# Patient Record
Sex: Male | Born: 1940
Health system: Southern US, Community
[De-identification: ages and names within clinical notes are randomized; demographics above are authoritative.]

## PROBLEM LIST (undated history)

## (undated) DIAGNOSIS — J449 Chronic obstructive pulmonary disease, unspecified: Secondary | ICD-10-CM

## (undated) DIAGNOSIS — I10 Essential (primary) hypertension: Secondary | ICD-10-CM

## (undated) DIAGNOSIS — J45909 Unspecified asthma, uncomplicated: Secondary | ICD-10-CM

## (undated) DIAGNOSIS — R918 Other nonspecific abnormal finding of lung field: Secondary | ICD-10-CM

## (undated) DIAGNOSIS — M1990 Unspecified osteoarthritis, unspecified site: Secondary | ICD-10-CM

## (undated) DIAGNOSIS — D509 Iron deficiency anemia, unspecified: Secondary | ICD-10-CM

## (undated) HISTORY — PX: HAMMER TOE SURGERY: SHX385

## (undated) HISTORY — DX: Other nonspecific abnormal finding of lung field: R91.8

## (undated) HISTORY — DX: Chronic obstructive pulmonary disease, unspecified: J44.9

## (undated) HISTORY — DX: Unspecified osteoarthritis, unspecified site: M19.90

## (undated) HISTORY — DX: Iron deficiency anemia, unspecified: D50.9

## (undated) HISTORY — DX: Essential (primary) hypertension: I10

---

## 1993-03-28 HISTORY — PX: ABDOMINAL HERNIA REPAIR: SHX539

## 2004-03-25 ENCOUNTER — Other Ambulatory Visit: Payer: Self-pay

## 2004-03-25 ENCOUNTER — Ambulatory Visit: Payer: Self-pay | Admitting: Unknown Physician Specialty

## 2004-03-30 ENCOUNTER — Ambulatory Visit: Payer: Self-pay | Admitting: Unknown Physician Specialty

## 2004-05-25 ENCOUNTER — Encounter: Payer: Self-pay | Admitting: Physician Assistant

## 2004-05-26 ENCOUNTER — Encounter: Payer: Self-pay | Admitting: Physician Assistant

## 2004-06-26 ENCOUNTER — Encounter: Payer: Self-pay | Admitting: Physician Assistant

## 2004-08-05 ENCOUNTER — Ambulatory Visit: Payer: Self-pay | Admitting: Internal Medicine

## 2005-02-01 ENCOUNTER — Ambulatory Visit: Payer: Self-pay | Admitting: Internal Medicine

## 2005-06-22 ENCOUNTER — Ambulatory Visit: Payer: Self-pay | Admitting: Internal Medicine

## 2005-12-23 ENCOUNTER — Ambulatory Visit: Payer: Self-pay

## 2006-02-06 ENCOUNTER — Ambulatory Visit: Payer: Self-pay | Admitting: Internal Medicine

## 2007-04-11 ENCOUNTER — Emergency Department: Payer: Self-pay | Admitting: Emergency Medicine

## 2008-01-02 ENCOUNTER — Other Ambulatory Visit: Payer: Self-pay

## 2008-01-02 ENCOUNTER — Ambulatory Visit: Payer: Self-pay | Admitting: Ophthalmology

## 2008-01-15 ENCOUNTER — Ambulatory Visit: Payer: Self-pay | Admitting: Ophthalmology

## 2008-06-24 ENCOUNTER — Ambulatory Visit: Payer: Self-pay | Admitting: Gastroenterology

## 2009-02-06 ENCOUNTER — Ambulatory Visit: Payer: Self-pay | Admitting: Family

## 2009-05-15 ENCOUNTER — Ambulatory Visit: Payer: Self-pay | Admitting: Internal Medicine

## 2009-05-28 ENCOUNTER — Ambulatory Visit: Payer: Self-pay | Admitting: Podiatry

## 2009-06-05 ENCOUNTER — Ambulatory Visit: Payer: Self-pay | Admitting: Podiatry

## 2009-12-06 LAB — HM COLONOSCOPY: HM Colonoscopy: NORMAL

## 2010-04-22 ENCOUNTER — Ambulatory Visit: Payer: Self-pay | Admitting: Internal Medicine

## 2010-05-18 ENCOUNTER — Inpatient Hospital Stay: Payer: Self-pay | Admitting: Podiatry

## 2010-05-18 ENCOUNTER — Other Ambulatory Visit: Payer: Self-pay | Admitting: Podiatry

## 2010-05-22 LAB — PATHOLOGY REPORT

## 2010-09-13 ENCOUNTER — Ambulatory Visit: Payer: Self-pay | Admitting: Podiatry

## 2010-12-24 ENCOUNTER — Other Ambulatory Visit: Payer: Self-pay | Admitting: Internal Medicine

## 2010-12-24 NOTE — Telephone Encounter (Signed)
Called Rx in and advised pt.

## 2010-12-24 NOTE — Telephone Encounter (Signed)
Called in Rx to [facility] [address].

## 2010-12-24 NOTE — Telephone Encounter (Signed)
Fine to call in these refills for him.

## 2011-02-02 ENCOUNTER — Other Ambulatory Visit: Payer: Self-pay | Admitting: Internal Medicine

## 2011-02-08 ENCOUNTER — Ambulatory Visit: Payer: Self-pay | Admitting: Ophthalmology

## 2011-02-14 ENCOUNTER — Ambulatory Visit: Payer: Self-pay | Admitting: Ophthalmology

## 2011-04-05 DIAGNOSIS — L97509 Non-pressure chronic ulcer of other part of unspecified foot with unspecified severity: Secondary | ICD-10-CM | POA: Diagnosis not present

## 2011-04-05 DIAGNOSIS — G5790 Unspecified mononeuropathy of unspecified lower limb: Secondary | ICD-10-CM | POA: Diagnosis not present

## 2011-04-28 ENCOUNTER — Inpatient Hospital Stay: Payer: Self-pay | Admitting: Internal Medicine

## 2011-04-28 DIAGNOSIS — R0989 Other specified symptoms and signs involving the circulatory and respiratory systems: Secondary | ICD-10-CM | POA: Diagnosis not present

## 2011-04-28 DIAGNOSIS — R0609 Other forms of dyspnea: Secondary | ICD-10-CM | POA: Diagnosis not present

## 2011-04-28 DIAGNOSIS — R0602 Shortness of breath: Secondary | ICD-10-CM | POA: Diagnosis not present

## 2011-04-28 DIAGNOSIS — R6889 Other general symptoms and signs: Secondary | ICD-10-CM | POA: Diagnosis not present

## 2011-04-28 DIAGNOSIS — R0902 Hypoxemia: Secondary | ICD-10-CM | POA: Diagnosis not present

## 2011-04-28 DIAGNOSIS — M542 Cervicalgia: Secondary | ICD-10-CM | POA: Diagnosis not present

## 2011-04-28 DIAGNOSIS — J438 Other emphysema: Secondary | ICD-10-CM | POA: Diagnosis not present

## 2011-04-28 DIAGNOSIS — J449 Chronic obstructive pulmonary disease, unspecified: Secondary | ICD-10-CM | POA: Diagnosis not present

## 2011-04-28 DIAGNOSIS — J9620 Acute and chronic respiratory failure, unspecified whether with hypoxia or hypercapnia: Secondary | ICD-10-CM | POA: Diagnosis not present

## 2011-04-28 DIAGNOSIS — J40 Bronchitis, not specified as acute or chronic: Secondary | ICD-10-CM | POA: Diagnosis not present

## 2011-04-28 LAB — CBC
HCT: 33.9 % — ABNORMAL LOW (ref 40.0–52.0)
MCV: 78 fL — ABNORMAL LOW (ref 80–100)
RBC: 4.37 10*6/uL — ABNORMAL LOW (ref 4.40–5.90)
RDW: 18.1 % — ABNORMAL HIGH (ref 11.5–14.5)
WBC: 12.1 10*3/uL — ABNORMAL HIGH (ref 3.8–10.6)

## 2011-04-28 LAB — COMPREHENSIVE METABOLIC PANEL
Albumin: 3.2 g/dL — ABNORMAL LOW (ref 3.4–5.0)
BUN: 20 mg/dL — ABNORMAL HIGH (ref 7–18)
Bilirubin,Total: 0.2 mg/dL (ref 0.2–1.0)
Chloride: 103 mmol/L (ref 98–107)
Co2: 28 mmol/L (ref 21–32)
Creatinine: 0.96 mg/dL (ref 0.60–1.30)
EGFR (African American): >60
Glucose: 87 mg/dL (ref 65–99)
Osmolality: 281 (ref 275–301)
Potassium: 3.8 mmol/L (ref 3.5–5.1)
Sodium: 140 mmol/L (ref 136–145)
Total Protein: 8.3 g/dL — ABNORMAL HIGH (ref 6.4–8.2)

## 2011-04-28 LAB — CK TOTAL AND CKMB (NOT AT ARMC): CK-MB: 1.3 ng/mL (ref 0.5–3.6)

## 2011-04-29 DIAGNOSIS — J9620 Acute and chronic respiratory failure, unspecified whether with hypoxia or hypercapnia: Secondary | ICD-10-CM | POA: Diagnosis not present

## 2011-04-29 DIAGNOSIS — R0902 Hypoxemia: Secondary | ICD-10-CM | POA: Diagnosis not present

## 2011-04-29 DIAGNOSIS — J40 Bronchitis, not specified as acute or chronic: Secondary | ICD-10-CM | POA: Diagnosis not present

## 2011-04-29 DIAGNOSIS — J438 Other emphysema: Secondary | ICD-10-CM | POA: Diagnosis not present

## 2011-04-30 DIAGNOSIS — J9620 Acute and chronic respiratory failure, unspecified whether with hypoxia or hypercapnia: Secondary | ICD-10-CM | POA: Diagnosis not present

## 2011-04-30 DIAGNOSIS — J40 Bronchitis, not specified as acute or chronic: Secondary | ICD-10-CM | POA: Diagnosis not present

## 2011-04-30 DIAGNOSIS — R0902 Hypoxemia: Secondary | ICD-10-CM | POA: Diagnosis not present

## 2011-04-30 DIAGNOSIS — I10 Essential (primary) hypertension: Secondary | ICD-10-CM | POA: Diagnosis not present

## 2011-04-30 DIAGNOSIS — I509 Heart failure, unspecified: Secondary | ICD-10-CM | POA: Diagnosis not present

## 2011-04-30 DIAGNOSIS — J441 Chronic obstructive pulmonary disease with (acute) exacerbation: Secondary | ICD-10-CM | POA: Diagnosis not present

## 2011-04-30 LAB — CBC WITH DIFFERENTIAL/PLATELET
Basophil #: 0 10*3/uL (ref 0.0–0.1)
Eosinophil #: 0 10*3/uL (ref 0.0–0.7)
Eosinophil %: 0 %
HCT: 28.4 % — ABNORMAL LOW (ref 40.0–52.0)
Lymphocyte #: 0.6 10*3/uL — ABNORMAL LOW (ref 1.0–3.6)
Lymphocyte %: 4.8 %
MCV: 78 fL — ABNORMAL LOW (ref 80–100)
Monocyte #: 0.2 10*3/uL (ref 0.0–0.7)
Monocyte %: 1.8 %
Neutrophil %: 93.4 %
Platelet: 288 10*3/uL (ref 150–440)
RBC: 3.66 10*6/uL — ABNORMAL LOW (ref 4.40–5.90)
RDW: 18.1 % — ABNORMAL HIGH (ref 11.5–14.5)
WBC: 11.9 10*3/uL — ABNORMAL HIGH (ref 3.8–10.6)

## 2011-04-30 LAB — BASIC METABOLIC PANEL
BUN: 19 mg/dL — ABNORMAL HIGH (ref 7–18)
Calcium, Total: 9.2 mg/dL (ref 8.5–10.1)
Chloride: 104 mmol/L (ref 98–107)
Co2: 29 mmol/L (ref 21–32)
Creatinine: 0.91 mg/dL (ref 0.60–1.30)
Potassium: 4.3 mmol/L (ref 3.5–5.1)
Sodium: 140 mmol/L (ref 136–145)

## 2011-04-30 LAB — IRON AND TIBC
Iron Bind.Cap.(Total): 441 ug/dL (ref 250–450)
Iron Saturation: 5 %
Iron: 21 ug/dL — ABNORMAL LOW (ref 65–175)
Unbound Iron-Bind.Cap.: 420 ug/dL

## 2011-04-30 LAB — FERRITIN: Ferritin (ARMC): 26 ng/mL (ref 8–388)

## 2011-05-01 DIAGNOSIS — J441 Chronic obstructive pulmonary disease with (acute) exacerbation: Secondary | ICD-10-CM | POA: Diagnosis not present

## 2011-05-01 DIAGNOSIS — I10 Essential (primary) hypertension: Secondary | ICD-10-CM | POA: Diagnosis not present

## 2011-05-01 DIAGNOSIS — J9620 Acute and chronic respiratory failure, unspecified whether with hypoxia or hypercapnia: Secondary | ICD-10-CM | POA: Diagnosis not present

## 2011-05-01 DIAGNOSIS — J40 Bronchitis, not specified as acute or chronic: Secondary | ICD-10-CM | POA: Diagnosis not present

## 2011-05-01 DIAGNOSIS — R0902 Hypoxemia: Secondary | ICD-10-CM | POA: Diagnosis not present

## 2011-05-03 LAB — CULTURE, BLOOD (SINGLE)

## 2011-05-03 LAB — EXPECTORATED SPUTUM ASSESSMENT W GRAM STAIN, RFLX TO RESP C

## 2011-05-05 DIAGNOSIS — J449 Chronic obstructive pulmonary disease, unspecified: Secondary | ICD-10-CM | POA: Diagnosis not present

## 2011-05-05 DIAGNOSIS — J438 Other emphysema: Secondary | ICD-10-CM | POA: Diagnosis not present

## 2011-05-05 DIAGNOSIS — G4710 Hypersomnia, unspecified: Secondary | ICD-10-CM | POA: Diagnosis not present

## 2011-05-05 DIAGNOSIS — F17200 Nicotine dependence, unspecified, uncomplicated: Secondary | ICD-10-CM | POA: Diagnosis not present

## 2011-05-05 DIAGNOSIS — J9610 Chronic respiratory failure, unspecified whether with hypoxia or hypercapnia: Secondary | ICD-10-CM | POA: Diagnosis not present

## 2011-05-16 ENCOUNTER — Other Ambulatory Visit: Payer: Self-pay | Admitting: Internal Medicine

## 2011-05-30 DIAGNOSIS — J449 Chronic obstructive pulmonary disease, unspecified: Secondary | ICD-10-CM | POA: Diagnosis not present

## 2011-05-30 DIAGNOSIS — J9610 Chronic respiratory failure, unspecified whether with hypoxia or hypercapnia: Secondary | ICD-10-CM | POA: Diagnosis not present

## 2011-05-30 DIAGNOSIS — F17200 Nicotine dependence, unspecified, uncomplicated: Secondary | ICD-10-CM | POA: Diagnosis not present

## 2011-05-30 DIAGNOSIS — G4710 Hypersomnia, unspecified: Secondary | ICD-10-CM | POA: Diagnosis not present

## 2011-05-30 DIAGNOSIS — J438 Other emphysema: Secondary | ICD-10-CM | POA: Diagnosis not present

## 2011-06-03 DIAGNOSIS — R350 Frequency of micturition: Secondary | ICD-10-CM | POA: Diagnosis not present

## 2011-06-03 DIAGNOSIS — M069 Rheumatoid arthritis, unspecified: Secondary | ICD-10-CM | POA: Diagnosis not present

## 2011-06-03 DIAGNOSIS — J449 Chronic obstructive pulmonary disease, unspecified: Secondary | ICD-10-CM | POA: Diagnosis not present

## 2011-06-03 DIAGNOSIS — N390 Urinary tract infection, site not specified: Secondary | ICD-10-CM | POA: Diagnosis not present

## 2011-06-07 ENCOUNTER — Encounter: Payer: Self-pay | Admitting: Internal Medicine

## 2011-06-07 ENCOUNTER — Ambulatory Visit (INDEPENDENT_AMBULATORY_CARE_PROVIDER_SITE_OTHER): Payer: Medicare Other | Admitting: Internal Medicine

## 2011-06-07 VITALS — BP 90/50 | HR 89 | Temp 98.4°F | Ht 68.0 in | Wt 162.0 lb

## 2011-06-07 DIAGNOSIS — R109 Unspecified abdominal pain: Secondary | ICD-10-CM

## 2011-06-07 DIAGNOSIS — J449 Chronic obstructive pulmonary disease, unspecified: Secondary | ICD-10-CM | POA: Insufficient documentation

## 2011-06-07 DIAGNOSIS — M069 Rheumatoid arthritis, unspecified: Secondary | ICD-10-CM

## 2011-06-07 DIAGNOSIS — N401 Enlarged prostate with lower urinary tract symptoms: Secondary | ICD-10-CM | POA: Diagnosis not present

## 2011-06-07 DIAGNOSIS — D510 Vitamin B12 deficiency anemia due to intrinsic factor deficiency: Secondary | ICD-10-CM | POA: Diagnosis not present

## 2011-06-07 DIAGNOSIS — E039 Hypothyroidism, unspecified: Secondary | ICD-10-CM

## 2011-06-07 DIAGNOSIS — R350 Frequency of micturition: Secondary | ICD-10-CM

## 2011-06-07 DIAGNOSIS — I10 Essential (primary) hypertension: Secondary | ICD-10-CM | POA: Insufficient documentation

## 2011-06-07 LAB — CBC WITH DIFFERENTIAL/PLATELET
Basophils Relative: 0.4 % (ref 0.0–3.0)
Eosinophils Relative: 0.7 % (ref 0.0–5.0)
Hemoglobin: 10.3 g/dL — ABNORMAL LOW (ref 13.0–17.0)
Lymphocytes Relative: 12.4 % (ref 12.0–46.0)
MCV: 76.6 fl — ABNORMAL LOW (ref 78.0–100.0)
Monocytes Absolute: 1 10*3/uL (ref 0.1–1.0)
Neutro Abs: 7.5 10*3/uL (ref 1.4–7.7)
Neutrophils Relative %: 76.6 % (ref 43.0–77.0)
RBC: 4.19 Mil/uL — ABNORMAL LOW (ref 4.22–5.81)
WBC: 9.8 10*3/uL (ref 4.5–10.5)

## 2011-06-07 LAB — TSH: TSH: 1.41 u[IU]/mL (ref 0.35–5.50)

## 2011-06-07 LAB — VITAMIN B12: Vitamin B-12: 284 pg/mL (ref 211–911)

## 2011-06-07 LAB — COMPREHENSIVE METABOLIC PANEL
Albumin: 3.3 g/dL — ABNORMAL LOW (ref 3.5–5.2)
BUN: 20 mg/dL (ref 6–23)
Calcium: 9.2 mg/dL (ref 8.4–10.5)
Chloride: 99 mEq/L (ref 96–112)
Creatinine, Ser: 0.8 mg/dL (ref 0.4–1.5)
Glucose, Bld: 125 mg/dL — ABNORMAL HIGH (ref 70–99)
Potassium: 3.9 mEq/L (ref 3.5–5.1)

## 2011-06-07 MED ORDER — ATENOLOL 12.5 MG HALF TABLET: 12.5000 mg | ORAL_TABLET | Freq: Every day | ORAL | Status: DC

## 2011-06-07 MED ORDER — PREDNISONE (PAK) 10 MG PO TABS: ORAL_TABLET | ORAL | Status: AC

## 2011-06-07 NOTE — Assessment & Plan Note (Signed)
Patient reports pain has resolved. Will recheck urine today. If any blood in the urine, would like to get CT of the abdomen to look for renal stone.

## 2011-06-07 NOTE — Assessment & Plan Note (Signed)
Patient with rheumatoid arthritis. Has significant swelling in his MTP joints in his hands. Will treat with prednisone burst. He has followup with his rheumatologist next month.

## 2011-06-07 NOTE — Assessment & Plan Note (Signed)
Congratulated him on smoking cessation. We'll continue current medications. Will continue home oxygen. Followup 2 weeks.

## 2011-06-07 NOTE — Patient Instructions (Signed)
Decrease Atenolol to 12.5mg  (half tablet) daily.  Follow up 2 weeks.

## 2011-06-07 NOTE — Progress Notes (Signed)
Subjective:    Patient ID: Peter Johnston, male    DOB: Jun 05, 1940, 71 y.o.   MRN: [id number]  HPI 71 year old male with history of COPD, rheumatoid arthritis, chronic lower extremity ulcers, hypertension presents for followup. He reports that he has recently been suffering from left-sided flank and lower back pain. He was seen at urgent care and was started on doxycycline for suspected urinary tract infection. He reports complete resolution of his symptoms with this. He has not taken pain medication for this.  In regards to his COPD, he notes that he recently quit smoking. He reports full compliance with his inhalers. He reports that he is doing well using 3 L of oxygen 24 hours per day at home.  He is concerned today because he sometimes has swelling in both of his lower extremities. He denies pain in his lower extremities. He is not currently taking any diuretics. He does have compression stockings at home but does not wear these on a regular basis.  In regards to his rheumatoid arthritis, he notes he has been taking Plaquenil only once daily. He reports worsening pain in the joints of his hands. This is been going on for the last couple of weeks. He noted some improvement while on steroids during his recent hospitalization. He denies fever or chills.  Outpatient Encounter Prescriptions as of 06/07/2011  Medication Sig Dispense Refill  . allopurinol (ZYLOPRIM) 300 MG tablet Take 300 mg by mouth daily.      Marland Kitchen atenolol (TENORMIN) 12.5 mg TABS Take 0.5 tablets (12.5 mg total) by mouth daily.  90 tablet  5  . esomeprazole (NEXIUM) 40 MG capsule Take 40 mg by mouth daily before breakfast.      . Fluticasone-Salmeterol (ADVAIR) 250-50 MCG/DOSE AEPB Inhale 1 puff into the lungs every 12 (twelve) hours.      . hydroxychloroquine (PLAQUENIL) 200 MG tablet Take 200 mg by mouth daily.      Marland Kitchen ipratropium (ATROVENT) 0.02 % nebulizer solution Take 500 mcg by nebulization 2 (two) times daily as needed.      .  theophylline (THEO-24) 100 MG 24 hr capsule Take 100 mg by mouth daily.      Marland Kitchen tiotropium (SPIRIVA) 18 MCG inhalation capsule Place 18 mcg into inhaler and inhale daily.      . traMADol (ULTRAM) 50 MG tablet Take 50 mg by mouth every 6 (six) hours as needed.      . TRICOR 145 MG tablet TAKE 1 TABLET DAILY  90 tablet  2  . DISCONTD: atenolol (TENORMIN) 25 MG tablet TAKE 1 TABLET DAILY  90 tablet  5  . predniSONE (STERAPRED UNI-PAK) 10 MG tablet Take 60mg  day 1 then taper by 10mg  daily  21 tablet  0    Review of Systems  Constitutional: Positive for fatigue. Negative for fever, chills, activity change, appetite change and unexpected weight change.  Eyes: Negative for visual disturbance.  Respiratory: Positive for cough and shortness of breath.   Cardiovascular: Positive for leg swelling. Negative for chest pain and palpitations.  Gastrointestinal: Positive for abdominal pain. Negative for abdominal distention.  Genitourinary: Positive for flank pain. Negative for dysuria, urgency and difficulty urinating.  Musculoskeletal: Negative for arthralgias and gait problem.  Skin: Negative for color change and rash.  Hematological: Negative for adenopathy.  Psychiatric/Behavioral: Negative for sleep disturbance and dysphoric mood. The patient is not nervous/anxious.    BP 90/50  Pulse 89  Temp(Src) 98.4 F (36.9 C) (Oral)  Ht 5\' 8"  (1.727 m)  Wt 162 lb (73.483 kg)  BMI 24.63 kg/m2  SpO2 93%     Objective:   Physical Exam  Constitutional: He is oriented to person, place, and time. He appears well-developed and well-nourished. No distress.  HENT:  Head: Normocephalic and atraumatic.  Right Ear: External ear normal.  Left Ear: External ear normal.  Nose: Nose normal.  Mouth/Throat: Oropharynx is clear and moist. No oropharyngeal exudate.  Eyes: Conjunctivae and EOM are normal. Pupils are equal, round, and reactive to light. Right eye exhibits no discharge. Left eye exhibits no discharge. No  scleral icterus.  Neck: Normal range of motion. Neck supple. No tracheal deviation present. No thyromegaly present.  Cardiovascular: Normal rate, regular rhythm and normal heart sounds.  Exam reveals no gallop and no friction rub.   No murmur heard. Pulmonary/Chest: Effort normal. No accessory muscle usage. Not tachypneic. No respiratory distress. He has decreased breath sounds. He has no wheezes. He has no rhonchi. He has no rales. He exhibits no tenderness.  Abdominal: There is no CVA tenderness.  Musculoskeletal: Normal range of motion. He exhibits edema (trace bilateral LE to mid shin).  Lymphadenopathy:    He has no cervical adenopathy.  Neurological: He is alert and oriented to person, place, and time. No cranial nerve deficit. Coordination normal.  Skin: Skin is warm and dry. No rash noted. He is not diaphoretic. No erythema. No pallor.  Psychiatric: He has a normal mood and affect. His behavior is normal. Judgment and thought content normal.          Assessment & Plan:

## 2011-06-07 NOTE — Assessment & Plan Note (Signed)
Patient is hypotensive today. We'll reduce dose of atenolol to 12.5 mg daily. We'll check renal function with labs today. He will followup in 2 weeks.

## 2011-06-08 DIAGNOSIS — R109 Unspecified abdominal pain: Secondary | ICD-10-CM | POA: Diagnosis not present

## 2011-06-08 LAB — POCT URINALYSIS DIPSTICK
Glucose, UA: NEGATIVE
Leukocytes, UA: NEGATIVE
Nitrite, UA: NEGATIVE

## 2011-06-08 NOTE — Progress Notes (Signed)
Addended by: Jobie Quaker on: 06/08/2011 05:04 PM   Modules accepted: Orders

## 2011-06-23 ENCOUNTER — Telehealth: Payer: Self-pay | Admitting: Internal Medicine

## 2011-06-23 NOTE — Telephone Encounter (Signed)
We should set him up a visit to discuss that.

## 2011-06-23 NOTE — Telephone Encounter (Signed)
Patient is needing more prednisone wanting to know if he can be on it on a regular basis at a lower dosage to help with his breathing and arthritis .

## 2011-06-24 ENCOUNTER — Other Ambulatory Visit: Payer: Self-pay | Admitting: Internal Medicine

## 2011-06-27 NOTE — Telephone Encounter (Signed)
MD already refused request by pharm w/mess that pharm needs an apt. [place] Noe, can you help get pt set up for f/u OV to discuss this request? THANKS!!!!!

## 2011-06-28 NOTE — Telephone Encounter (Signed)
Left a message for the patient to call the office to set up an appointment to discuss him being on prednisone on a regular basis.

## 2011-06-28 NOTE — Telephone Encounter (Signed)
Patient will be seen on 4.3.13 @ 8:15 . Patient also needs to sign a DPR.

## 2011-06-29 ENCOUNTER — Encounter: Payer: Self-pay | Admitting: Internal Medicine

## 2011-06-29 ENCOUNTER — Ambulatory Visit (INDEPENDENT_AMBULATORY_CARE_PROVIDER_SITE_OTHER): Payer: Medicare Other | Admitting: Internal Medicine

## 2011-06-29 ENCOUNTER — Telehealth: Payer: Self-pay | Admitting: Internal Medicine

## 2011-06-29 VITALS — BP 110/60 | HR 96 | Temp 97.9°F | Ht 68.0 in | Wt 159.0 lb

## 2011-06-29 DIAGNOSIS — M069 Rheumatoid arthritis, unspecified: Secondary | ICD-10-CM

## 2011-06-29 DIAGNOSIS — D509 Iron deficiency anemia, unspecified: Secondary | ICD-10-CM

## 2011-06-29 DIAGNOSIS — J449 Chronic obstructive pulmonary disease, unspecified: Secondary | ICD-10-CM

## 2011-06-29 DIAGNOSIS — K219 Gastro-esophageal reflux disease without esophagitis: Secondary | ICD-10-CM

## 2011-06-29 DIAGNOSIS — N401 Enlarged prostate with lower urinary tract symptoms: Secondary | ICD-10-CM

## 2011-06-29 MED ORDER — PREDNISONE 10 MG PO TABS: ORAL_TABLET | ORAL | Status: DC

## 2011-06-29 MED ORDER — OMEPRAZOLE 40 MG PO CPDR: 40.0000 mg | DELAYED_RELEASE_CAPSULE | Freq: Every day | ORAL | Status: DC

## 2011-06-29 NOTE — Assessment & Plan Note (Signed)
Persistent pain in joints of hands, knees and ankles.  Some improvement with prednisone, but likely needs longer taper for better control. We discussed significant risks of long term prednisone use. He will need to resume PPI. Unable to afford Nexium, so will start Omeprazole.  Has follow up with rheumatologist next month.

## 2011-06-29 NOTE — Assessment & Plan Note (Signed)
Patient is not taking Nexium because of cost. Will change to omeprazole 40 mg daily. As below, will set up followup with GI medicine.

## 2011-06-29 NOTE — Assessment & Plan Note (Signed)
Symptoms stable. Has follow up with pulmonologist tomorrow.

## 2011-06-29 NOTE — Assessment & Plan Note (Signed)
Stool Hemoccult is pending. Will set up with GI for evaluation likely to include upper endoscopy given patient's history of gastric ulcer and chronic use of prednisone, he is at high risk for recurrence.

## 2011-06-29 NOTE — Assessment & Plan Note (Signed)
Symptoms worsening. PSA was normal. Will set up with urology for follow up.

## 2011-06-29 NOTE — Progress Notes (Signed)
Subjective:    Patient ID: Peter Johnston, male    DOB: [birth year], 71 y.o.   MRN: [id number]  HPI 71 year old male with history of COPD, GERD and gastric ulcer, iron deficiency anemia, and rheumatoid arthritis presents for followup. He reports significant pain in the joints of his hands, ankles, and knees. This is consistent with his chronic history of rheumatoid arthritis and a recent flare. He reports some improvement with use of prednisone in the past and would like to restart this medication. He notes that he was on long-term prednisone in the past. He is currently having trouble functioning and caring for himself because of severe pain in his joints.  In regards to his COPD, he reports that his symptoms have been stable. He continues to have cough productive of large amount of sputum in the mornings. He denies any change in shortness of breath or wheezing. Notably, he stopped his theophylline.  In regards to his GERD, he notes he is no longer taking Nexium because of cost. He would like to change to another medication. He notes worsening of his symptoms including epigastric pain and reflux. He notes a history of bleeding gastric ulcer in the past. He has not recently had endoscopy. He was noted on labs to have anemia in stool Hemoccult is pending. He has not noted any black stool or change in his stool pattern. He does report generalized fatigue.  Outpatient Encounter Prescriptions as of 06/29/2011  Medication Sig Dispense Refill  . allopurinol (ZYLOPRIM) 300 MG tablet Take 300 mg by mouth daily.      Marland Kitchen atenolol (TENORMIN) 12.5 mg TABS Take 0.5 tablets (12.5 mg total) by mouth daily.  90 tablet  5  . Fluticasone-Salmeterol (ADVAIR) 250-50 MCG/DOSE AEPB Inhale 1 puff into the lungs every 12 (twelve) hours.      . hydroxychloroquine (PLAQUENIL) 200 MG tablet Take 200 mg by mouth daily.      Marland Kitchen ipratropium (ATROVENT) 0.02 % nebulizer solution Take 500 mcg by nebulization 2 (two) times daily as  needed.      . tiotropium (SPIRIVA) 18 MCG inhalation capsule Place 18 mcg into inhaler and inhale daily.      . traMADol (ULTRAM) 50 MG tablet Take 50 mg by mouth every 6 (six) hours as needed.      . TRICOR 145 MG tablet TAKE 1 TABLET DAILY  90 tablet  2  . omeprazole (PRILOSEC) 40 MG capsule Take 1 capsule (40 mg total) by mouth daily.  30 capsule  3  . predniSONE (DELTASONE) 10 MG tablet Take 20mg  (2 pills) daily x 5 days, then 10mg  daily x 5 days, then 5mg  daily x 10 days. Then, stop.  20 tablet  0  . theophylline (THEO-24) 100 MG 24 hr capsule Take 100 mg by mouth daily.      Marland Kitchen DISCONTD: esomeprazole (NEXIUM) 40 MG capsule Take 40 mg by mouth daily before breakfast.        Review of Systems  Constitutional: Negative for fever, chills, activity change, appetite change, fatigue and unexpected weight change.  Eyes: Negative for visual disturbance.  Respiratory: Positive for cough, shortness of breath and wheezing.   Cardiovascular: Negative for chest pain, palpitations and leg swelling.  Gastrointestinal: Positive for abdominal pain. Negative for nausea, diarrhea, constipation, blood in stool, abdominal distention, anal bleeding and rectal pain.  Genitourinary: Positive for frequency. Negative for dysuria, urgency and difficulty urinating.  Musculoskeletal: Positive for myalgias, back pain, joint swelling and arthralgias. Negative for gait problem.  Skin: Negative for color change and rash.  Hematological: Negative for adenopathy.  Psychiatric/Behavioral: Negative for sleep disturbance and dysphoric mood. The patient is not nervous/anxious.    BP 110/60  Pulse 96  Temp(Src) 97.9 F (36.6 C) (Oral)  Ht 5\' 8"  (1.727 m)  Wt 159 lb (72.122 kg)  BMI 24.18 kg/m2  SpO2 93%     Objective:   Physical Exam  Constitutional: He is oriented to person, place, and time. He appears well-developed and well-nourished. No distress.  HENT:  Head: Normocephalic and atraumatic.  Right Ear: External  ear normal.  Left Ear: External ear normal.  Nose: Nose normal.  Mouth/Throat: Oropharynx is clear and moist. No oropharyngeal exudate.  Eyes: Conjunctivae and EOM are normal. Pupils are equal, round, and reactive to light. Right eye exhibits no discharge. Left eye exhibits no discharge. No scleral icterus.  Neck: Normal range of motion. Neck supple. No tracheal deviation present. No thyromegaly present.  Cardiovascular: Normal rate, regular rhythm and normal heart sounds.  Exam reveals no gallop and no friction rub.   No murmur heard. Pulmonary/Chest: Accessory muscle usage present. Tachypnea noted. No respiratory distress. He has decreased breath sounds. He has no wheezes. He has no rhonchi. He has no rales. He exhibits deformity (barrell chest). He exhibits no tenderness.  Musculoskeletal: He exhibits no edema.       Right hand: He exhibits decreased range of motion, bony tenderness, deformity and swelling.       Left hand: He exhibits decreased range of motion, bony tenderness, deformity and swelling.  Lymphadenopathy:    He has no cervical adenopathy.  Neurological: He is alert and oriented to person, place, and time. No cranial nerve deficit. Coordination normal.  Skin: Skin is warm and dry. No rash noted. He is not diaphoretic. No erythema. No pallor.  Psychiatric: He has a normal mood and affect. His behavior is normal. Judgment and thought content normal.          Assessment & Plan:

## 2011-06-29 NOTE — Telephone Encounter (Signed)
Huntley Dec can you print these out for me and i will fax   [facility] work up last office notes and all labs that we have Please fax to [phone]  ATTN  DIXIE  Thanks

## 2011-06-30 ENCOUNTER — Telehealth: Payer: Self-pay | Admitting: *Deleted

## 2011-06-30 DIAGNOSIS — R0602 Shortness of breath: Secondary | ICD-10-CM | POA: Diagnosis not present

## 2011-06-30 DIAGNOSIS — J449 Chronic obstructive pulmonary disease, unspecified: Secondary | ICD-10-CM | POA: Diagnosis not present

## 2011-06-30 DIAGNOSIS — J9610 Chronic respiratory failure, unspecified whether with hypoxia or hypercapnia: Secondary | ICD-10-CM | POA: Diagnosis not present

## 2011-06-30 DIAGNOSIS — Z9119 Patient's noncompliance with other medical treatment and regimen: Secondary | ICD-10-CM | POA: Diagnosis not present

## 2011-06-30 NOTE — Telephone Encounter (Signed)
Opened in error

## 2011-06-30 NOTE — Telephone Encounter (Signed)
Notes faxed to number provided.

## 2011-07-06 ENCOUNTER — Other Ambulatory Visit: Payer: Medicare Other

## 2011-07-08 ENCOUNTER — Ambulatory Visit: Payer: Self-pay | Admitting: Oncology

## 2011-07-08 DIAGNOSIS — J449 Chronic obstructive pulmonary disease, unspecified: Secondary | ICD-10-CM | POA: Diagnosis not present

## 2011-07-08 DIAGNOSIS — Z87891 Personal history of nicotine dependence: Secondary | ICD-10-CM | POA: Diagnosis not present

## 2011-07-08 DIAGNOSIS — Z79899 Other long term (current) drug therapy: Secondary | ICD-10-CM | POA: Diagnosis not present

## 2011-07-08 DIAGNOSIS — R195 Other fecal abnormalities: Secondary | ICD-10-CM | POA: Diagnosis not present

## 2011-07-08 DIAGNOSIS — M069 Rheumatoid arthritis, unspecified: Secondary | ICD-10-CM | POA: Diagnosis not present

## 2011-07-08 DIAGNOSIS — D649 Anemia, unspecified: Secondary | ICD-10-CM | POA: Diagnosis not present

## 2011-07-08 DIAGNOSIS — N400 Enlarged prostate without lower urinary tract symptoms: Secondary | ICD-10-CM | POA: Diagnosis not present

## 2011-07-08 DIAGNOSIS — K5909 Other constipation: Secondary | ICD-10-CM | POA: Diagnosis not present

## 2011-07-08 DIAGNOSIS — K219 Gastro-esophageal reflux disease without esophagitis: Secondary | ICD-10-CM | POA: Diagnosis not present

## 2011-07-08 DIAGNOSIS — I10 Essential (primary) hypertension: Secondary | ICD-10-CM | POA: Diagnosis not present

## 2011-07-08 DIAGNOSIS — D509 Iron deficiency anemia, unspecified: Secondary | ICD-10-CM | POA: Diagnosis not present

## 2011-07-08 DIAGNOSIS — E7800 Pure hypercholesterolemia, unspecified: Secondary | ICD-10-CM | POA: Diagnosis not present

## 2011-07-08 LAB — CBC CANCER CENTER
Basophil %: 0.5 %
Eosinophil #: 0.1 x10 3/mm (ref 0.0–0.7)
Eosinophil %: 0.4 %
HCT: 35.1 % — ABNORMAL LOW (ref 40.0–52.0)
HGB: 10.8 g/dL — ABNORMAL LOW (ref 13.0–18.0)
Lymphocyte #: 0.7 x10 3/mm — ABNORMAL LOW (ref 1.0–3.6)
Lymphocyte %: 5.7 %
MCH: 23.3 pg — ABNORMAL LOW (ref 26.0–34.0)
MCHC: 30.8 g/dL — ABNORMAL LOW (ref 32.0–36.0)
MCV: 76 fL — ABNORMAL LOW (ref 80–100)
Monocyte #: 0.6 x10 3/mm (ref 0.2–1.0)
Neutrophil #: 11.3 x10 3/mm — ABNORMAL HIGH (ref 1.4–6.5)
Neutrophil %: 89 %
Platelet: 449 x10 3/mm — ABNORMAL HIGH (ref 150–440)
WBC: 12.7 x10 3/mm — ABNORMAL HIGH (ref 3.8–10.6)

## 2011-07-08 LAB — IRON AND TIBC
Iron Bind.Cap.(Total): 483 ug/dL — ABNORMAL HIGH (ref 250–450)
Iron Saturation: 12 %
Unbound Iron-Bind.Cap.: 426 ug/dL

## 2011-07-15 DIAGNOSIS — I10 Essential (primary) hypertension: Secondary | ICD-10-CM | POA: Diagnosis not present

## 2011-07-15 DIAGNOSIS — K219 Gastro-esophageal reflux disease without esophagitis: Secondary | ICD-10-CM | POA: Diagnosis not present

## 2011-07-15 DIAGNOSIS — R195 Other fecal abnormalities: Secondary | ICD-10-CM | POA: Diagnosis not present

## 2011-07-15 DIAGNOSIS — K5909 Other constipation: Secondary | ICD-10-CM | POA: Diagnosis not present

## 2011-07-15 DIAGNOSIS — D509 Iron deficiency anemia, unspecified: Secondary | ICD-10-CM | POA: Diagnosis not present

## 2011-07-15 DIAGNOSIS — E7800 Pure hypercholesterolemia, unspecified: Secondary | ICD-10-CM | POA: Diagnosis not present

## 2011-07-21 ENCOUNTER — Other Ambulatory Visit: Payer: Self-pay | Admitting: *Deleted

## 2011-07-21 DIAGNOSIS — K219 Gastro-esophageal reflux disease without esophagitis: Secondary | ICD-10-CM

## 2011-07-21 MED ORDER — OMEPRAZOLE 40 MG PO CPDR: 40.0000 mg | DELAYED_RELEASE_CAPSULE | Freq: Every day | ORAL | Status: DC

## 2011-07-22 ENCOUNTER — Telehealth: Payer: Self-pay | Admitting: Internal Medicine

## 2011-07-22 NOTE — Telephone Encounter (Signed)
Caller: Sascha/Patient; PCP: Ronna Polio; CB#: [phone]; Call regarding swelling. Ankle, hands swelling, neck and shoulder's feel stiff for "several months." Can raise his arm halfway over his head. Appt sched for 07/25/11 @ 1315 with Dr. Dan Humphreys Edema/Shoulder Non-Injury Protocol. Call back parameters reviewed.

## 2011-07-25 ENCOUNTER — Telehealth: Payer: Self-pay | Admitting: Internal Medicine

## 2011-07-25 ENCOUNTER — Encounter: Payer: Self-pay | Admitting: Internal Medicine

## 2011-07-25 ENCOUNTER — Ambulatory Visit (INDEPENDENT_AMBULATORY_CARE_PROVIDER_SITE_OTHER): Payer: Medicare Other | Admitting: Internal Medicine

## 2011-07-25 VITALS — BP 122/60 | HR 93 | Resp 12

## 2011-07-25 DIAGNOSIS — M069 Rheumatoid arthritis, unspecified: Secondary | ICD-10-CM | POA: Diagnosis not present

## 2011-07-25 DIAGNOSIS — D509 Iron deficiency anemia, unspecified: Secondary | ICD-10-CM | POA: Diagnosis not present

## 2011-07-25 DIAGNOSIS — W010XXA Fall on same level from slipping, tripping and stumbling without subsequent striking against object, initial encounter: Secondary | ICD-10-CM | POA: Diagnosis not present

## 2011-07-25 DIAGNOSIS — J449 Chronic obstructive pulmonary disease, unspecified: Secondary | ICD-10-CM

## 2011-07-25 MED ORDER — PREDNISONE 10 MG PO TABS: ORAL_TABLET | ORAL | Status: DC

## 2011-07-25 NOTE — Progress Notes (Signed)
Subjective:    Patient ID: Peter Johnston, male    DOB: July 18, 1940, 71 y.o.   MRN: [id number]  HPI 71 year old male with history of COPD, rheumatoid arthritis, and iron deficiency anemia presents for followup. In regards to his rheumatoid arthritis, he reports gradual worsening of pain in his joints. This is most severe in his left ankle. He notes swelling and pain in his left ankle which is improved when he takes a course of prednisone. In the past, he had used Embrel, but this medication was stopped because of MRSA infection. He would like to try another medication to help with chronic joint pain.  In regards to his COPD, he reports some improvement since he quit smoking. He continues to have chronic cough which is occasionally productive of white or yellow sputum. He denies chest pain. He continues to use oxygen, typically 2 L 24 hours a day.  Regards to his iron deficiency anemia, his stool is noted to be guaiac-positive and he is set up with GI medicine for evaluation and possible endoscopy. He was recently seen by hematology and had iron infusion because he has been unable to tolerate oral iron supplements.  When walking into the clinic today, the patient tripped falling on a step, landing on his knees. This resulted in a scraping his right knee and skin tear on his left elbow. He also fell forward and scraped his forehead on the ground. He did not have any loss of consciousness.  Outpatient Encounter Prescriptions as of 07/25/2011  Medication Sig Dispense Refill  . allopurinol (ZYLOPRIM) 300 MG tablet Take 300 mg by mouth daily.      Marland Kitchen atenolol (TENORMIN) 12.5 mg TABS Take 0.5 tablets (12.5 mg total) by mouth daily.  90 tablet  5  . Fluticasone-Salmeterol (ADVAIR) 250-50 MCG/DOSE AEPB Inhale 1 puff into the lungs every 12 (twelve) hours.      . hydroxychloroquine (PLAQUENIL) 200 MG tablet Take 200 mg by mouth daily.      Marland Kitchen ipratropium (ATROVENT) 0.02 % nebulizer solution Take 500 mcg by  nebulization 2 (two) times daily as needed.      Marland Kitchen NEXIUM 40 MG capsule       . omeprazole (PRILOSEC) 40 MG capsule Take 1 capsule (40 mg total) by mouth daily.  90 capsule  4  . predniSONE (DELTASONE) 10 MG tablet Take 20mg  (2 pills) daily x 10 days, then 10mg  daily x 10 days, then 5mg  daily x 20 days. Then, stop.  40 tablet  0  . theophylline (THEO-24) 100 MG 24 hr capsule Take 100 mg by mouth daily.      Marland Kitchen tiotropium (SPIRIVA) 18 MCG inhalation capsule Place 18 mcg into inhaler and inhale daily.      . traMADol (ULTRAM) 50 MG tablet Take 50 mg by mouth every 6 (six) hours as needed.      . TRICOR 145 MG tablet TAKE 1 TABLET DAILY  90 tablet  2  . DISCONTD: predniSONE (DELTASONE) 10 MG tablet Take 20mg  (2 pills) daily x 5 days, then 10mg  daily x 5 days, then 5mg  daily x 10 days. Then, stop.  20 tablet  0    Review of Systems  Constitutional: Positive for fatigue. Negative for fever, chills, activity change, appetite change and unexpected weight change.  Eyes: Negative for visual disturbance.  Respiratory: Positive for cough and shortness of breath.   Cardiovascular: Negative for chest pain, palpitations and leg swelling.  Gastrointestinal: Negative for abdominal pain and abdominal distention.  Genitourinary: Negative for dysuria, urgency and difficulty urinating.  Musculoskeletal: Positive for myalgias, arthralgias and gait problem.  Skin: Positive for wound. Negative for color change and rash.  Neurological: Positive for weakness.  Hematological: Negative for adenopathy.  Psychiatric/Behavioral: Negative for sleep disturbance and dysphoric mood. The patient is not nervous/anxious.    BP 122/60  Pulse 93  Resp 12  SpO2 94%     Objective:   Physical Exam  Constitutional: He is oriented to person, place, and time. He appears well-developed and well-nourished. No distress.  HENT:  Head: Normocephalic and atraumatic.  Right Ear: External ear normal.  Left Ear: External ear normal.    Nose: Nose normal.  Mouth/Throat: Oropharynx is clear and moist. No oropharyngeal exudate.  Eyes: Conjunctivae and EOM are normal. Pupils are equal, round, and reactive to light. Right eye exhibits no discharge. Left eye exhibits no discharge. No scleral icterus.  Neck: Normal range of motion. Neck supple. No tracheal deviation present. No thyromegaly present.  Cardiovascular: Normal rate, regular rhythm and normal heart sounds.  Exam reveals no gallop and no friction rub.   No murmur heard. Pulmonary/Chest: Effort normal and breath sounds normal. No respiratory distress. He has no wheezes. He has no rales. He exhibits no tenderness.  Musculoskeletal: Normal range of motion. He exhibits no edema.  Lymphadenopathy:    He has no cervical adenopathy.  Neurological: He is alert and oriented to person, place, and time. No cranial nerve deficit. Coordination normal.  Skin: Skin is warm and dry. Lesion noted. No rash noted. He is not diaphoretic. No erythema. No pallor.     Psychiatric: He has a normal mood and affect. His behavior is normal. Judgment and thought content normal.          Assessment & Plan:

## 2011-07-25 NOTE — Assessment & Plan Note (Signed)
Improved in past with steroid taper. Will restart prolonged steroid taper today. Will try to move up appointment with rheumatology. Question if he might benefit from retrial of Embrel given progressive disease.

## 2011-07-25 NOTE — Assessment & Plan Note (Signed)
Pt fell while walking into office today. Lacerations/scrapes on left elbow, right knee and forehead were cleaned and bandaged. Pt was observed for and had no LOC or other change in mentation.  Will continue to monitor. Weakness in left ankle leading to increased risk of recurrent falls, so will set up with rheumatology for further evaluation. Question if he might benefit from steroid injection left ankle.

## 2011-07-25 NOTE — Assessment & Plan Note (Signed)
Symptoms recently improved with smoking cessation. Will continue inhaled bronchodilators. Follow up 1 month.

## 2011-07-25 NOTE — Assessment & Plan Note (Addendum)
S/p recent iron infusion. Will get notes from hematology. GI evaluation for blood loss in process. Stool was guiac pos, and appt with GI set up this week for endoscopy.

## 2011-07-25 NOTE — Telephone Encounter (Signed)
Dr walker wanted pt seen sooner by dr Lavenia Atlas Appointment date 08/01/11 @ 11:45   [phone] Pt aware

## 2011-07-26 ENCOUNTER — Telehealth: Payer: Self-pay | Admitting: Internal Medicine

## 2011-07-26 NOTE — Telephone Encounter (Signed)
Spoke with patient and he reports he is doing well. No pain from fall yesterday. Fell at home last night, but denies any injury from this. Notes that pain/swelling from arthritis is improved with prednisone.

## 2011-07-27 ENCOUNTER — Ambulatory Visit: Payer: Self-pay | Admitting: Oncology

## 2011-08-01 DIAGNOSIS — R0602 Shortness of breath: Secondary | ICD-10-CM | POA: Diagnosis not present

## 2011-08-01 DIAGNOSIS — R609 Edema, unspecified: Secondary | ICD-10-CM | POA: Diagnosis not present

## 2011-08-01 DIAGNOSIS — R918 Other nonspecific abnormal finding of lung field: Secondary | ICD-10-CM | POA: Diagnosis not present

## 2011-08-01 DIAGNOSIS — M25579 Pain in unspecified ankle and joints of unspecified foot: Secondary | ICD-10-CM | POA: Diagnosis not present

## 2011-08-01 DIAGNOSIS — M7989 Other specified soft tissue disorders: Secondary | ICD-10-CM | POA: Diagnosis not present

## 2011-08-01 DIAGNOSIS — M069 Rheumatoid arthritis, unspecified: Secondary | ICD-10-CM | POA: Diagnosis not present

## 2011-08-03 ENCOUNTER — Ambulatory Visit: Payer: Medicare Other | Admitting: Internal Medicine

## 2011-08-05 NOTE — Progress Notes (Signed)
Patient scheduled on 5.6.13 @ 11:45 with Lelon Frohlich. Patient is aware of appointment.

## 2011-08-18 DIAGNOSIS — M25579 Pain in unspecified ankle and joints of unspecified foot: Secondary | ICD-10-CM | POA: Diagnosis not present

## 2011-08-18 DIAGNOSIS — M069 Rheumatoid arthritis, unspecified: Secondary | ICD-10-CM | POA: Diagnosis not present

## 2011-08-23 DIAGNOSIS — M069 Rheumatoid arthritis, unspecified: Secondary | ICD-10-CM | POA: Diagnosis not present

## 2011-08-23 DIAGNOSIS — M25579 Pain in unspecified ankle and joints of unspecified foot: Secondary | ICD-10-CM | POA: Diagnosis not present

## 2011-08-24 ENCOUNTER — Telehealth: Payer: Self-pay | Admitting: Internal Medicine

## 2011-08-24 ENCOUNTER — Other Ambulatory Visit: Payer: Self-pay | Admitting: Internal Medicine

## 2011-08-24 NOTE — Telephone Encounter (Signed)
We never denied refill for prednisone. I called and spoke with Pharmacy and they said that they never told the patient that and that they have gotten a refill authorization from Dr. Gavin Potters to fill the prednisone. Patient is going to pick that up today. Also wife says that she really wants you to discuss his drinking problem with him tomorrow at his visit.

## 2011-08-24 NOTE — Telephone Encounter (Signed)
Pt called to see why his rx for predisone was denied.  Pt stated he needed this rx   He is completely out  For 2 or 3 days [facility] [address] Please advise

## 2011-08-25 ENCOUNTER — Ambulatory Visit (INDEPENDENT_AMBULATORY_CARE_PROVIDER_SITE_OTHER): Payer: Medicare Other | Admitting: Internal Medicine

## 2011-08-25 ENCOUNTER — Encounter: Payer: Self-pay | Admitting: Internal Medicine

## 2011-08-25 VITALS — BP 118/76 | HR 83 | Temp 98.6°F | Wt 159.5 lb

## 2011-08-25 DIAGNOSIS — M0520 Rheumatoid vasculitis with rheumatoid arthritis of unspecified site: Secondary | ICD-10-CM | POA: Insufficient documentation

## 2011-08-25 DIAGNOSIS — J449 Chronic obstructive pulmonary disease, unspecified: Secondary | ICD-10-CM

## 2011-08-25 DIAGNOSIS — D509 Iron deficiency anemia, unspecified: Secondary | ICD-10-CM | POA: Diagnosis not present

## 2011-08-25 DIAGNOSIS — D649 Anemia, unspecified: Secondary | ICD-10-CM

## 2011-08-25 DIAGNOSIS — M069 Rheumatoid arthritis, unspecified: Secondary | ICD-10-CM

## 2011-08-25 DIAGNOSIS — Z23 Encounter for immunization: Secondary | ICD-10-CM

## 2011-08-25 MED ORDER — ZOSTER VACCINE LIVE 19400 UNT/0.65ML ~~LOC~~ SOLR: 0.6500 mL | Freq: Once | SUBCUTANEOUS | Status: AC

## 2011-08-25 MED ORDER — ZOSTER VACCINE LIVE 19400 UNT/0.65ML ~~LOC~~ SOLR: 0.6500 mL | Freq: Once | SUBCUTANEOUS | Status: DC

## 2011-08-25 NOTE — Assessment & Plan Note (Signed)
Symptomatically improved. Encouraged him to continue with smoking cessation. Encouraged him to continue with bronchodilators and inhaled steroids. Followup in 2 months.

## 2011-08-25 NOTE — Assessment & Plan Note (Signed)
Symptoms improved with use of prednisone. He is also in discussion with his rheumatologist about using biologic agents. Will continue to follow.

## 2011-08-25 NOTE — Progress Notes (Signed)
Subjective:    Patient ID: Peter Johnston, male    DOB: 1940-09-25, 71 y.o.   MRN: [id number]  HPI 71 year old male with history of hypertension, COPD, rheumatoid arthritis, and iron deficiency anemia presents for followup. In regards to his rheumatoid arthritis, he reports that his rheumatologist has started him on daily prednisone. They are also talking about using biologic agents but trying to get insurance approval for this. He reports significant improvement in his joint pain with the use of prednisone.  In regards to his COPD, he reports that his breathing has gradually been improving. He denies any change in chronic shortness of breath or cough. He continues to use oxygen 24 hours per day, 3 L by nasal cannula. He has been free from tobacco use for 5 months. He reports full compliance with his inhalers.  He was recently evaluated for anemia and had stool testing for occult blood. This was positive. He reports that GI evaluation with endoscopy is pending for later this month. He denies any change in energy level, black stools, blood in his stool, nausea, vomiting. He does occasionally have epigastric discomfort which is improved with the use of Prilosec.   Outpatient Encounter Prescriptions as of 08/25/2011  Medication Sig Dispense Refill  . allopurinol (ZYLOPRIM) 300 MG tablet Take 300 mg by mouth daily.      Marland Kitchen atenolol (TENORMIN) 12.5 mg TABS Take 0.5 tablets (12.5 mg total) by mouth daily.  90 tablet  5  . Fluticasone-Salmeterol (ADVAIR) 250-50 MCG/DOSE AEPB Inhale 1 puff into the lungs every 12 (twelve) hours.      . hydroxychloroquine (PLAQUENIL) 200 MG tablet Take 200 mg by mouth daily.      Marland Kitchen ipratropium (ATROVENT) 0.02 % nebulizer solution Take 500 mcg by nebulization 2 (two) times daily as needed.      Marland Kitchen NEXIUM 40 MG capsule       . omeprazole (PRILOSEC) 40 MG capsule Take 1 capsule (40 mg total) by mouth daily.  90 capsule  4  . theophylline (THEO-24) 100 MG 24 hr capsule Take 100  mg by mouth daily.      Marland Kitchen tiotropium (SPIRIVA) 18 MCG inhalation capsule Place 18 mcg into inhaler and inhale daily.      . traMADol (ULTRAM) 50 MG tablet Take 50 mg by mouth every 6 (six) hours as needed.      . TRICOR 145 MG tablet TAKE 1 TABLET DAILY  90 tablet  2  . DISCONTD: predniSONE (DELTASONE) 10 MG tablet Take 20mg  (2 pills) daily x 10 days, then 10mg  daily x 10 days, then 5mg  daily x 20 days. Then, stop.  40 tablet  0  . furosemide (LASIX) 20 MG tablet       . predniSONE (DELTASONE) 5 MG tablet          Review of Systems  Constitutional: Negative for fever, chills, activity change, appetite change, fatigue and unexpected weight change.  Eyes: Negative for visual disturbance.  Respiratory: Positive for cough and shortness of breath.   Cardiovascular: Negative for chest pain, palpitations and leg swelling.  Gastrointestinal: Positive for abdominal pain. Negative for nausea, vomiting, diarrhea, constipation, blood in stool, abdominal distention, anal bleeding and rectal pain.  Genitourinary: Negative for dysuria, urgency and difficulty urinating.  Musculoskeletal: Positive for myalgias, joint swelling, arthralgias and gait problem.  Skin: Negative for color change and rash.  Hematological: Negative for adenopathy.  Psychiatric/Behavioral: Negative for sleep disturbance and dysphoric mood. The patient is not nervous/anxious.  BP 118/76  Pulse 83  Temp(Src) 98.6 F (37 C) (Oral)  Wt 159 lb 8 oz (72.349 kg)  SpO2 91%     Objective:   Physical Exam  Constitutional: He is oriented to person, place, and time. He appears well-developed and well-nourished. No distress.  HENT:  Head: Normocephalic and atraumatic.  Right Ear: External ear normal.  Left Ear: External ear normal.  Nose: Nose normal.  Mouth/Throat: Oropharynx is clear and moist. No oropharyngeal exudate.  Eyes: Conjunctivae and EOM are normal. Pupils are equal, round, and reactive to light. Right eye exhibits no  discharge. Left eye exhibits no discharge. No scleral icterus.  Neck: Normal range of motion. Neck supple. No tracheal deviation present. No thyromegaly present.  Cardiovascular: Normal rate, regular rhythm and normal heart sounds.  Exam reveals no gallop and no friction rub.   No murmur heard. Pulmonary/Chest: Accessory muscle usage present. Not tachypneic. No respiratory distress. He has decreased breath sounds (improved compared to previous). He has wheezes. He has no rales. He exhibits no tenderness.  Musculoskeletal: Normal range of motion. He exhibits no edema.  Lymphadenopathy:    He has no cervical adenopathy.  Neurological: He is alert and oriented to person, place, and time. No cranial nerve deficit. Coordination normal.  Skin: Skin is warm and dry. No rash noted. He is not diaphoretic. No erythema. No pallor.  Psychiatric: He has a normal mood and affect. His behavior is normal. Judgment and thought content normal.          Assessment & Plan:

## 2011-08-25 NOTE — Assessment & Plan Note (Addendum)
Labs consistent with iron deficiency anemia. Stool Hemoccult testing was positive. GI evaluation is pending. Will recheck CBC with labs today.

## 2011-08-26 LAB — CBC WITH DIFFERENTIAL/PLATELET
Basophils Relative: 0.2 % (ref 0.0–3.0)
Eosinophils Relative: 1.2 % (ref 0.0–5.0)
HCT: 36.7 % — ABNORMAL LOW (ref 39.0–52.0)
MCV: 79 fl (ref 78.0–100.0)
Monocytes Absolute: 0.8 10*3/uL (ref 0.1–1.0)
Monocytes Relative: 6.8 % (ref 3.0–12.0)
Neutrophils Relative %: 82.6 % — ABNORMAL HIGH (ref 43.0–77.0)
Platelets: 286 10*3/uL (ref 150.0–400.0)
RBC: 4.65 Mil/uL (ref 4.22–5.81)
WBC: 11.2 10*3/uL — ABNORMAL HIGH (ref 4.5–10.5)

## 2011-08-26 LAB — COMPREHENSIVE METABOLIC PANEL
Albumin: 3.6 g/dL (ref 3.5–5.2)
BUN: 17 mg/dL (ref 6–23)
CO2: 24 mEq/L (ref 19–32)
Calcium: 9.3 mg/dL (ref 8.4–10.5)
GFR: 103.85 mL/min (ref >60.00)
Glucose, Bld: 98 mg/dL (ref 70–99)
Potassium: 4.4 mEq/L (ref 3.5–5.1)
Sodium: 137 mEq/L (ref 135–145)
Total Protein: 7.2 g/dL (ref 6.0–8.3)

## 2011-09-06 ENCOUNTER — Telehealth: Payer: Self-pay | Admitting: Internal Medicine

## 2011-09-06 NOTE — Telephone Encounter (Signed)
Can you set him up for next available appt?

## 2011-09-06 NOTE — Telephone Encounter (Signed)
Caller: Lev/Patient; PCP: Ronna Polio; CB#: [phone];  Call regarding Appt Requested; Intermittent L knee and ankle swelling "for several days" making walking difficult. See in 24 hrs per Edema Protocol. No appts are available. Pls call Mr. Limones.

## 2011-09-08 NOTE — Telephone Encounter (Signed)
Called and left message asking patient to call us to schedule appt.

## 2011-09-09 ENCOUNTER — Ambulatory Visit: Payer: Self-pay | Admitting: Oncology

## 2011-09-09 DIAGNOSIS — K219 Gastro-esophageal reflux disease without esophagitis: Secondary | ICD-10-CM | POA: Diagnosis not present

## 2011-09-09 DIAGNOSIS — I10 Essential (primary) hypertension: Secondary | ICD-10-CM | POA: Diagnosis not present

## 2011-09-09 DIAGNOSIS — M069 Rheumatoid arthritis, unspecified: Secondary | ICD-10-CM | POA: Diagnosis not present

## 2011-09-09 DIAGNOSIS — E7800 Pure hypercholesterolemia, unspecified: Secondary | ICD-10-CM | POA: Diagnosis not present

## 2011-09-09 DIAGNOSIS — Z79899 Other long term (current) drug therapy: Secondary | ICD-10-CM | POA: Diagnosis not present

## 2011-09-09 DIAGNOSIS — J449 Chronic obstructive pulmonary disease, unspecified: Secondary | ICD-10-CM | POA: Diagnosis not present

## 2011-09-09 DIAGNOSIS — M109 Gout, unspecified: Secondary | ICD-10-CM | POA: Diagnosis not present

## 2011-09-09 DIAGNOSIS — K921 Melena: Secondary | ICD-10-CM | POA: Diagnosis not present

## 2011-09-09 DIAGNOSIS — Z87891 Personal history of nicotine dependence: Secondary | ICD-10-CM | POA: Diagnosis not present

## 2011-09-09 DIAGNOSIS — N400 Enlarged prostate without lower urinary tract symptoms: Secondary | ICD-10-CM | POA: Diagnosis not present

## 2011-09-09 DIAGNOSIS — D509 Iron deficiency anemia, unspecified: Secondary | ICD-10-CM | POA: Diagnosis not present

## 2011-09-09 LAB — CBC CANCER CENTER
Basophil #: 0.1 x10 3/mm (ref 0.0–0.1)
Basophil %: 0.7 %
Eosinophil #: 0.1 x10 3/mm (ref 0.0–0.7)
HGB: 11.9 g/dL — ABNORMAL LOW (ref 13.0–18.0)
MCH: 24.8 pg — ABNORMAL LOW (ref 26.0–34.0)
Monocyte %: 6.5 %
Neutrophil %: 85.9 %
RDW: 21.4 % — ABNORMAL HIGH (ref 11.5–14.5)
WBC: 13.3 x10 3/mm — ABNORMAL HIGH (ref 3.8–10.6)

## 2011-09-09 LAB — IRON AND TIBC
Iron Saturation: 6 %
Unbound Iron-Bind.Cap.: 413 ug/dL

## 2011-09-12 NOTE — Telephone Encounter (Signed)
Left another message asking patient to call and schedule appt if he is still having problems with his knee.

## 2011-09-13 DIAGNOSIS — K219 Gastro-esophageal reflux disease without esophagitis: Secondary | ICD-10-CM | POA: Diagnosis not present

## 2011-09-13 DIAGNOSIS — D509 Iron deficiency anemia, unspecified: Secondary | ICD-10-CM | POA: Diagnosis not present

## 2011-09-16 ENCOUNTER — Ambulatory Visit: Payer: Self-pay | Admitting: Gastroenterology

## 2011-09-16 DIAGNOSIS — K219 Gastro-esophageal reflux disease without esophagitis: Secondary | ICD-10-CM | POA: Diagnosis not present

## 2011-09-16 DIAGNOSIS — Z1211 Encounter for screening for malignant neoplasm of colon: Secondary | ICD-10-CM | POA: Diagnosis not present

## 2011-09-16 DIAGNOSIS — D509 Iron deficiency anemia, unspecified: Secondary | ICD-10-CM | POA: Diagnosis not present

## 2011-09-26 ENCOUNTER — Ambulatory Visit: Payer: Self-pay | Admitting: Oncology

## 2011-09-27 ENCOUNTER — Other Ambulatory Visit: Payer: Self-pay | Admitting: Rheumatology

## 2011-09-27 DIAGNOSIS — M25569 Pain in unspecified knee: Secondary | ICD-10-CM | POA: Diagnosis not present

## 2011-09-27 DIAGNOSIS — M069 Rheumatoid arthritis, unspecified: Secondary | ICD-10-CM | POA: Diagnosis not present

## 2011-09-27 LAB — SYNOVIAL CELL COUNT + DIFF, W/ CRYSTALS
Crystals, Joint Fluid: NONE SEEN
Neutrophils: 94 %
Nucleated Cell Count: 34958 /mm3

## 2011-09-28 ENCOUNTER — Ambulatory Visit: Payer: Self-pay | Admitting: Oncology

## 2011-09-28 DIAGNOSIS — J438 Other emphysema: Secondary | ICD-10-CM | POA: Diagnosis not present

## 2011-09-28 DIAGNOSIS — G4710 Hypersomnia, unspecified: Secondary | ICD-10-CM | POA: Diagnosis not present

## 2011-09-28 DIAGNOSIS — R0602 Shortness of breath: Secondary | ICD-10-CM | POA: Diagnosis not present

## 2011-09-28 DIAGNOSIS — J449 Chronic obstructive pulmonary disease, unspecified: Secondary | ICD-10-CM | POA: Diagnosis not present

## 2011-10-07 ENCOUNTER — Other Ambulatory Visit: Payer: Self-pay | Admitting: Internal Medicine

## 2011-10-12 DIAGNOSIS — H4010X0 Unspecified open-angle glaucoma, stage unspecified: Secondary | ICD-10-CM | POA: Diagnosis not present

## 2011-10-20 DIAGNOSIS — M069 Rheumatoid arthritis, unspecified: Secondary | ICD-10-CM | POA: Diagnosis not present

## 2011-10-24 DIAGNOSIS — Z111 Encounter for screening for respiratory tuberculosis: Secondary | ICD-10-CM | POA: Diagnosis not present

## 2011-10-27 ENCOUNTER — Ambulatory Visit: Payer: Medicare Other | Admitting: Internal Medicine

## 2011-11-15 ENCOUNTER — Telehealth: Payer: Self-pay | Admitting: *Deleted

## 2011-11-15 NOTE — Telephone Encounter (Signed)
Received fax from [facility] stating that PA is needed for Omeprazole.  Complete PA online through [employer].  Omeprazole approved from 10/25/2011 through 05/13/2012.  Patient and pharmacy notified.

## 2011-11-24 ENCOUNTER — Encounter: Payer: Self-pay | Admitting: Internal Medicine

## 2011-11-24 ENCOUNTER — Ambulatory Visit (INDEPENDENT_AMBULATORY_CARE_PROVIDER_SITE_OTHER): Payer: Medicare Other | Admitting: Internal Medicine

## 2011-11-24 VITALS — BP 130/80 | HR 91 | Temp 98.3°F | Ht 68.0 in | Wt 160.0 lb

## 2011-11-24 DIAGNOSIS — D509 Iron deficiency anemia, unspecified: Secondary | ICD-10-CM | POA: Diagnosis not present

## 2011-11-24 DIAGNOSIS — J441 Chronic obstructive pulmonary disease with (acute) exacerbation: Secondary | ICD-10-CM | POA: Diagnosis not present

## 2011-11-24 DIAGNOSIS — K219 Gastro-esophageal reflux disease without esophagitis: Secondary | ICD-10-CM | POA: Diagnosis not present

## 2011-11-24 DIAGNOSIS — M069 Rheumatoid arthritis, unspecified: Secondary | ICD-10-CM

## 2011-11-24 MED ORDER — OMEPRAZOLE 40 MG PO CPDR: 40.0000 mg | DELAYED_RELEASE_CAPSULE | Freq: Every day | ORAL | Status: DC

## 2011-11-24 MED ORDER — ALBUTEROL SULFATE (2.5 MG/3ML) 0.083% IN NEBU: 2.5000 mg | INHALATION_SOLUTION | Freq: Four times a day (QID) | RESPIRATORY_TRACT | Status: DC | PRN

## 2011-11-24 MED ORDER — TRAMADOL HCL 50 MG PO TABS: 50.0000 mg | ORAL_TABLET | Freq: Four times a day (QID) | ORAL | Status: DC | PRN

## 2011-11-24 MED ORDER — PREDNISONE (PAK) 10 MG PO TABS: ORAL_TABLET | ORAL | Status: AC

## 2011-11-24 MED ORDER — LEVOFLOXACIN 750 MG PO TABS: 750.0000 mg | ORAL_TABLET | Freq: Every day | ORAL | Status: AC

## 2011-11-24 NOTE — Progress Notes (Signed)
Subjective:    Patient ID: Peter Johnston, male    DOB: May 18, 1940, 71 y.o.   MRN: [id number]  HPI 71 year old male with severe COPD, rheumatoid arthritis, hypertension presents for followup. His primary concern today is 2 or 3 day course of increased work of breathing, shortness of breath, and cough productive of thick purulent sputum. He denies any fever or chills. He denies any chest pain. He has been taking prednisone 5 mg daily and using his inhalers with no improvement.  In regards to recent episode of iron deficiency anemia, he notes he was seen by hematologist and testing was unrevealing as to cause of blood loss. He reports he had upper endoscopy which was normal. He denies any recent change in his bowel habits, blood in the stool, or black stool.  In regards to rheumatoid arthritis, he reports that symptoms are recently improved with use of prednisone. He continues to have some joint pain, particularly in his hands and ankles.  Outpatient Encounter Prescriptions as of 11/24/2011  Medication Sig Dispense Refill  . allopurinol (ZYLOPRIM) 300 MG tablet Take 300 mg by mouth daily.      Marland Kitchen atenolol (TENORMIN) 12.5 mg TABS Take 0.5 tablets (12.5 mg total) by mouth daily.  90 tablet  5  . fenofibrate (TRICOR) 145 MG tablet TAKE 1 TABLET DAILY  90 tablet  1  . Fluticasone-Salmeterol (ADVAIR) 250-50 MCG/DOSE AEPB Inhale 1 puff into the lungs every 12 (twelve) hours.      . furosemide (LASIX) 20 MG tablet       . hydroxychloroquine (PLAQUENIL) 200 MG tablet Take 200 mg by mouth daily.      Marland Kitchen ipratropium (ATROVENT) 0.02 % nebulizer solution Take 500 mcg by nebulization 2 (two) times daily as needed.      Marland Kitchen NEXIUM 40 MG capsule       . predniSONE (DELTASONE) 5 MG tablet       . theophylline (THEO-24) 100 MG 24 hr capsule Take 100 mg by mouth daily.      Marland Kitchen tiotropium (SPIRIVA) 18 MCG inhalation capsule Place 18 mcg into inhaler and inhale daily.      Marland Kitchen DISCONTD: omeprazole (PRILOSEC) 40 MG capsule  Take 1 capsule (40 mg total) by mouth daily.  90 capsule  4  . DISCONTD: traMADol (ULTRAM) 50 MG tablet Take 50 mg by mouth every 6 (six) hours as needed.      Marland Kitchen albuterol (PROVENTIL) (2.5 MG/3ML) 0.083% nebulizer solution Take 3 mLs (2.5 mg total) by nebulization every 6 (six) hours as needed for wheezing.  150 mL  1  . levofloxacin (LEVAQUIN) 750 MG tablet Take 1 tablet (750 mg total) by mouth daily.  7 tablet  0  . omeprazole (PRILOSEC) 40 MG capsule Take 1 capsule (40 mg total) by mouth daily.  90 capsule  4  . predniSONE (STERAPRED UNI-PAK) 10 MG tablet Take 60mg  day 1 then taper by 10mg  daily  21 tablet  0  . traMADol (ULTRAM) 50 MG tablet Take 1 tablet (50 mg total) by mouth every 6 (six) hours as needed.  180 tablet  3   BP 130/80  Pulse 91  Temp 98.3 F (36.8 C) (Oral)  Ht 5\' 8"  (1.727 m)  Wt 160 lb (72.576 kg)  BMI 24.33 kg/m2  SpO2 93%  Review of Systems  Constitutional: Positive for fatigue. Negative for fever, chills and activity change.  HENT: Negative for hearing loss, ear pain, nosebleeds, congestion, sore throat, rhinorrhea, sneezing, trouble swallowing, neck pain,  neck stiffness, voice change, postnasal drip, sinus pressure, tinnitus and ear discharge.   Eyes: Negative for discharge, redness, itching and visual disturbance.  Respiratory: Positive for cough, shortness of breath and wheezing. Negative for chest tightness and stridor.   Cardiovascular: Negative for chest pain and leg swelling.  Musculoskeletal: Positive for myalgias, joint swelling and arthralgias.  Skin: Negative for color change and rash.  Neurological: Negative for dizziness, facial asymmetry and headaches.  Psychiatric/Behavioral: Negative for disturbed wake/sleep cycle.       Objective:   Physical Exam  Constitutional: He is oriented to person, place, and time. He appears well-developed and well-nourished. No distress.  HENT:  Head: Normocephalic and atraumatic.  Right Ear: External ear normal.    Left Ear: External ear normal.  Nose: Nose normal.  Mouth/Throat: Oropharynx is clear and moist. No oropharyngeal exudate.  Eyes: Conjunctivae and EOM are normal. Pupils are equal, round, and reactive to light. Right eye exhibits no discharge. Left eye exhibits no discharge. No scleral icterus.  Neck: Normal range of motion. Neck supple. No tracheal deviation present. No thyromegaly present.  Cardiovascular: Normal rate, regular rhythm and normal heart sounds.  Exam reveals no gallop and no friction rub.   No murmur heard. Pulmonary/Chest: Accessory muscle usage present. Tachypnea noted. No respiratory distress. He has decreased breath sounds (very poor air movement). He has no wheezes. He has no rhonchi. He has no rales. He exhibits no tenderness.  Musculoskeletal: Normal range of motion. He exhibits no edema.  Lymphadenopathy:    He has no cervical adenopathy.  Neurological: He is alert and oriented to person, place, and time. No cranial nerve deficit. Coordination normal.  Skin: Skin is warm and dry. No rash noted. He is not diaphoretic. No erythema. No pallor.  Psychiatric: He has a normal mood and affect. His behavior is normal. Judgment and thought content normal.          Assessment & Plan:

## 2011-11-24 NOTE — Assessment & Plan Note (Signed)
Symptoms improved with low-dose prednisone. Will request notes from his rheumatologist.

## 2011-11-24 NOTE — Assessment & Plan Note (Addendum)
Patient reports that workup has been unrevealing including upper endoscopy. Will get records from hematologist.

## 2011-11-24 NOTE — Assessment & Plan Note (Signed)
Symptoms are consistent with COPD exacerbation. Patient is markedly dyspneic on exam and has very poor air movement noted. Encouraged him to consider hospital admission for IV steroids. He would prefer to try as an outpatient first. Will treat with Levaquin and prednisone taper. He will continue inhaled steroids and bronchodilators. Followup in 2 weeks or sooner if needed.

## 2011-12-08 DIAGNOSIS — J9610 Chronic respiratory failure, unspecified whether with hypoxia or hypercapnia: Secondary | ICD-10-CM | POA: Diagnosis not present

## 2011-12-08 DIAGNOSIS — J449 Chronic obstructive pulmonary disease, unspecified: Secondary | ICD-10-CM | POA: Diagnosis not present

## 2011-12-08 DIAGNOSIS — J438 Other emphysema: Secondary | ICD-10-CM | POA: Diagnosis not present

## 2011-12-12 ENCOUNTER — Ambulatory Visit: Payer: Self-pay | Admitting: Oncology

## 2011-12-12 ENCOUNTER — Ambulatory Visit (INDEPENDENT_AMBULATORY_CARE_PROVIDER_SITE_OTHER): Payer: Medicare Other | Admitting: Internal Medicine

## 2011-12-12 ENCOUNTER — Encounter: Payer: Self-pay | Admitting: Internal Medicine

## 2011-12-12 VITALS — BP 120/68 | HR 79 | Temp 98.2°F | Ht 66.0 in | Wt 162.5 lb

## 2011-12-12 DIAGNOSIS — M109 Gout, unspecified: Secondary | ICD-10-CM | POA: Diagnosis not present

## 2011-12-12 DIAGNOSIS — D509 Iron deficiency anemia, unspecified: Secondary | ICD-10-CM | POA: Diagnosis not present

## 2011-12-12 DIAGNOSIS — N400 Enlarged prostate without lower urinary tract symptoms: Secondary | ICD-10-CM | POA: Diagnosis not present

## 2011-12-12 DIAGNOSIS — E7800 Pure hypercholesterolemia, unspecified: Secondary | ICD-10-CM | POA: Diagnosis not present

## 2011-12-12 DIAGNOSIS — I10 Essential (primary) hypertension: Secondary | ICD-10-CM | POA: Diagnosis not present

## 2011-12-12 DIAGNOSIS — M069 Rheumatoid arthritis, unspecified: Secondary | ICD-10-CM | POA: Diagnosis not present

## 2011-12-12 DIAGNOSIS — J441 Chronic obstructive pulmonary disease with (acute) exacerbation: Secondary | ICD-10-CM

## 2011-12-12 DIAGNOSIS — Z79899 Other long term (current) drug therapy: Secondary | ICD-10-CM | POA: Diagnosis not present

## 2011-12-12 DIAGNOSIS — Z9981 Dependence on supplemental oxygen: Secondary | ICD-10-CM | POA: Diagnosis not present

## 2011-12-12 DIAGNOSIS — K219 Gastro-esophageal reflux disease without esophagitis: Secondary | ICD-10-CM | POA: Diagnosis not present

## 2011-12-12 DIAGNOSIS — J449 Chronic obstructive pulmonary disease, unspecified: Secondary | ICD-10-CM | POA: Diagnosis not present

## 2011-12-12 LAB — CBC CANCER CENTER
Basophil #: 0.2 x10 3/mm — ABNORMAL HIGH (ref 0.0–0.1)
Basophil %: 1.3 %
Eosinophil #: 0.1 x10 3/mm (ref 0.0–0.7)
Eosinophil %: 1.1 %
HCT: 34.9 % — ABNORMAL LOW (ref 40.0–52.0)
HGB: 10.8 g/dL — ABNORMAL LOW (ref 13.0–18.0)
Lymphocyte %: 9.3 %
MCH: 26.1 pg (ref 26.0–34.0)
MCHC: 31 g/dL — ABNORMAL LOW (ref 32.0–36.0)
MCV: 84 fL (ref 80–100)
Monocyte #: 0.7 x10 3/mm (ref 0.2–1.0)
Neutrophil #: 9.9 x10 3/mm — ABNORMAL HIGH (ref 1.4–6.5)
Neutrophil %: 82.8 %
Platelet: 271 x10 3/mm (ref 150–440)
RBC: 4.16 10*6/uL — ABNORMAL LOW (ref 4.40–5.90)
RDW: 17 % — ABNORMAL HIGH (ref 11.5–14.5)

## 2011-12-12 LAB — IRON AND TIBC
Iron Bind.Cap.(Total): 404 ug/dL (ref 250–450)
Iron Saturation: 10 %
Iron: 39 ug/dL — ABNORMAL LOW (ref 65–175)
Unbound Iron-Bind.Cap.: 365 ug/dL

## 2011-12-12 MED ORDER — LEVOFLOXACIN 750 MG PO TABS: 750.0000 mg | ORAL_TABLET | Freq: Every day | ORAL | Status: DC

## 2011-12-12 MED ORDER — PREDNISONE (PAK) 10 MG PO TABS: ORAL_TABLET | ORAL | Status: DC

## 2011-12-12 NOTE — Assessment & Plan Note (Signed)
Symptoms recently worsening with effusion in the left knee and left ankle. Patient has followup with his dermatologist. Will add prednisone taper to help with both COPD exacerbation and exacerbation of rheumatoid arthritis. Followup 2 weeks.

## 2011-12-12 NOTE — Assessment & Plan Note (Signed)
Patient with history of iron deficiency anemia. He reports that he received his second infusion of IV iron today. Will request notes from his hematologist.

## 2011-12-12 NOTE — Progress Notes (Signed)
Subjective:    Patient ID: Peter Johnston, male    DOB: Jun 21, 1940, 71 y.o.   MRN: [id number]  HPI 71 year old male with history of COPD, rheumatoid arthritis, iron deficiency anemia presents for followup. At his last visit 2 weeks ago, he was diagnosed with COPD exacerbation and treated with Levaquin and prednisone. He reports that symptoms improved somewhat then recurred. He currently has shortness of breath at rest and is using 3.5 L of oxygen 24 hours a day. He reports full compliance with his inhalers including Advair, spiriva, and albuterol. He denies fever, chills, chest pain.  He is also concerned today about recent worsening of swelling in his left knee and ankle. He notes he was seen by his rheumatologist and is currently taking prednisone 5 mg daily with no improvement. These joints are painful and occasionally warm to touch. They limit his ability to walk because of pain.  He notes he was recently seen by his hematologist today and given a second infusion of IV iron for iron deficiency anemia. He reports he tolerated this well. He brings record from his hematologist showing hemoglobin was 10 g/dL.  Outpatient Encounter Prescriptions as of 12/12/2011  Medication Sig Dispense Refill  . albuterol (PROVENTIL) (2.5 MG/3ML) 0.083% nebulizer solution Take 3 mLs (2.5 mg total) by nebulization every 6 (six) hours as needed for wheezing.  150 mL  1  . allopurinol (ZYLOPRIM) 300 MG tablet Take 300 mg by mouth daily.      Marland Kitchen atenolol (TENORMIN) 12.5 mg TABS Take 0.5 tablets (12.5 mg total) by mouth daily.  90 tablet  5  . fenofibrate (TRICOR) 145 MG tablet TAKE 1 TABLET DAILY  90 tablet  1  . Fluticasone-Salmeterol (ADVAIR) 250-50 MCG/DOSE AEPB Inhale 1 puff into the lungs every 12 (twelve) hours.      . furosemide (LASIX) 20 MG tablet       . hydroxychloroquine (PLAQUENIL) 200 MG tablet Take 200 mg by mouth daily.      Marland Kitchen ipratropium (ATROVENT) 0.02 % nebulizer solution Take 500 mcg by nebulization  2 (two) times daily as needed.      Marland Kitchen NEXIUM 40 MG capsule       . omeprazole (PRILOSEC) 40 MG capsule Take 1 capsule (40 mg total) by mouth daily.  90 capsule  4  . predniSONE (DELTASONE) 5 MG tablet       . tiotropium (SPIRIVA) 18 MCG inhalation capsule Place 18 mcg into inhaler and inhale daily.      . traMADol (ULTRAM) 50 MG tablet Take 1 tablet (50 mg total) by mouth every 6 (six) hours as needed.  180 tablet  3  . levofloxacin (LEVAQUIN) 750 MG tablet Take 1 tablet (750 mg total) by mouth daily.  7 tablet  0  . predniSONE (STERAPRED UNI-PAK) 10 MG tablet Take 60mg  day 1 then taper by 10mg  daily  21 tablet  0  . theophylline (THEO-24) 100 MG 24 hr capsule Take 100 mg by mouth daily.      Marland Kitchen DISCONTD: levofloxacin (LEVAQUIN) 750 MG tablet        BP 120/68  Pulse 79  Temp 98.2 F (36.8 C) (Oral)  Ht 5\' 6"  (1.676 m)  Wt 162 lb 8 oz (73.71 kg)  BMI 26.23 kg/m2  SpO2 95%  Review of Systems  Constitutional: Positive for fatigue. Negative for fever, chills, activity change, appetite change and unexpected weight change.  Eyes: Negative for visual disturbance.  Respiratory: Positive for cough, chest tightness, shortness of  breath and wheezing.   Cardiovascular: Positive for leg swelling. Negative for chest pain and palpitations.  Gastrointestinal: Negative for abdominal pain and abdominal distention.  Genitourinary: Negative for dysuria, urgency and difficulty urinating.  Musculoskeletal: Positive for myalgias, joint swelling, arthralgias and gait problem.  Skin: Negative for color change and rash.  Hematological: Negative for adenopathy.  Psychiatric/Behavioral: Negative for disturbed wake/sleep cycle and dysphoric mood. The patient is not nervous/anxious.        Objective:   Physical Exam  Constitutional: He is oriented to person, place, and time. He appears well-developed and well-nourished. No distress.  HENT:  Head: Normocephalic and atraumatic.  Right Ear: External ear normal.   Left Ear: External ear normal.  Nose: Nose normal.  Mouth/Throat: Oropharynx is clear and moist. No oropharyngeal exudate.  Eyes: Conjunctivae normal and EOM are normal. Pupils are equal, round, and reactive to light. Right eye exhibits no discharge. Left eye exhibits no discharge. No scleral icterus.  Neck: Normal range of motion. Neck supple. No tracheal deviation present. No thyromegaly present.  Cardiovascular: Normal rate, regular rhythm and normal heart sounds.  Exam reveals no gallop and no friction rub.   No murmur heard. Pulmonary/Chest: Accessory muscle usage present. Tachypnea noted. No respiratory distress. He has decreased breath sounds. He has wheezes. He has rhonchi. He has no rales. He exhibits no tenderness.  Musculoskeletal: He exhibits no edema.       Left knee: He exhibits decreased range of motion, swelling and effusion.       Left ankle: He exhibits decreased range of motion and swelling.  Lymphadenopathy:    He has no cervical adenopathy.  Neurological: He is alert and oriented to person, place, and time. No cranial nerve deficit. Coordination normal.  Skin: Skin is warm and dry. No rash noted. He is not diaphoretic. No erythema. No pallor.  Psychiatric: He has a normal mood and affect. His behavior is normal. Judgment and thought content normal.          Assessment & Plan:

## 2011-12-12 NOTE — Assessment & Plan Note (Signed)
Symptoms initially improved with Levaquin and prednisone taper but has now worsened slightly. Exam is remarkable for poor air movement and expiratory wheezing. Will treat with repeat course of prednisone and Levaquin. Patient will followup in 2 weeks.

## 2011-12-15 DIAGNOSIS — M25569 Pain in unspecified knee: Secondary | ICD-10-CM | POA: Diagnosis not present

## 2011-12-15 DIAGNOSIS — M069 Rheumatoid arthritis, unspecified: Secondary | ICD-10-CM | POA: Diagnosis not present

## 2011-12-26 ENCOUNTER — Encounter: Payer: Self-pay | Admitting: Internal Medicine

## 2011-12-26 ENCOUNTER — Ambulatory Visit (INDEPENDENT_AMBULATORY_CARE_PROVIDER_SITE_OTHER): Payer: Medicare Other | Admitting: Internal Medicine

## 2011-12-26 VITALS — BP 110/62 | HR 84 | Temp 98.1°F | Ht 66.5 in | Wt 160.5 lb

## 2011-12-26 DIAGNOSIS — R233 Spontaneous ecchymoses: Secondary | ICD-10-CM | POA: Insufficient documentation

## 2011-12-26 DIAGNOSIS — M069 Rheumatoid arthritis, unspecified: Secondary | ICD-10-CM

## 2011-12-26 DIAGNOSIS — Z23 Encounter for immunization: Secondary | ICD-10-CM

## 2011-12-26 DIAGNOSIS — J449 Chronic obstructive pulmonary disease, unspecified: Secondary | ICD-10-CM

## 2011-12-26 DIAGNOSIS — D509 Iron deficiency anemia, unspecified: Secondary | ICD-10-CM | POA: Diagnosis not present

## 2011-12-26 NOTE — Assessment & Plan Note (Signed)
Symptomatically doing fairly well. Patient reports recent approval for use of of Embrel. Will continue follow up with rheumatology.

## 2011-12-26 NOTE — Assessment & Plan Note (Signed)
Recent hemoglobin was stable at 10. Ferritin was improved at 98. Patient has followup scheduled with hematology to evaluate need for subsequent iron infusions.

## 2011-12-26 NOTE — Assessment & Plan Note (Signed)
Bruising noted both of upper and lower extremities. Likely secondary effect from repeated use of prednisone. Will continue to monitor.

## 2011-12-26 NOTE — Progress Notes (Signed)
Subjective:    Patient ID: Peter Johnston, male    DOB: 09-13-1940, 71 y.o.   MRN: [id number]  HPI 71 year old male with history of COPD, rheumatoid arthritis presents for followup. At his last visit, he was noted to have COPD exacerbation and was treated with Levaquin and prednisone taper. He reports symptoms of shortness of breath and cough has improved significantly. He continues using his inhalers including Advair and Spiriva. He continues on 3L oxygen by nasal cannula. In regards to rheumatoid arthritis, he reports that he was recently approved for [facility] and is planning to start this medication. Symptoms have recently been stable with no flares. He has chronic joint pain, particularly in his bilateral ankles.  He is concerned today about bruising over both of his forearms and legs. He reports that these have occurred frequently for some time. He does not always recall trauma to the area.  Outpatient Encounter Prescriptions as of 12/26/2011  Medication Sig Dispense Refill  . albuterol (PROVENTIL) (2.5 MG/3ML) 0.083% nebulizer solution Take 3 mLs (2.5 mg total) by nebulization every 6 (six) hours as needed for wheezing.  150 mL  1  . allopurinol (ZYLOPRIM) 300 MG tablet Take 300 mg by mouth daily.      Marland Kitchen atenolol (TENORMIN) 12.5 mg TABS Take 0.5 tablets (12.5 mg total) by mouth daily.  90 tablet  5  . fenofibrate (TRICOR) 145 MG tablet TAKE 1 TABLET DAILY  90 tablet  1  . Fluticasone-Salmeterol (ADVAIR) 250-50 MCG/DOSE AEPB Inhale 1 puff into the lungs every 12 (twelve) hours.      . furosemide (LASIX) 20 MG tablet       . hydroxychloroquine (PLAQUENIL) 200 MG tablet Take 200 mg by mouth daily.      Marland Kitchen ipratropium (ATROVENT) 0.02 % nebulizer solution Take 500 mcg by nebulization 2 (two) times daily as needed.      Marland Kitchen NEXIUM 40 MG capsule       . omeprazole (PRILOSEC) 40 MG capsule Take 1 capsule (40 mg total) by mouth daily.  90 capsule  4  . predniSONE (DELTASONE) 5 MG tablet       .  predniSONE (STERAPRED UNI-PAK) 10 MG tablet Take 60mg  day 1 then taper by 10mg  daily  21 tablet  0  . theophylline (THEO-24) 100 MG 24 hr capsule Take 100 mg by mouth daily.      Marland Kitchen tiotropium (SPIRIVA) 18 MCG inhalation capsule Place 18 mcg into inhaler and inhale daily.      . traMADol (ULTRAM) 50 MG tablet Take 1 tablet (50 mg total) by mouth every 6 (six) hours as needed.  180 tablet  3  . DISCONTD: levofloxacin (LEVAQUIN) 750 MG tablet Take 1 tablet (750 mg total) by mouth daily.  7 tablet  0   BP 110/62  Pulse 84  Temp 98.1 F (36.7 C) (Oral)  Ht 5' 6.5" (1.689 m)  Wt 160 lb 8 oz (72.802 kg)  BMI 25.52 kg/m2  SpO2 91%  Review of Systems  Constitutional: Negative for fever, chills, activity change, appetite change, fatigue and unexpected weight change.  Eyes: Negative for visual disturbance.  Respiratory: Positive for shortness of breath. Negative for cough.   Cardiovascular: Negative for chest pain, palpitations and leg swelling.  Gastrointestinal: Negative for abdominal pain and abdominal distention.  Genitourinary: Negative for dysuria, urgency and difficulty urinating.  Musculoskeletal: Positive for arthralgias. Negative for gait problem.  Skin: Positive for color change. Negative for rash.  Hematological: Negative for adenopathy. Bruises/bleeds easily.  Psychiatric/Behavioral: Negative for disturbed wake/sleep cycle and dysphoric mood. The patient is not nervous/anxious.        Objective:   Physical Exam  Constitutional: He is oriented to person, place, and time. He appears well-developed and well-nourished. No distress.  HENT:  Head: Normocephalic and atraumatic.  Right Ear: External ear normal.  Left Ear: External ear normal.  Nose: Nose normal.  Mouth/Throat: Oropharynx is clear and moist. No oropharyngeal exudate.  Eyes: Conjunctivae normal and EOM are normal. Pupils are equal, round, and reactive to light. Right eye exhibits no discharge. Left eye exhibits no  discharge. No scleral icterus.  Neck: Normal range of motion. Neck supple. No tracheal deviation present. No thyromegaly present.  Cardiovascular: Normal rate, regular rhythm and normal heart sounds.  Exam reveals no gallop and no friction rub.   No murmur heard. Pulmonary/Chest: Effort normal. No accessory muscle usage. Not tachypneic. No respiratory distress. He has decreased breath sounds. He has no wheezes. He has no rhonchi. He has no rales. He exhibits no tenderness.  Musculoskeletal: Normal range of motion. He exhibits no edema.  Lymphadenopathy:    He has no cervical adenopathy.  Neurological: He is alert and oriented to person, place, and time. No cranial nerve deficit. Coordination normal.  Skin: Skin is warm and dry. Ecchymosis (over bilateral upper and lower extremities) noted. No rash noted. He is not diaphoretic. No erythema. No pallor.  Psychiatric: He has a normal mood and affect. His behavior is normal. Judgment and thought content normal.          Assessment & Plan:

## 2011-12-26 NOTE — Assessment & Plan Note (Signed)
Symptoms currently stable. We'll continue current medications. Continue supplemental oxygen. Follow up 3 months or sooner as needed. Flu shot today.

## 2011-12-27 ENCOUNTER — Ambulatory Visit: Payer: Self-pay | Admitting: Oncology

## 2012-01-30 ENCOUNTER — Telehealth: Payer: Self-pay | Admitting: Internal Medicine

## 2012-01-30 ENCOUNTER — Inpatient Hospital Stay: Payer: Self-pay | Admitting: Internal Medicine

## 2012-01-30 DIAGNOSIS — I498 Other specified cardiac arrhythmias: Secondary | ICD-10-CM | POA: Diagnosis not present

## 2012-01-30 DIAGNOSIS — J9620 Acute and chronic respiratory failure, unspecified whether with hypoxia or hypercapnia: Secondary | ICD-10-CM | POA: Diagnosis not present

## 2012-01-30 DIAGNOSIS — E876 Hypokalemia: Secondary | ICD-10-CM | POA: Diagnosis not present

## 2012-01-30 DIAGNOSIS — R918 Other nonspecific abnormal finding of lung field: Secondary | ICD-10-CM | POA: Diagnosis not present

## 2012-01-30 DIAGNOSIS — R6889 Other general symptoms and signs: Secondary | ICD-10-CM | POA: Diagnosis not present

## 2012-01-30 DIAGNOSIS — J189 Pneumonia, unspecified organism: Secondary | ICD-10-CM | POA: Diagnosis not present

## 2012-01-30 DIAGNOSIS — R141 Gas pain: Secondary | ICD-10-CM | POA: Diagnosis not present

## 2012-01-30 DIAGNOSIS — J441 Chronic obstructive pulmonary disease with (acute) exacerbation: Secondary | ICD-10-CM | POA: Diagnosis not present

## 2012-01-30 LAB — TROPONIN I: Troponin-I: 0.02 ng/mL

## 2012-01-30 LAB — COMPREHENSIVE METABOLIC PANEL
Albumin: 2.8 g/dL — ABNORMAL LOW (ref 3.4–5.0)
Alkaline Phosphatase: 81 U/L (ref 50–136)
Anion Gap: 10 (ref 7–16)
BUN: 23 mg/dL — ABNORMAL HIGH (ref 7–18)
Calcium, Total: 9.4 mg/dL (ref 8.5–10.1)
Creatinine: 0.99 mg/dL (ref 0.60–1.30)
EGFR (African American): >60
EGFR (Non-African Amer.): >60
Glucose: 193 mg/dL — ABNORMAL HIGH (ref 65–99)
Osmolality: 285 (ref 275–301)
SGOT(AST): 17 U/L (ref 15–37)
SGPT (ALT): 17 U/L (ref 12–78)
Total Protein: 8.2 g/dL (ref 6.4–8.2)

## 2012-01-30 LAB — CBC
MCH: 30.7 pg (ref 26.0–34.0)
MCV: 86 fL (ref 80–100)
Platelet: 278 10*3/uL (ref 150–440)
RBC: 4.15 10*6/uL — ABNORMAL LOW (ref 4.40–5.90)
RDW: 17 % — ABNORMAL HIGH (ref 11.5–14.5)
WBC: 19.1 10*3/uL — ABNORMAL HIGH (ref 3.8–10.6)

## 2012-01-30 LAB — CK TOTAL AND CKMB (NOT AT ARMC): CK-MB: <0.5 ng/mL — ABNORMAL LOW (ref 0.5–3.6)

## 2012-01-30 NOTE — Telephone Encounter (Signed)
Caller: [place]/Patient; Patient Name: Peter Johnston; PCP: Ronna Polio (Adults only); Best Callback Phone Number: [phone]. Reports congestion. Onset "a few days ago." Patient is very out of breath at this time on the phone. Wheezing is heard through the phone. Reports he is shivering at this time. Wife got on the call and reports that he feels like he has a fever. Reports productive cough with yellow sputum. Advised patient to take 2 puffs of his Albuterol inhaler at this time during the call. Triaged per Breathing Problems guideline, disposition: Activate EMS 911 for "Worsening breathing problems and known cardiac or respiratory condition ( COPD ) NOT responding to treatment or treatment not available." Advised wife to call 911 at this time. She asked if I could stay on the line while she talked to the 911 dispatcher. Connected [place] dispatch to wife and stayed on the line per request.

## 2012-01-31 DIAGNOSIS — J159 Unspecified bacterial pneumonia: Secondary | ICD-10-CM | POA: Diagnosis not present

## 2012-01-31 DIAGNOSIS — D72829 Elevated white blood cell count, unspecified: Secondary | ICD-10-CM | POA: Diagnosis not present

## 2012-01-31 DIAGNOSIS — J441 Chronic obstructive pulmonary disease with (acute) exacerbation: Secondary | ICD-10-CM | POA: Diagnosis not present

## 2012-01-31 DIAGNOSIS — J9620 Acute and chronic respiratory failure, unspecified whether with hypoxia or hypercapnia: Secondary | ICD-10-CM | POA: Diagnosis not present

## 2012-01-31 LAB — MAGNESIUM: Magnesium: 2 mg/dL

## 2012-01-31 NOTE — Telephone Encounter (Signed)
Spoke with patients spouse and was advised that Mr. Rijos in at [facility] and they are treating him for pneumonia.

## 2012-02-01 DIAGNOSIS — D72829 Elevated white blood cell count, unspecified: Secondary | ICD-10-CM | POA: Diagnosis not present

## 2012-02-01 DIAGNOSIS — J441 Chronic obstructive pulmonary disease with (acute) exacerbation: Secondary | ICD-10-CM | POA: Diagnosis not present

## 2012-02-01 DIAGNOSIS — J159 Unspecified bacterial pneumonia: Secondary | ICD-10-CM | POA: Diagnosis not present

## 2012-02-01 DIAGNOSIS — J9620 Acute and chronic respiratory failure, unspecified whether with hypoxia or hypercapnia: Secondary | ICD-10-CM | POA: Diagnosis not present

## 2012-02-01 LAB — BASIC METABOLIC PANEL
Anion Gap: 5 — ABNORMAL LOW (ref 7–16)
Calcium, Total: 9.2 mg/dL (ref 8.5–10.1)
Chloride: 105 mmol/L (ref 98–107)
Co2: 29 mmol/L (ref 21–32)
Creatinine: 0.62 mg/dL (ref 0.60–1.30)
Osmolality: 281 (ref 275–301)
Potassium: 4.5 mmol/L (ref 3.5–5.1)

## 2012-02-01 LAB — CBC WITH DIFFERENTIAL/PLATELET
Basophil #: 0 10*3/uL (ref 0.0–0.1)
Eosinophil %: 0 %
Lymphocyte #: 0.5 10*3/uL — ABNORMAL LOW (ref 1.0–3.6)
Lymphocyte %: 3.4 %
Monocyte %: 3.4 %
Neutrophil %: 93.1 %
Platelet: 227 10*3/uL (ref 150–440)
RBC: 3.2 10*6/uL — ABNORMAL LOW (ref 4.40–5.90)
WBC: 13.6 10*3/uL — ABNORMAL HIGH (ref 3.8–10.6)

## 2012-02-02 DIAGNOSIS — J189 Pneumonia, unspecified organism: Secondary | ICD-10-CM | POA: Diagnosis not present

## 2012-02-02 DIAGNOSIS — J159 Unspecified bacterial pneumonia: Secondary | ICD-10-CM | POA: Diagnosis not present

## 2012-02-02 DIAGNOSIS — J9620 Acute and chronic respiratory failure, unspecified whether with hypoxia or hypercapnia: Secondary | ICD-10-CM | POA: Diagnosis not present

## 2012-02-02 DIAGNOSIS — J439 Emphysema, unspecified: Secondary | ICD-10-CM | POA: Diagnosis not present

## 2012-02-02 DIAGNOSIS — J441 Chronic obstructive pulmonary disease with (acute) exacerbation: Secondary | ICD-10-CM | POA: Diagnosis not present

## 2012-02-02 DIAGNOSIS — D72829 Elevated white blood cell count, unspecified: Secondary | ICD-10-CM | POA: Diagnosis not present

## 2012-02-02 LAB — CULTURE, BLOOD (SINGLE)

## 2012-02-02 LAB — VANCOMYCIN, TROUGH: Vancomycin, Trough: 14 ug/mL (ref 10–20)

## 2012-02-03 DIAGNOSIS — J9620 Acute and chronic respiratory failure, unspecified whether with hypoxia or hypercapnia: Secondary | ICD-10-CM | POA: Diagnosis not present

## 2012-02-03 DIAGNOSIS — J441 Chronic obstructive pulmonary disease with (acute) exacerbation: Secondary | ICD-10-CM | POA: Diagnosis not present

## 2012-02-03 DIAGNOSIS — J159 Unspecified bacterial pneumonia: Secondary | ICD-10-CM | POA: Diagnosis not present

## 2012-02-03 DIAGNOSIS — D72829 Elevated white blood cell count, unspecified: Secondary | ICD-10-CM | POA: Diagnosis not present

## 2012-02-03 LAB — CBC WITH DIFFERENTIAL/PLATELET
HCT: 30.8 % — ABNORMAL LOW (ref 40.0–52.0)
MCHC: 32.7 g/dL (ref 32.0–36.0)
MCV: 86 fL (ref 80–100)
Monocytes: 5 %
Platelet: 279 10*3/uL (ref 150–440)
RDW: 16.9 % — ABNORMAL HIGH (ref 11.5–14.5)
Segmented Neutrophils: 85 %

## 2012-02-04 DIAGNOSIS — D72829 Elevated white blood cell count, unspecified: Secondary | ICD-10-CM | POA: Diagnosis not present

## 2012-02-04 DIAGNOSIS — J441 Chronic obstructive pulmonary disease with (acute) exacerbation: Secondary | ICD-10-CM | POA: Diagnosis not present

## 2012-02-04 DIAGNOSIS — J9620 Acute and chronic respiratory failure, unspecified whether with hypoxia or hypercapnia: Secondary | ICD-10-CM | POA: Diagnosis not present

## 2012-02-04 DIAGNOSIS — J159 Unspecified bacterial pneumonia: Secondary | ICD-10-CM | POA: Diagnosis not present

## 2012-02-04 LAB — EXPECTORATED SPUTUM ASSESSMENT W GRAM STAIN, RFLX TO RESP C

## 2012-02-05 DIAGNOSIS — D72829 Elevated white blood cell count, unspecified: Secondary | ICD-10-CM | POA: Diagnosis not present

## 2012-02-05 DIAGNOSIS — J159 Unspecified bacterial pneumonia: Secondary | ICD-10-CM | POA: Diagnosis not present

## 2012-02-05 DIAGNOSIS — J9620 Acute and chronic respiratory failure, unspecified whether with hypoxia or hypercapnia: Secondary | ICD-10-CM | POA: Diagnosis not present

## 2012-02-05 DIAGNOSIS — J441 Chronic obstructive pulmonary disease with (acute) exacerbation: Secondary | ICD-10-CM | POA: Diagnosis not present

## 2012-02-05 LAB — CULTURE, BLOOD (SINGLE)

## 2012-02-05 LAB — CBC WITH DIFFERENTIAL/PLATELET
Basophil #: 0 10*3/uL (ref 0.0–0.1)
Eosinophil #: 0 10*3/uL (ref 0.0–0.7)
HGB: 11.7 g/dL — ABNORMAL LOW (ref 13.0–18.0)
Lymphocyte #: 0.9 10*3/uL — ABNORMAL LOW (ref 1.0–3.6)
Lymphocyte %: 6.6 %
MCHC: 32.4 g/dL (ref 32.0–36.0)
Monocyte #: 0.7 x10 3/mm (ref 0.2–1.0)
Neutrophil %: 88.3 %
RDW: 17 % — ABNORMAL HIGH (ref 11.5–14.5)

## 2012-02-06 LAB — CULTURE, BLOOD (SINGLE)

## 2012-02-14 DIAGNOSIS — J449 Chronic obstructive pulmonary disease, unspecified: Secondary | ICD-10-CM | POA: Diagnosis not present

## 2012-02-14 DIAGNOSIS — M069 Rheumatoid arthritis, unspecified: Secondary | ICD-10-CM | POA: Diagnosis not present

## 2012-03-12 DIAGNOSIS — F17200 Nicotine dependence, unspecified, uncomplicated: Secondary | ICD-10-CM | POA: Diagnosis not present

## 2012-03-12 DIAGNOSIS — J449 Chronic obstructive pulmonary disease, unspecified: Secondary | ICD-10-CM | POA: Diagnosis not present

## 2012-03-12 DIAGNOSIS — R05 Cough: Secondary | ICD-10-CM | POA: Diagnosis not present

## 2012-03-12 DIAGNOSIS — J9610 Chronic respiratory failure, unspecified whether with hypoxia or hypercapnia: Secondary | ICD-10-CM | POA: Diagnosis not present

## 2012-03-12 DIAGNOSIS — R0602 Shortness of breath: Secondary | ICD-10-CM | POA: Diagnosis not present

## 2012-03-12 DIAGNOSIS — J168 Pneumonia due to other specified infectious organisms: Secondary | ICD-10-CM | POA: Diagnosis not present

## 2012-03-23 ENCOUNTER — Ambulatory Visit: Payer: Self-pay | Admitting: Internal Medicine

## 2012-03-23 DIAGNOSIS — R062 Wheezing: Secondary | ICD-10-CM | POA: Diagnosis not present

## 2012-03-23 DIAGNOSIS — R918 Other nonspecific abnormal finding of lung field: Secondary | ICD-10-CM | POA: Diagnosis not present

## 2012-03-26 ENCOUNTER — Ambulatory Visit: Payer: Medicare Other | Admitting: Internal Medicine

## 2012-03-26 ENCOUNTER — Other Ambulatory Visit: Payer: Self-pay | Admitting: Internal Medicine

## 2012-03-26 DIAGNOSIS — J449 Chronic obstructive pulmonary disease, unspecified: Secondary | ICD-10-CM | POA: Diagnosis not present

## 2012-03-26 DIAGNOSIS — J438 Other emphysema: Secondary | ICD-10-CM | POA: Diagnosis not present

## 2012-03-26 DIAGNOSIS — J168 Pneumonia due to other specified infectious organisms: Secondary | ICD-10-CM | POA: Diagnosis not present

## 2012-03-26 NOTE — Telephone Encounter (Signed)
Med filled.  

## 2012-04-05 ENCOUNTER — Ambulatory Visit: Payer: Self-pay | Admitting: Internal Medicine

## 2012-04-05 DIAGNOSIS — R918 Other nonspecific abnormal finding of lung field: Secondary | ICD-10-CM | POA: Diagnosis not present

## 2012-04-05 DIAGNOSIS — J189 Pneumonia, unspecified organism: Secondary | ICD-10-CM | POA: Diagnosis not present

## 2012-04-05 DIAGNOSIS — R059 Cough, unspecified: Secondary | ICD-10-CM | POA: Diagnosis not present

## 2012-04-06 ENCOUNTER — Other Ambulatory Visit: Payer: Self-pay | Admitting: Internal Medicine

## 2012-04-06 NOTE — Telephone Encounter (Signed)
Sent in to pharmacy.  

## 2012-04-09 DIAGNOSIS — J449 Chronic obstructive pulmonary disease, unspecified: Secondary | ICD-10-CM | POA: Diagnosis not present

## 2012-04-09 DIAGNOSIS — J9610 Chronic respiratory failure, unspecified whether with hypoxia or hypercapnia: Secondary | ICD-10-CM | POA: Diagnosis not present

## 2012-04-11 DIAGNOSIS — H4010X0 Unspecified open-angle glaucoma, stage unspecified: Secondary | ICD-10-CM | POA: Diagnosis not present

## 2012-04-13 ENCOUNTER — Encounter: Payer: Self-pay | Admitting: Internal Medicine

## 2012-04-13 ENCOUNTER — Ambulatory Visit (INDEPENDENT_AMBULATORY_CARE_PROVIDER_SITE_OTHER): Payer: Medicare Other | Admitting: Internal Medicine

## 2012-04-13 VITALS — BP 130/72 | HR 84 | Temp 98.1°F | Resp 16 | Wt 168.0 lb

## 2012-04-13 DIAGNOSIS — J449 Chronic obstructive pulmonary disease, unspecified: Secondary | ICD-10-CM

## 2012-04-13 DIAGNOSIS — I10 Essential (primary) hypertension: Secondary | ICD-10-CM

## 2012-04-13 DIAGNOSIS — M069 Rheumatoid arthritis, unspecified: Secondary | ICD-10-CM | POA: Diagnosis not present

## 2012-04-13 DIAGNOSIS — D509 Iron deficiency anemia, unspecified: Secondary | ICD-10-CM

## 2012-04-13 LAB — CBC WITH DIFFERENTIAL/PLATELET
Basophils Relative: 0.7 % (ref 0.0–3.0)
Eosinophils Relative: 2.4 % (ref 0.0–5.0)
Hemoglobin: 12.3 g/dL — ABNORMAL LOW (ref 13.0–17.0)
Lymphocytes Relative: 23.2 % (ref 12.0–46.0)
MCV: 88.2 fl (ref 78.0–100.0)
Neutrophils Relative %: 64.5 % (ref 43.0–77.0)
RBC: 4.21 Mil/uL — ABNORMAL LOW (ref 4.22–5.81)
WBC: 9.6 10*3/uL (ref 4.5–10.5)

## 2012-04-13 LAB — COMPREHENSIVE METABOLIC PANEL
Albumin: 3.8 g/dL (ref 3.5–5.2)
Alkaline Phosphatase: 46 U/L (ref 39–117)
BUN: 16 mg/dL (ref 6–23)
Creatinine, Ser: 0.9 mg/dL (ref 0.4–1.5)
Glucose, Bld: 115 mg/dL — ABNORMAL HIGH (ref 70–99)
Potassium: 4.2 mEq/L (ref 3.5–5.1)

## 2012-04-13 NOTE — Assessment & Plan Note (Signed)
Symptomatically improved on Embrel and low dose prednisone. Will continue follow up with rheumatology. Follow up here in 3 months.

## 2012-04-13 NOTE — Assessment & Plan Note (Signed)
BP well controlled on current medication. Will continue. Follow up 3 months and prn.

## 2012-04-13 NOTE — Assessment & Plan Note (Signed)
Severe COPD, on 3L by [place]. Symptomatically stable. Will request recent records from pulmonary medicine. Continue inhaled bronchodilators and steroids. Encouraged smoking cessation. Continue low dose prednisone. Follow up 3 months and prn.

## 2012-04-13 NOTE — Progress Notes (Signed)
Subjective:    Patient ID: Peter Johnston, male    DOB: 1940/07/12, 72 y.o.   MRN: [id number]  HPI 71YO male with severe COPD, rheumatoid arthritis presents for follow up. Doing well. Continues to use 3L oxygen by [place] 24/7. Has chronic dyspnea and cough, which are currently at baseline. Recently completed prednisone taper and is on chronic prednisone 5mg  daily. Reports compliance with inhalers. Has switched to e-cigarette.   In regards to RA, symptoms better controlled on Embrel and low dose prednisone. Reports swelling in joints, particularly ankles improved. No recent infections.   Outpatient Encounter Prescriptions as of 04/13/2012  Medication Sig Dispense Refill  . albuterol (PROVENTIL) (2.5 MG/3ML) 0.083% nebulizer solution Take 3 mLs (2.5 mg total) by nebulization every 6 (six) hours as needed for wheezing.  150 mL  1  . allopurinol (ZYLOPRIM) 300 MG tablet Take 300 mg by mouth daily.      Marland Kitchen atenolol (TENORMIN) 12.5 mg TABS Take 0.5 tablets (12.5 mg total) by mouth daily.  90 tablet  5  . fenofibrate (TRICOR) 145 MG tablet TAKE 1 TABLET DAILY  90 tablet  0  . Fluticasone-Salmeterol (ADVAIR) 250-50 MCG/DOSE AEPB Inhale 1 puff into the lungs every 12 (twelve) hours.      . furosemide (LASIX) 20 MG tablet       . hydroxychloroquine (PLAQUENIL) 200 MG tablet Take 200 mg by mouth daily.      Marland Kitchen ipratropium (ATROVENT) 0.02 % nebulizer solution Take 500 mcg by nebulization 2 (two) times daily as needed.      Marland Kitchen NEXIUM 40 MG capsule       . omeprazole (PRILOSEC) 40 MG capsule Take 1 capsule (40 mg total) by mouth daily.  90 capsule  4  . predniSONE (DELTASONE) 5 MG tablet Take 5 mg by mouth daily.       . theophylline (THEO-24) 100 MG 24 hr capsule Take 100 mg by mouth daily.      Marland Kitchen tiotropium (SPIRIVA) 18 MCG inhalation capsule Place 18 mcg into inhaler and inhale daily.      . traMADol (ULTRAM) 50 MG tablet Take 1 tablet (50 mg total) by mouth every 6 (six) hours as needed.  180 tablet  3   BP  130/72  Pulse 84  Temp 98.1 F (36.7 C) (Oral)  Resp 16  Wt 168 lb (76.204 kg)  SpO2 93%  Review of Systems  Constitutional: Positive for fatigue. Negative for fever, chills, activity change, appetite change and unexpected weight change.  Eyes: Negative for visual disturbance.  Respiratory: Positive for cough, shortness of breath and wheezing.   Cardiovascular: Negative for chest pain, palpitations and leg swelling.  Gastrointestinal: Negative for abdominal pain and abdominal distention.  Genitourinary: Negative for dysuria, urgency and difficulty urinating.  Musculoskeletal: Positive for myalgias and arthralgias. Negative for gait problem.  Skin: Negative for color change and rash.  Hematological: Negative for adenopathy.  Psychiatric/Behavioral: Negative for sleep disturbance and dysphoric mood. The patient is not nervous/anxious.        Objective:   Physical Exam  Constitutional: He is oriented to person, place, and time. He appears well-developed and well-nourished. No distress.  HENT:  Head: Normocephalic and atraumatic.  Right Ear: External ear normal.  Left Ear: External ear normal.  Nose: Nose normal.  Mouth/Throat: Oropharynx is clear and moist. No oropharyngeal exudate.  Eyes: Conjunctivae normal and EOM are normal. Pupils are equal, round, and reactive to light. Right eye exhibits no discharge. Left eye exhibits no  discharge. No scleral icterus.  Neck: Normal range of motion. Neck supple. No tracheal deviation present. No thyromegaly present.  Cardiovascular: Normal rate, regular rhythm and normal heart sounds.  Exam reveals no gallop and no friction rub.   No murmur heard. Pulmonary/Chest: Accessory muscle usage present. Tachypnea noted. No respiratory distress. He has decreased breath sounds. He has wheezes. He has rhonchi (few scattered which clear with cough). He has no rales. He exhibits no tenderness.  Musculoskeletal: Normal range of motion. He exhibits no edema.   Lymphadenopathy:    He has no cervical adenopathy.  Neurological: He is alert and oriented to person, place, and time. No cranial nerve deficit. Coordination normal.  Skin: Skin is warm and dry. No rash noted. He is not diaphoretic. No erythema. No pallor.  Psychiatric: He has a normal mood and affect. His behavior is normal. Judgment and thought content normal.          Assessment & Plan:

## 2012-04-13 NOTE — Assessment & Plan Note (Signed)
Will recheck CBC with labs today. 

## 2012-04-17 DIAGNOSIS — J449 Chronic obstructive pulmonary disease, unspecified: Secondary | ICD-10-CM | POA: Diagnosis not present

## 2012-04-17 DIAGNOSIS — M069 Rheumatoid arthritis, unspecified: Secondary | ICD-10-CM | POA: Diagnosis not present

## 2012-05-02 ENCOUNTER — Telehealth: Payer: Self-pay | Admitting: Internal Medicine

## 2012-05-02 NOTE — Telephone Encounter (Signed)
Patient left message on voicemail stating that he needs refill on his omeprazole but it needs [place]

## 2012-05-02 NOTE — Telephone Encounter (Signed)
Patient needing a prior authorization on his  Omeprazole Case I.D [id number] medication #[phone].  Patient wanting samples of Prilosec ,and Spiriva .

## 2012-05-03 ENCOUNTER — Telehealth: Payer: Self-pay | Admitting: Internal Medicine

## 2012-05-03 NOTE — Telephone Encounter (Signed)
Called [phone] for prior authorization for the Omeprazole 40 mg, they are faxing the form now; Case number is [id number]

## 2012-05-03 NOTE — Telephone Encounter (Signed)
Pt dropped off forms to be signed. I put them in your inbox

## 2012-05-24 ENCOUNTER — Telehealth: Payer: Self-pay | Admitting: *Deleted

## 2012-05-24 ENCOUNTER — Other Ambulatory Visit: Payer: Self-pay | Admitting: *Deleted

## 2012-05-24 NOTE — Telephone Encounter (Signed)
Prior authorization request form was filled out and was faxed to [phone]

## 2012-05-24 NOTE — Telephone Encounter (Signed)
Called [phone] for prior authorization on the Omeprazole, form is being faxed over now   Case # [id number]

## 2012-05-24 NOTE — Telephone Encounter (Signed)
Patient called wanting refill on meds. He was informed to contact [facility] and have them fax [place] what meds need to refilled

## 2012-05-29 ENCOUNTER — Telehealth: Payer: Self-pay | Admitting: *Deleted

## 2012-05-29 NOTE — Telephone Encounter (Signed)
Received a fax from [facility], Approval notice for the Omeprazole Approved from 02.06.2014--02.27.2015

## 2012-06-19 ENCOUNTER — Other Ambulatory Visit: Payer: Self-pay | Admitting: Internal Medicine

## 2012-07-09 DIAGNOSIS — J449 Chronic obstructive pulmonary disease, unspecified: Secondary | ICD-10-CM | POA: Diagnosis not present

## 2012-07-09 DIAGNOSIS — F17200 Nicotine dependence, unspecified, uncomplicated: Secondary | ICD-10-CM | POA: Diagnosis not present

## 2012-07-09 DIAGNOSIS — J438 Other emphysema: Secondary | ICD-10-CM | POA: Diagnosis not present

## 2012-07-09 DIAGNOSIS — J019 Acute sinusitis, unspecified: Secondary | ICD-10-CM | POA: Diagnosis not present

## 2012-07-09 DIAGNOSIS — R0602 Shortness of breath: Secondary | ICD-10-CM | POA: Diagnosis not present

## 2012-07-18 ENCOUNTER — Ambulatory Visit (INDEPENDENT_AMBULATORY_CARE_PROVIDER_SITE_OTHER): Payer: Medicare Other | Admitting: Internal Medicine

## 2012-07-18 ENCOUNTER — Encounter: Payer: Self-pay | Admitting: Internal Medicine

## 2012-07-18 VITALS — BP 100/58 | HR 94 | Wt 162.0 lb

## 2012-07-18 DIAGNOSIS — J441 Chronic obstructive pulmonary disease with (acute) exacerbation: Secondary | ICD-10-CM | POA: Insufficient documentation

## 2012-07-18 DIAGNOSIS — D509 Iron deficiency anemia, unspecified: Secondary | ICD-10-CM

## 2012-07-18 DIAGNOSIS — I10 Essential (primary) hypertension: Secondary | ICD-10-CM | POA: Diagnosis not present

## 2012-07-18 DIAGNOSIS — L570 Actinic keratosis: Secondary | ICD-10-CM

## 2012-07-18 DIAGNOSIS — M069 Rheumatoid arthritis, unspecified: Secondary | ICD-10-CM

## 2012-07-18 DIAGNOSIS — K219 Gastro-esophageal reflux disease without esophagitis: Secondary | ICD-10-CM

## 2012-07-18 DIAGNOSIS — J449 Chronic obstructive pulmonary disease, unspecified: Secondary | ICD-10-CM | POA: Diagnosis not present

## 2012-07-18 LAB — CBC WITH DIFFERENTIAL/PLATELET
Basophils Relative: 0.9 % (ref 0.0–3.0)
Eosinophils Relative: 3.1 % (ref 0.0–5.0)
HCT: 40.1 % (ref 39.0–52.0)
Hemoglobin: 13.4 g/dL (ref 13.0–17.0)
Lymphs Abs: 2.2 10*3/uL (ref 0.7–4.0)
MCV: 88 fl (ref 78.0–100.0)
Monocytes Absolute: 1 10*3/uL (ref 0.1–1.0)
Monocytes Relative: 9 % (ref 3.0–12.0)
RBC: 4.56 Mil/uL (ref 4.22–5.81)
WBC: 11.3 10*3/uL — ABNORMAL HIGH (ref 4.5–10.5)

## 2012-07-18 LAB — COMPREHENSIVE METABOLIC PANEL
Albumin: 3.9 g/dL (ref 3.5–5.2)
Alkaline Phosphatase: 48 U/L (ref 39–117)
BUN: 17 mg/dL (ref 6–23)
CO2: 35 mEq/L — ABNORMAL HIGH (ref 19–32)
GFR: 68.95 mL/min (ref >60.00)
Glucose, Bld: 133 mg/dL — ABNORMAL HIGH (ref 70–99)
Total Bilirubin: 0.6 mg/dL (ref 0.3–1.2)

## 2012-07-18 MED ORDER — PREDNISONE (PAK) 10 MG PO TABS: ORAL_TABLET | ORAL | Status: DC

## 2012-07-18 NOTE — Assessment & Plan Note (Signed)
Symptoms poorly controlled with omeprazole. Unable to afford Nexium, which controlled symptoms well. Will give samples of Nexium as they are available.

## 2012-07-18 NOTE — Assessment & Plan Note (Signed)
Symptoms recently worsened with increased productive cough and dypsnea. On levaquin as prescribed by pulmonologist. Will add prednisone taper. Will continue Advair, Spiriva, prn albuterol. Encouraged smoking cessation.

## 2012-07-18 NOTE — Progress Notes (Signed)
Subjective:    Patient ID: Peter Johnston, male    DOB: 1940-12-30, 72 y.o.   MRN: [id number]  HPI 71YO male with COPD, rheumatoid arthritis, hypertension, anemia presents for follow up. He was recently seen by pulmonologist, started on levaquin because of increased productive cough. Notes some increased dyspnea, wheezing over last few days. No fever, chills.  No chest pain. Taking inhaled bronchodilators as directed, however has been out of Spiriva for a few days.  Symptoms of GERD were well controlled with nexium, but poorly controlled with omeprazole. Pt has been unable to afford nexium.  Symptoms of RA recently well controlled with use of Embrel. No side effects noted. Swelling in ankles has improved.  Continues to smoke, now smoking both regular cigarettes and e-cigs.  Outpatient Encounter Prescriptions as of 07/18/2012  Medication Sig Dispense Refill  . albuterol (PROVENTIL) (2.5 MG/3ML) 0.083% nebulizer solution Take 3 mLs (2.5 mg total) by nebulization every 6 (six) hours as needed for wheezing.  150 mL  1  . allopurinol (ZYLOPRIM) 300 MG tablet Take 300 mg by mouth daily.      Marland Kitchen atenolol (TENORMIN) 12.5 mg TABS Take 0.5 tablets (12.5 mg total) by mouth daily.  90 tablet  5  . ENBREL 50 MG/ML injection       . fenofibrate (TRICOR) 145 MG tablet TAKE 1 TABLET DAILY  90 tablet  0  . Fluticasone-Salmeterol (ADVAIR) 250-50 MCG/DOSE AEPB Inhale 1 puff into the lungs every 12 (twelve) hours.      . furosemide (LASIX) 20 MG tablet Take 20 mg by mouth daily as needed.       . hydroxychloroquine (PLAQUENIL) 200 MG tablet Take 200 mg by mouth daily.      Marland Kitchen ipratropium (ATROVENT) 0.02 % nebulizer solution Take 500 mcg by nebulization 2 (two) times daily as needed.      Marland Kitchen ipratropium-albuterol (DUONEB) 0.5-2.5 (3) MG/3ML SOLN       . levofloxacin (LEVAQUIN) 500 MG tablet       . NEXIUM 40 MG capsule       . predniSONE (DELTASONE) 5 MG tablet Take 5 mg by mouth daily.       . theophylline  (THEO-24) 100 MG 24 hr capsule Take 100 mg by mouth 2 (two) times daily.       Marland Kitchen tiotropium (SPIRIVA) 18 MCG inhalation capsule Place 18 mcg into inhaler and inhale daily.      . traMADol (ULTRAM) 50 MG tablet TAKE ONE TABLET BY MOUTH EVERY DAY  30 tablet  0  . omeprazole (PRILOSEC) 40 MG capsule Take 1 capsule (40 mg total) by mouth daily.  90 capsule  4  . predniSONE (STERAPRED UNI-PAK) 10 MG tablet Take 60mg  day 1 then taper by 10mg  daily  21 tablet  0   No facility-administered encounter medications on file as of 07/18/2012.   BP 100/58  Pulse 94  Wt 162 lb (73.483 kg)  BMI 25.76 kg/m2  SpO2 93%  Review of Systems  Constitutional: Positive for fatigue. Negative for fever, chills, activity change, appetite change and unexpected weight change.  HENT: Negative for congestion and rhinorrhea.   Eyes: Negative for visual disturbance.  Respiratory: Positive for cough and shortness of breath.   Cardiovascular: Negative for chest pain, palpitations and leg swelling.  Gastrointestinal: Negative for abdominal pain and abdominal distention.  Genitourinary: Negative for dysuria, urgency and difficulty urinating.  Musculoskeletal: Negative for arthralgias and gait problem.  Skin: Negative for color change and rash.  Hematological: Negative for adenopathy.  Psychiatric/Behavioral: Negative for sleep disturbance and dysphoric mood. The patient is not nervous/anxious.        Objective:   Physical Exam  Constitutional: He is oriented to person, place, and time. He appears well-developed and well-nourished. No distress.  HENT:  Head: Normocephalic and atraumatic.  Right Ear: External ear normal.  Left Ear: External ear normal.  Nose: Nose normal.  Mouth/Throat: Oropharynx is clear and moist. No oropharyngeal exudate.  Eyes: Conjunctivae and EOM are normal. Pupils are equal, round, and reactive to light. Right eye exhibits no discharge. Left eye exhibits no discharge. No scleral icterus.  Neck:  Normal range of motion. Neck supple. No tracheal deviation present. No thyromegaly present.  Cardiovascular: Normal rate, regular rhythm and normal heart sounds.  Exam reveals no gallop and no friction rub.   No murmur heard. Pulmonary/Chest: Accessory muscle usage present. Not tachypneic. No respiratory distress. He has decreased breath sounds. He has wheezes. He has no rhonchi. He has no rales. He exhibits no tenderness.  Musculoskeletal: Normal range of motion. He exhibits no edema.  Lymphadenopathy:    He has no cervical adenopathy.  Neurological: He is alert and oriented to person, place, and time. No cranial nerve deficit. Coordination normal.  Skin: Skin is warm and dry. No rash noted. He is not diaphoretic. No erythema. No pallor.  Psychiatric: He has a normal mood and affect. His behavior is normal. Judgment and thought content normal.          Assessment & Plan:

## 2012-07-18 NOTE — Assessment & Plan Note (Signed)
BP Readings from Last 3 Encounters:  07/18/12 100/58  04/13/12 130/72  12/26/11 110/62   BP well controlled with current medication. Will continue.

## 2012-07-18 NOTE — Assessment & Plan Note (Signed)
Symptoms well controlled with use of Embrel. Will continue to follow up with Dr. Gavin Potters in rheumatology.

## 2012-07-18 NOTE — Assessment & Plan Note (Signed)
Blood counts improved after previous iron infusion. Will continue to monitor.

## 2012-07-18 NOTE — Assessment & Plan Note (Signed)
Symptoms consistent with COPD exacerbation. Pt is on Levaquin as prescribed by his pulmonologist. Will start prednisone taper back to help with inflammation. Follow up 4 weeks and prn.

## 2012-07-30 ENCOUNTER — Other Ambulatory Visit: Payer: Self-pay | Admitting: Internal Medicine

## 2012-07-30 NOTE — Telephone Encounter (Signed)
Refill

## 2012-08-09 DIAGNOSIS — M109 Gout, unspecified: Secondary | ICD-10-CM | POA: Diagnosis not present

## 2012-08-09 DIAGNOSIS — M069 Rheumatoid arthritis, unspecified: Secondary | ICD-10-CM | POA: Diagnosis not present

## 2012-08-09 DIAGNOSIS — J449 Chronic obstructive pulmonary disease, unspecified: Secondary | ICD-10-CM | POA: Diagnosis not present

## 2012-08-16 DIAGNOSIS — L821 Other seborrheic keratosis: Secondary | ICD-10-CM | POA: Diagnosis not present

## 2012-08-16 DIAGNOSIS — Z0189 Encounter for other specified special examinations: Secondary | ICD-10-CM | POA: Diagnosis not present

## 2012-08-16 DIAGNOSIS — L570 Actinic keratosis: Secondary | ICD-10-CM | POA: Diagnosis not present

## 2012-08-21 ENCOUNTER — Ambulatory Visit: Payer: Medicare Other | Admitting: Internal Medicine

## 2012-08-29 ENCOUNTER — Other Ambulatory Visit: Payer: Self-pay

## 2012-08-29 MED ORDER — FENOFIBRATE 145 MG PO TABS: ORAL_TABLET | ORAL | Status: DC

## 2012-08-30 DIAGNOSIS — J449 Chronic obstructive pulmonary disease, unspecified: Secondary | ICD-10-CM | POA: Diagnosis not present

## 2012-08-30 DIAGNOSIS — J9610 Chronic respiratory failure, unspecified whether with hypoxia or hypercapnia: Secondary | ICD-10-CM | POA: Diagnosis not present

## 2012-08-30 DIAGNOSIS — J438 Other emphysema: Secondary | ICD-10-CM | POA: Diagnosis not present

## 2012-09-10 ENCOUNTER — Other Ambulatory Visit: Payer: Self-pay | Admitting: Internal Medicine

## 2012-09-21 ENCOUNTER — Other Ambulatory Visit: Payer: Self-pay | Admitting: Internal Medicine

## 2012-10-05 ENCOUNTER — Telehealth: Payer: Self-pay | Admitting: Internal Medicine

## 2012-10-05 MED ORDER — FENOFIBRATE 145 MG PO TABS: ORAL_TABLET | ORAL | Status: DC

## 2012-10-05 NOTE — Telephone Encounter (Signed)
Medication ID number for [facility] is [id number] and phone number [phone]

## 2012-10-05 NOTE — Telephone Encounter (Signed)
Pt is needing refill on his Cholesterol medication. He says [facility] says we never faxed over the refill ??? And he is compeltely out of meds. He was wondering if he could get some sent to a pharmacy he will use [facility] on [address]

## 2012-10-05 NOTE — Telephone Encounter (Signed)
Medication was sent to [facility] in June and patient would like it sent to [facility], Rx sent to [facility]. He already has a prescription at [facility], informed him to pick up about 2 weeks from them until his mail order comes in.

## 2012-10-08 DIAGNOSIS — H4011X0 Primary open-angle glaucoma, stage unspecified: Secondary | ICD-10-CM | POA: Diagnosis not present

## 2012-10-10 ENCOUNTER — Other Ambulatory Visit: Payer: Self-pay | Admitting: Internal Medicine

## 2012-10-10 NOTE — Telephone Encounter (Signed)
Okay to refill?    (Dr. Tilman Neat pt)

## 2012-10-19 ENCOUNTER — Other Ambulatory Visit: Payer: Self-pay | Admitting: Internal Medicine

## 2012-10-24 ENCOUNTER — Other Ambulatory Visit: Payer: Self-pay | Admitting: *Deleted

## 2012-10-24 DIAGNOSIS — K219 Gastro-esophageal reflux disease without esophagitis: Secondary | ICD-10-CM

## 2012-10-24 MED ORDER — OMEPRAZOLE 40 MG PO CPDR: 40.0000 mg | DELAYED_RELEASE_CAPSULE | Freq: Every day | ORAL | Status: DC

## 2012-10-24 NOTE — Telephone Encounter (Signed)
Eprescribed.

## 2012-11-13 ENCOUNTER — Encounter: Payer: Self-pay | Admitting: *Deleted

## 2012-11-14 ENCOUNTER — Encounter: Payer: Self-pay | Admitting: Internal Medicine

## 2012-11-14 ENCOUNTER — Ambulatory Visit (INDEPENDENT_AMBULATORY_CARE_PROVIDER_SITE_OTHER): Payer: Medicare Other | Admitting: Internal Medicine

## 2012-11-14 VITALS — BP 116/66 | HR 71 | Temp 98.2°F | Wt 157.0 lb

## 2012-11-14 DIAGNOSIS — Z125 Encounter for screening for malignant neoplasm of prostate: Secondary | ICD-10-CM | POA: Diagnosis not present

## 2012-11-14 DIAGNOSIS — M069 Rheumatoid arthritis, unspecified: Secondary | ICD-10-CM

## 2012-11-14 DIAGNOSIS — D509 Iron deficiency anemia, unspecified: Secondary | ICD-10-CM

## 2012-11-14 DIAGNOSIS — E785 Hyperlipidemia, unspecified: Secondary | ICD-10-CM

## 2012-11-14 DIAGNOSIS — M545 Low back pain, unspecified: Secondary | ICD-10-CM | POA: Insufficient documentation

## 2012-11-14 DIAGNOSIS — J449 Chronic obstructive pulmonary disease, unspecified: Secondary | ICD-10-CM | POA: Diagnosis not present

## 2012-11-14 DIAGNOSIS — B370 Candidal stomatitis: Secondary | ICD-10-CM

## 2012-11-14 LAB — COMPREHENSIVE METABOLIC PANEL
ALT: 17 U/L (ref 0–53)
AST: 22 U/L (ref 0–37)
CO2: 28 mEq/L (ref 19–32)
Calcium: 9.5 mg/dL (ref 8.4–10.5)
Chloride: 103 mEq/L (ref 96–112)
GFR: 78.61 mL/min (ref >60.00)
Sodium: 139 mEq/L (ref 135–145)
Total Protein: 7.2 g/dL (ref 6.0–8.3)

## 2012-11-14 LAB — CBC WITH DIFFERENTIAL/PLATELET
Basophils Absolute: 0.1 10*3/uL (ref 0.0–0.1)
Eosinophils Relative: 1.2 % (ref 0.0–5.0)
HCT: 38.6 % — ABNORMAL LOW (ref 39.0–52.0)
Hemoglobin: 12.5 g/dL — ABNORMAL LOW (ref 13.0–17.0)
Lymphocytes Relative: 14.6 % (ref 12.0–46.0)
Lymphs Abs: 1.8 10*3/uL (ref 0.7–4.0)
Monocytes Relative: 8 % (ref 3.0–12.0)
Platelets: 270 10*3/uL (ref 150.0–400.0)
RDW: 16.3 % — ABNORMAL HIGH (ref 11.5–14.6)
WBC: 12.6 10*3/uL — ABNORMAL HIGH (ref 4.5–10.5)

## 2012-11-14 LAB — PSA, MEDICARE: PSA: 0.75 ng/ml (ref 0.10–4.00)

## 2012-11-14 LAB — LIPID PANEL
Total CHOL/HDL Ratio: 3
VLDL: 20.6 mg/dL (ref 0.0–40.0)

## 2012-11-14 MED ORDER — LIDOCAINE 5 % EX PTCH: 1.0000 | MEDICATED_PATCH | CUTANEOUS | Status: DC

## 2012-11-14 MED ORDER — FLUTICASONE FUROATE-VILANTEROL 100-25 MCG/INH IN AEPB: 1.0000 | INHALATION_SPRAY | Freq: Every day | RESPIRATORY_TRACT | Status: DC

## 2012-11-14 MED ORDER — FLUCONAZOLE 100 MG PO TABS: 100.0000 mg | ORAL_TABLET | Freq: Every day | ORAL | Status: DC

## 2012-11-14 NOTE — Assessment & Plan Note (Signed)
Severe COPD, on 3Loxygen 24/7. Followed by Dr. Welton Flakes. On Breo, prn albuterol, chronic oral prednisone. Continues to smoke. Strongly encouraged smoking cessation. Will continue current medications.

## 2012-11-14 NOTE — Assessment & Plan Note (Signed)
Will check CBC with labs today. 

## 2012-11-14 NOTE — Assessment & Plan Note (Signed)
Symptoms are consistent with degenerative arthritis. Will try adding topical Lidoderm patch to help with pain management. If no improvement, would favor repeating imaging with plain x-ray of the lumbar spine.

## 2012-11-14 NOTE — Progress Notes (Signed)
Subjective:    Patient ID: Peter Johnston, male    DOB: Jul 31, 1940, 72 y.o.   MRN: [id number]  HPI 72 year old male with history of COPD, rheumatoid arthritis, anemia, tobacco abuse presents for followup. He reports he is generally been doing well. He is back smoking again. He reports chronic shortness of breath, productive cough which is relatively unchanged. He denies any recent fever, chills, chest pain. He reports that his pulmonologist changed him from Advair to [facility]. He reports some improvement with this.  He is concerned today with chronic low back pain which is gradually worsening. He reports he had imaging in the past which showed arthritis but no acute changes. Pain is described as aching occasionally radiates down the back of his leg. It is made worse with movement. He has been taking Tramadol once daily with minimal improvement. He denies any recent weakness in his legs or falls.  Outpatient Encounter Prescriptions as of 11/14/2012  Medication Sig Dispense Refill  . albuterol (PROVENTIL) (2.5 MG/3ML) 0.083% nebulizer solution Take 3 mLs (2.5 mg total) by nebulization every 6 (six) hours as needed for wheezing.  150 mL  1  . allopurinol (ZYLOPRIM) 300 MG tablet Take 300 mg by mouth daily.      Marland Kitchen atenolol (TENORMIN) 12.5 mg TABS Take 0.5 tablets (12.5 mg total) by mouth daily.  90 tablet  5  . ENBREL 50 MG/ML injection       . fenofibrate (TRICOR) 145 MG tablet TAKE 1 TABLET DAILY  90 tablet  1  . furosemide (LASIX) 20 MG tablet Take 20 mg by mouth daily as needed.       . hydroxychloroquine (PLAQUENIL) 200 MG tablet Take 200 mg by mouth daily.      . predniSONE (DELTASONE) 5 MG tablet Take 5 mg by mouth daily.       . theophylline (THEO-24) 100 MG 24 hr capsule Take 100 mg by mouth 2 (two) times daily.       Marland Kitchen tiotropium (SPIRIVA) 18 MCG inhalation capsule Place 18 mcg into inhaler and inhale daily.      . traMADol (ULTRAM) 50 MG tablet TAKE ONE TABLET BY MOUTH ONCE DAILY  30 tablet  0   . [DISCONTINUED] Fluticasone-Salmeterol (ADVAIR) 250-50 MCG/DOSE AEPB Inhale 1 puff into the lungs every 12 (twelve) hours.      . [DISCONTINUED] omeprazole (PRILOSEC) 40 MG capsule TAKE ONE CAPSULE BY MOUTH EVERY DAY  30 capsule  5  . fluconazole (DIFLUCAN) 100 MG tablet Take 1 tablet (100 mg total) by mouth daily.  7 tablet  0  . Fluticasone Furoate-Vilanterol (BREO ELLIPTA) 100-25 MCG/INH AEPB Inhale 1 puff into the lungs daily.  60 each  6  . ipratropium (ATROVENT) 0.02 % nebulizer solution Take 500 mcg by nebulization 2 (two) times daily as needed.      Marland Kitchen ipratropium-albuterol (DUONEB) 0.5-2.5 (3) MG/3ML SOLN       . lidocaine (LIDODERM) 5 % Place 1 patch onto the skin daily. Remove & Discard patch within 12 hours or as directed by MD  30 patch  0  . NEXIUM 40 MG capsule       . omeprazole (PRILOSEC) 40 MG capsule Take 1 capsule (40 mg total) by mouth daily.  90 capsule  1   No facility-administered encounter medications on file as of 11/14/2012.   BP 116/66  Pulse 71  Temp(Src) 98.2 F (36.8 C) (Oral)  Wt 157 lb (71.215 kg)  BMI 24.96 kg/m2  SpO2 95%  Review of Systems  Constitutional: Positive for fatigue. Negative for fever, chills, activity change, appetite change and unexpected weight change.  Eyes: Negative for visual disturbance.  Respiratory: Positive for cough, shortness of breath (chronic) and wheezing.   Cardiovascular: Negative for chest pain, palpitations and leg swelling.  Gastrointestinal: Negative for abdominal pain and abdominal distention.  Genitourinary: Negative for dysuria, urgency and difficulty urinating.  Musculoskeletal: Positive for back pain and arthralgias. Negative for gait problem.  Skin: Negative for color change and rash.  Hematological: Negative for adenopathy.  Psychiatric/Behavioral: Negative for sleep disturbance and dysphoric mood. The patient is not nervous/anxious.        Objective:   Physical Exam  Constitutional: He is oriented to  person, place, and time. He appears well-developed and well-nourished. No distress.  HENT:  Head: Normocephalic and atraumatic.  Right Ear: External ear normal.  Left Ear: External ear normal.  Nose: Nose normal.  Mouth/Throat: Oropharynx is clear and moist. No oropharyngeal exudate.  Eyes: Conjunctivae and EOM are normal. Pupils are equal, round, and reactive to light. Right eye exhibits no discharge. Left eye exhibits no discharge. No scleral icterus.  Neck: Normal range of motion. Neck supple. No tracheal deviation present. No thyromegaly present.  Cardiovascular: Normal rate, regular rhythm and normal heart sounds.  Exam reveals no gallop and no friction rub.   No murmur heard. Pulmonary/Chest: Effort normal. No accessory muscle usage. Not tachypneic. No respiratory distress. He has decreased breath sounds. He has no wheezes. He has no rhonchi. He has no rales. He exhibits deformity (barrell chest). He exhibits no tenderness.  Musculoskeletal: Normal range of motion. He exhibits no edema.       Lumbar back: He exhibits tenderness and pain. He exhibits normal range of motion, no bony tenderness and no edema.  Lymphadenopathy:    He has no cervical adenopathy.  Neurological: He is alert and oriented to person, place, and time. No cranial nerve deficit. Coordination normal.  Skin: Skin is warm and dry. No rash noted. He is not diaphoretic. No erythema. No pallor.  Psychiatric: He has a normal mood and affect. His behavior is normal. Judgment and thought content normal.          Assessment & Plan:

## 2012-11-14 NOTE — Assessment & Plan Note (Signed)
Symptomatically doing well on Embrel and Plaquenil. Followed by Rheumatologist. Will continue.

## 2012-11-15 ENCOUNTER — Encounter: Payer: Self-pay | Admitting: *Deleted

## 2012-11-16 ENCOUNTER — Telehealth: Payer: Self-pay | Admitting: *Deleted

## 2012-11-16 DIAGNOSIS — J449 Chronic obstructive pulmonary disease, unspecified: Secondary | ICD-10-CM

## 2012-11-16 NOTE — Telephone Encounter (Signed)
Refill Request  Symbicort INH Aerosol   Dulera Inhaler 13 GM

## 2012-11-20 MED ORDER — FLUTICASONE FUROATE-VILANTEROL 100-25 MCG/INH IN AEPB: 1.0000 | INHALATION_SPRAY | Freq: Every day | RESPIRATORY_TRACT | Status: DC

## 2012-11-20 NOTE — Telephone Encounter (Signed)
Left message to call back  

## 2012-11-20 NOTE — Telephone Encounter (Signed)
I do not see either one of those on his med list, is this something you are aware he is taking? Ok to refill?

## 2012-11-20 NOTE — Telephone Encounter (Signed)
Spoke with patient, he does not want or use Dulera and Symbicort. He would like Rx for [facility] sent to [facility]

## 2012-11-20 NOTE — Telephone Encounter (Signed)
This does not make sense. At his last visit, he told me he was taking Breo. Can you confirm if his pulmonologist changed his meds?

## 2012-12-05 DIAGNOSIS — J441 Chronic obstructive pulmonary disease with (acute) exacerbation: Secondary | ICD-10-CM | POA: Diagnosis not present

## 2012-12-05 DIAGNOSIS — Z006 Encounter for examination for normal comparison and control in clinical research program: Secondary | ICD-10-CM | POA: Diagnosis not present

## 2012-12-05 DIAGNOSIS — J9610 Chronic respiratory failure, unspecified whether with hypoxia or hypercapnia: Secondary | ICD-10-CM | POA: Diagnosis not present

## 2012-12-05 DIAGNOSIS — F17200 Nicotine dependence, unspecified, uncomplicated: Secondary | ICD-10-CM | POA: Diagnosis not present

## 2012-12-05 DIAGNOSIS — G4710 Hypersomnia, unspecified: Secondary | ICD-10-CM | POA: Diagnosis not present

## 2012-12-05 DIAGNOSIS — J438 Other emphysema: Secondary | ICD-10-CM | POA: Diagnosis not present

## 2013-01-09 ENCOUNTER — Telehealth: Payer: Self-pay | Admitting: Internal Medicine

## 2013-01-09 NOTE — Telephone Encounter (Signed)
Ok to call this in for him?

## 2013-01-09 NOTE — Telephone Encounter (Signed)
The patient is wanting a prescripton called to the pharmacy for generic Zantac

## 2013-01-09 NOTE — Telephone Encounter (Signed)
That is fine Zantac 150mg  po daily #30 6 refill.

## 2013-01-10 MED ORDER — RANITIDINE HCL 150 MG PO TABS: 150.0000 mg | ORAL_TABLET | Freq: Every day | ORAL | Status: DC

## 2013-01-10 NOTE — Telephone Encounter (Signed)
Rx sent to the pharmacy.

## 2013-01-30 DIAGNOSIS — M069 Rheumatoid arthritis, unspecified: Secondary | ICD-10-CM | POA: Diagnosis not present

## 2013-01-30 DIAGNOSIS — M25579 Pain in unspecified ankle and joints of unspecified foot: Secondary | ICD-10-CM | POA: Diagnosis not present

## 2013-02-18 ENCOUNTER — Ambulatory Visit (INDEPENDENT_AMBULATORY_CARE_PROVIDER_SITE_OTHER): Payer: Medicare Other | Admitting: Internal Medicine

## 2013-02-18 ENCOUNTER — Encounter: Payer: Self-pay | Admitting: Internal Medicine

## 2013-02-18 VITALS — BP 108/58 | HR 82 | Temp 98.0°F | Resp 16 | Ht 66.5 in | Wt 163.0 lb

## 2013-02-18 DIAGNOSIS — M069 Rheumatoid arthritis, unspecified: Secondary | ICD-10-CM | POA: Diagnosis not present

## 2013-02-18 DIAGNOSIS — J449 Chronic obstructive pulmonary disease, unspecified: Secondary | ICD-10-CM

## 2013-02-18 DIAGNOSIS — Z720 Tobacco use: Secondary | ICD-10-CM | POA: Insufficient documentation

## 2013-02-18 DIAGNOSIS — B370 Candidal stomatitis: Secondary | ICD-10-CM | POA: Diagnosis not present

## 2013-02-18 DIAGNOSIS — F17200 Nicotine dependence, unspecified, uncomplicated: Secondary | ICD-10-CM

## 2013-02-18 MED ORDER — BUPROPION HCL ER (XL) 150 MG PO TB24: 150.0000 mg | ORAL_TABLET | Freq: Every day | ORAL | Status: DC

## 2013-02-18 MED ORDER — FLUCONAZOLE 100 MG PO TABS: 100.0000 mg | ORAL_TABLET | Freq: Every day | ORAL | Status: DC

## 2013-02-18 NOTE — Assessment & Plan Note (Signed)
Severe progressive COPD. Strongly encourage smoking cessation. Will start Wellbutrin to help with smoking cessation. He will also use nicotine replacement as supplied by his insurance company. We'll continue inhaled bronchodilators, inhaled steroids, and prednisone 5 mg daily. Followup 3 months or sooner as needed.

## 2013-02-18 NOTE — Assessment & Plan Note (Signed)
Symptoms have been well-controlled with use of Embrel. Will continue.

## 2013-02-18 NOTE — Assessment & Plan Note (Signed)
Strongly encouraged smoking cessation. Will start Wellbutrin to help with smoking cessation. Encouraged him to use nicotine replacement as prescribed.

## 2013-02-18 NOTE — Patient Instructions (Addendum)
Start Wellbutrin 150mg  daily in the morning. This will help with anxiety and help with cravings for cigarettes. Use Nicotine patch as directed.  Smoking Cessation Quitting smoking is important to your health and has many advantages. However, it is not always easy to quit since nicotine is a very addictive drug. Often times, people try 3 times or more before being able to quit. This document explains the best ways for you to prepare to quit smoking. Quitting takes hard work and a lot of effort, but you can do it. ADVANTAGES OF QUITTING SMOKING  You will live longer, feel better, and live better.  Your body will feel the impact of quitting smoking almost immediately.  Within 20 minutes, blood pressure decreases. Your pulse returns to its normal level.  After 8 hours, carbon monoxide levels in the blood return to normal. Your oxygen level increases.  After 24 hours, the chance of having a heart attack starts to decrease. Your breath, hair, and body stop smelling like smoke.  After 48 hours, damaged nerve endings begin to recover. Your sense of taste and smell improve.  After 72 hours, the body is virtually free of nicotine. Your bronchial tubes relax and breathing becomes easier.  After 2 to 12 weeks, lungs can hold more air. Exercise becomes easier and circulation improves.  The risk of having a heart attack, stroke, cancer, or lung disease is greatly reduced.  After 1 year, the risk of coronary heart disease is cut in half.  After 5 years, the risk of stroke falls to the same as a nonsmoker.  After 10 years, the risk of lung cancer is cut in half and the risk of other cancers decreases significantly.  After 15 years, the risk of coronary heart disease drops, usually to the level of a nonsmoker.  If you are pregnant, quitting smoking will improve your chances of having a healthy baby.  The people you live with, especially any children, will be healthier.  You will have extra money  to spend on things other than cigarettes. QUESTIONS TO THINK ABOUT BEFORE ATTEMPTING TO QUIT You may want to talk about your answers with your caregiver.  Why do you want to quit?  If you tried to quit in the past, what helped and what did not?  What will be the most difficult situations for you after you quit? How will you plan to handle them?  Who can help you through the tough times? Your family? Friends? A caregiver?  What pleasures do you get from smoking? What ways can you still get pleasure if you quit? Here are some questions to ask your caregiver:  How can you help me to be successful at quitting?  What medicine do you think would be best for me and how should I take it?  What should I do if I need more help?  What is smoking withdrawal like? How can I get information on withdrawal? GET READY  Set a quit date.  Change your environment by getting rid of all cigarettes, ashtrays, matches, and lighters in your home, car, or work. Do not let people smoke in your home.  Review your past attempts to quit. Think about what worked and what did not. GET SUPPORT AND ENCOURAGEMENT You have a better chance of being successful if you have help. You can get support in many ways.  Tell your family, friends, and co-workers that you are going to quit and need their support. Ask them not to smoke around you.  Get individual, group, or telephone counseling and support. Programs are available at [employer] and health centers. Call your local health department for information about programs in your area.  Spiritual beliefs and practices may help some smokers quit.  Download a "quit meter" on your computer to keep track of quit statistics, such as how long you have gone without smoking, cigarettes not smoked, and money saved.  Get a self-help book about quitting smoking and staying off of tobacco. LEARN NEW SKILLS AND BEHAVIORS  Distract yourself from urges to smoke. Talk to  someone, go for a walk, or occupy your time with a task.  Change your normal routine. Take a different route to work. Drink tea instead of coffee. Eat breakfast in a different place.  Reduce your stress. Take a hot bath, exercise, or read a book.  Plan something enjoyable to do every day. Reward yourself for not smoking.  Explore interactive web-based programs that specialize in helping you quit. GET MEDICINE AND USE IT CORRECTLY Medicines can help you stop smoking and decrease the urge to smoke. Combining medicine with the above behavioral methods and support can greatly increase your chances of successfully quitting smoking.  Nicotine replacement therapy helps deliver nicotine to your body without the negative effects and risks of smoking. Nicotine replacement therapy includes nicotine gum, lozenges, inhalers, nasal sprays, and skin patches. Some may be available over-the-counter and others require a prescription.  Antidepressant medicine helps people abstain from smoking, but how this works is unknown. This medicine is available by prescription.  Nicotinic receptor partial agonist medicine simulates the effect of nicotine in your brain. This medicine is available by prescription. Ask your caregiver for advice about which medicines to use and how to use them based on your health history. Your caregiver will tell you what side effects to look out for if you choose to be on a medicine or therapy. Carefully read the information on the package. Do not use any other product containing nicotine while using a nicotine replacement product.  RELAPSE OR DIFFICULT SITUATIONS Most relapses occur within the first 3 months after quitting. Do not be discouraged if you start smoking again. Remember, most people try several times before finally quitting. You may have symptoms of withdrawal because your body is used to nicotine. You may crave cigarettes, be irritable, feel very hungry, cough often, get headaches,  or have difficulty concentrating. The withdrawal symptoms are only temporary. They are strongest when you first quit, but they will go away within 10 14 days. To reduce the chances of relapse, try to:  Avoid drinking alcohol. Drinking lowers your chances of successfully quitting.  Reduce the amount of caffeine you consume. Once you quit smoking, the amount of caffeine in your body increases and can give you symptoms, such as a rapid heartbeat, sweating, and anxiety.  Avoid smokers because they can make you want to smoke.  Do not let weight gain distract you. Many smokers will gain weight when they quit, usually less than 10 pounds. Eat a healthy diet and stay active. You can always lose the weight gained after you quit.  Find ways to improve your mood other than smoking. FOR MORE INFORMATION  www.smokefree.gov  Document Released: 03/08/2001 Document Revised: 09/13/2011 Document Reviewed: 06/23/2011 [facility] Patient Information 2014 [place], [place].

## 2013-02-18 NOTE — Progress Notes (Signed)
Subjective:    Patient ID: Peter Johnston, male    DOB: 1940-07-31, 72 y.o.   MRN: [id number]  HPI 72 year old male with history of tobacco abuse, COPD presents for followup. He reports that he is back smoking about a pack and half of cigarettes per day. He has noted significant worsening of his breathing. He typically is using 3-4 L of oxygen by nasal cannula. Despite this, he feels short of breath both at rest or with minimal exertion. He notes occasional cough, most prominent in the morning. This is productive of whitish sputum. He denies any recent fever or chills. He was recently seen by his pulmonologist but declined having pulmonary function testing. He reports some improvement in symptoms of shortness of breath with the use of Breo. He contacted his insurance company and they're willing to pay for nicotine replacement to help him quit smoking.  Outpatient Encounter Prescriptions as of 02/18/2013  Medication Sig  . albuterol (PROVENTIL) (2.5 MG/3ML) 0.083% nebulizer solution Take 3 mLs (2.5 mg total) by nebulization every 6 (six) hours as needed for wheezing.  Marland Kitchen allopurinol (ZYLOPRIM) 300 MG tablet Take 300 mg by mouth daily.  Marland Kitchen atenolol (TENORMIN) 12.5 mg TABS Take 0.5 tablets (12.5 mg total) by mouth daily.  . ENBREL 50 MG/ML injection   . fenofibrate (TRICOR) 145 MG tablet TAKE 1 TABLET DAILY  . fluconazole (DIFLUCAN) 100 MG tablet Take 1 tablet (100 mg total) by mouth daily.  . Fluticasone Furoate-Vilanterol (BREO ELLIPTA) 100-25 MCG/INH AEPB Inhale 1 puff into the lungs daily.  . furosemide (LASIX) 20 MG tablet Take 20 mg by mouth daily as needed.   . hydroxychloroquine (PLAQUENIL) 200 MG tablet Take 200 mg by mouth daily.  Marland Kitchen ipratropium (ATROVENT) 0.02 % nebulizer solution Take 500 mcg by nebulization 2 (two) times daily as needed.  Marland Kitchen ipratropium-albuterol (DUONEB) 0.5-2.5 (3) MG/3ML SOLN   . lidocaine (LIDODERM) 5 % Place 1 patch onto the skin daily. Remove & Discard patch within 12  hours or as directed by MD  . NEXIUM 40 MG capsule   . omeprazole (PRILOSEC) 40 MG capsule Take 1 capsule (40 mg total) by mouth daily.  . predniSONE (DELTASONE) 5 MG tablet Take 5 mg by mouth daily.   . ranitidine (ZANTAC) 150 MG tablet Take 1 tablet (150 mg total) by mouth daily.  . theophylline (THEO-24) 100 MG 24 hr capsule Take 100 mg by mouth 2 (two) times daily.   Marland Kitchen tiotropium (SPIRIVA) 18 MCG inhalation capsule Place 18 mcg into inhaler and inhale daily.  . traMADol (ULTRAM) 50 MG tablet TAKE ONE TABLET BY MOUTH ONCE DAILY  . [DISCONTINUED] fluconazole (DIFLUCAN) 100 MG tablet Take 1 tablet (100 mg total) by mouth daily.  Marland Kitchen buPROPion (WELLBUTRIN XL) 150 MG 24 hr tablet Take 1 tablet (150 mg total) by mouth daily.   BP 108/58  Pulse 82  Temp(Src) 98 F (36.7 C) (Oral)  Resp 16  Ht 5' 6.5" (1.689 m)  Wt 163 lb (73.936 kg)  BMI 25.92 kg/m2  SpO2 94%  Review of Systems  Constitutional: Positive for fatigue. Negative for fever, chills, activity change, appetite change and unexpected weight change.  Eyes: Negative for visual disturbance.  Respiratory: Positive for cough, chest tightness, shortness of breath and wheezing.   Cardiovascular: Negative for chest pain, palpitations and leg swelling.  Gastrointestinal: Negative for abdominal pain and abdominal distention.  Genitourinary: Negative for dysuria, urgency and difficulty urinating.  Musculoskeletal: Negative for arthralgias and gait problem.  Skin: Negative  for color change and rash.  Hematological: Negative for adenopathy.  Psychiatric/Behavioral: Negative for sleep disturbance and dysphoric mood. The patient is not nervous/anxious.        Objective:   Physical Exam  Constitutional: He is oriented to person, place, and time. He appears well-developed and well-nourished. No distress.  HENT:  Head: Normocephalic and atraumatic.  Right Ear: External ear normal.  Left Ear: External ear normal.  Nose: Nose normal.   Mouth/Throat: Oropharynx is clear and moist. No oropharyngeal exudate.  Eyes: Conjunctivae and EOM are normal. Pupils are equal, round, and reactive to light. Right eye exhibits no discharge. Left eye exhibits no discharge. No scleral icterus.  Neck: Normal range of motion. Neck supple. No tracheal deviation present. No thyromegaly present.  Cardiovascular: Normal rate, regular rhythm and normal heart sounds.  Exam reveals no gallop and no friction rub.   No murmur heard. Pulmonary/Chest: Accessory muscle usage present. Tachypnea noted. No respiratory distress. He has decreased breath sounds. He has no wheezes. He has no rhonchi. He has no rales. He exhibits no tenderness.  Musculoskeletal: Normal range of motion. He exhibits no edema.  Lymphadenopathy:    He has no cervical adenopathy.  Neurological: He is alert and oriented to person, place, and time. No cranial nerve deficit. Coordination normal.  Skin: Skin is warm and dry. No rash noted. He is not diaphoretic. No erythema. No pallor.  Psychiatric: He has a normal mood and affect. His behavior is normal. Judgment and thought content normal.          Assessment & Plan:

## 2013-03-05 ENCOUNTER — Telehealth: Payer: Self-pay | Admitting: *Deleted

## 2013-03-05 DIAGNOSIS — I10 Essential (primary) hypertension: Secondary | ICD-10-CM

## 2013-03-05 NOTE — Telephone Encounter (Signed)
That is fine 

## 2013-03-05 NOTE — Telephone Encounter (Signed)
Requesting refill on Atenolol 25 mg. He been taking a full tablet because when he cuts it up it crumbles. Would like a refill sent to [facility], will this be ok?

## 2013-03-06 MED ORDER — ATENOLOL 25 MG PO TABS: 25.0000 mg | ORAL_TABLET | Freq: Every day | ORAL | Status: DC

## 2013-03-06 NOTE — Telephone Encounter (Signed)
Prescription sent to [facility] per patient request.

## 2013-03-07 DIAGNOSIS — J9610 Chronic respiratory failure, unspecified whether with hypoxia or hypercapnia: Secondary | ICD-10-CM | POA: Diagnosis not present

## 2013-03-07 DIAGNOSIS — F17200 Nicotine dependence, unspecified, uncomplicated: Secondary | ICD-10-CM | POA: Diagnosis not present

## 2013-03-07 DIAGNOSIS — J449 Chronic obstructive pulmonary disease, unspecified: Secondary | ICD-10-CM | POA: Diagnosis not present

## 2013-03-07 DIAGNOSIS — J438 Other emphysema: Secondary | ICD-10-CM | POA: Diagnosis not present

## 2013-04-08 DIAGNOSIS — H4010X0 Unspecified open-angle glaucoma, stage unspecified: Secondary | ICD-10-CM | POA: Diagnosis not present

## 2013-04-10 ENCOUNTER — Other Ambulatory Visit: Payer: Self-pay | Admitting: Internal Medicine

## 2013-05-09 ENCOUNTER — Other Ambulatory Visit: Payer: Self-pay | Admitting: *Deleted

## 2013-05-09 ENCOUNTER — Telehealth: Payer: Self-pay | Admitting: Internal Medicine

## 2013-05-09 MED ORDER — TRAMADOL HCL 50 MG PO TABS: 50.0000 mg | ORAL_TABLET | Freq: Two times a day (BID) | ORAL | Status: DC | PRN

## 2013-05-09 NOTE — Telephone Encounter (Signed)
Ok refill? 

## 2013-05-09 NOTE — Telephone Encounter (Signed)
traMADol (ULTRAM) 50 MG tablet

## 2013-05-10 ENCOUNTER — Other Ambulatory Visit: Payer: Self-pay | Admitting: *Deleted

## 2013-05-10 MED ORDER — TRAMADOL HCL 50 MG PO TABS: 50.0000 mg | ORAL_TABLET | Freq: Two times a day (BID) | ORAL | Status: DC | PRN

## 2013-05-10 NOTE — Telephone Encounter (Signed)
Tramadol was faxed to [facility]. Pt is also wanting a Rx to go to [facility], ok?

## 2013-05-10 NOTE — Telephone Encounter (Signed)
The patient is completely out of his Tramadol and his is wanting a prescription sent to [facility] [address]. And a prescription sent to his mail order.

## 2013-05-10 NOTE — Telephone Encounter (Signed)
That is fine 

## 2013-05-10 NOTE — Telephone Encounter (Signed)
Faxed to [facility] and the other one was faxed to [facility] yesterday.

## 2013-05-10 NOTE — Telephone Encounter (Signed)
Pt calling to check status of medication.

## 2013-05-30 ENCOUNTER — Encounter: Payer: Self-pay | Admitting: *Deleted

## 2013-05-31 ENCOUNTER — Encounter: Payer: Self-pay | Admitting: Internal Medicine

## 2013-05-31 ENCOUNTER — Ambulatory Visit (INDEPENDENT_AMBULATORY_CARE_PROVIDER_SITE_OTHER): Payer: Medicare Other | Admitting: Internal Medicine

## 2013-05-31 VITALS — BP 144/82 | HR 84 | Temp 98.5°F | Wt 164.0 lb

## 2013-05-31 DIAGNOSIS — I10 Essential (primary) hypertension: Secondary | ICD-10-CM

## 2013-05-31 DIAGNOSIS — J449 Chronic obstructive pulmonary disease, unspecified: Secondary | ICD-10-CM

## 2013-05-31 DIAGNOSIS — J441 Chronic obstructive pulmonary disease with (acute) exacerbation: Secondary | ICD-10-CM

## 2013-05-31 DIAGNOSIS — F17200 Nicotine dependence, unspecified, uncomplicated: Secondary | ICD-10-CM

## 2013-05-31 DIAGNOSIS — J4489 Other specified chronic obstructive pulmonary disease: Secondary | ICD-10-CM

## 2013-05-31 DIAGNOSIS — Z720 Tobacco use: Secondary | ICD-10-CM

## 2013-05-31 LAB — COMPREHENSIVE METABOLIC PANEL
ALK PHOS: 41 U/L (ref 39–117)
ALT: 13 U/L (ref 0–53)
AST: 15 U/L (ref 0–37)
Albumin: 3.7 g/dL (ref 3.5–5.2)
BUN: 17 mg/dL (ref 6–23)
CO2: 26 mEq/L (ref 19–32)
Calcium: 9.4 mg/dL (ref 8.4–10.5)
Chloride: 102 mEq/L (ref 96–112)
Creatinine, Ser: 1 mg/dL (ref 0.4–1.5)
GFR: 81.33 mL/min (ref >60.00)
Glucose, Bld: 89 mg/dL (ref 70–99)
POTASSIUM: 4.6 meq/L (ref 3.5–5.1)
Sodium: 137 mEq/L (ref 135–145)
Total Bilirubin: 0.5 mg/dL (ref 0.3–1.2)
Total Protein: 6.8 g/dL (ref 6.0–8.3)

## 2013-05-31 MED ORDER — LEVOFLOXACIN 750 MG PO TABS: 750.0000 mg | ORAL_TABLET | Freq: Every day | ORAL | Status: AC

## 2013-05-31 MED ORDER — PREDNISONE 10 MG PO TABS: ORAL_TABLET | ORAL | Status: DC

## 2013-05-31 NOTE — Assessment & Plan Note (Signed)
BP Readings from Last 3 Encounters:  05/31/13 144/82  02/18/13 108/58  11/14/12 116/66   BP slightly elevated today, however has been well controlled at home and previous visits. Will continue current medications. Renal function stable on labs today.

## 2013-05-31 NOTE — Assessment & Plan Note (Signed)
Symptoms consistent with COPD exacerbation. Will start Levaquin and prednisone taper. Encouraged smoking cessation. Continue Breo, Spiriva and prn albuterol. Follow up for recheck in 2-4 week and prn if symptoms are not improving. Will plan to give Prevnar at follow up visit.

## 2013-05-31 NOTE — Progress Notes (Signed)
Subjective:    Patient ID: Peter Johnston, male    DOB: 02-05-1941, 73 y.o.   MRN: [id number]  HPI 73YO male with COPD presents for follow up.  Notes recent increased shortness of breath, increased productive cough, yellow in color, over last couple of weeks. No fever, chills. Taking Breo as directed. Continues to smoke about 1/2 PPD and use vapor e-cig.  Compliant with medications. No recent chest pain, headache, palpitations.    Review of Systems  Constitutional: Positive for fatigue. Negative for fever, chills and activity change.  HENT: Negative for congestion, ear discharge, ear pain, hearing loss, nosebleeds, postnasal drip, rhinorrhea, sinus pressure, sneezing, sore throat, tinnitus, trouble swallowing and voice change.   Eyes: Negative for discharge, redness, itching and visual disturbance.  Respiratory: Positive for cough, chest tightness, shortness of breath and wheezing. Negative for stridor.   Cardiovascular: Negative for chest pain and leg swelling.  Musculoskeletal: Negative for arthralgias, myalgias, neck pain and neck stiffness.  Skin: Negative for color change and rash.  Neurological: Negative for dizziness, facial asymmetry and headaches.  Psychiatric/Behavioral: Negative for sleep disturbance.       Objective:    BP 144/82  Pulse 84  Temp(Src) 98.5 F (36.9 C)  Wt 164 lb (74.39 kg)  SpO2 94% Physical Exam  Constitutional: He is oriented to person, place, and time. He appears well-developed and well-nourished. No distress.  HENT:  Head: Normocephalic and atraumatic.  Right Ear: External ear normal.  Left Ear: External ear normal.  Nose: Nose normal.  Mouth/Throat: Oropharynx is clear and moist. No oropharyngeal exudate.  Eyes: Conjunctivae and EOM are normal. Pupils are equal, round, and reactive to light. Right eye exhibits no discharge. Left eye exhibits no discharge. No scleral icterus.  Neck: Normal range of motion. Neck supple. No tracheal deviation  present. No thyromegaly present.  Cardiovascular: Normal rate, regular rhythm and normal heart sounds.  Exam reveals no gallop and no friction rub.   No murmur heard. Pulmonary/Chest: Accessory muscle usage present. Tachypnea noted. No respiratory distress. He has decreased breath sounds. He has wheezes (few scattered). He has no rhonchi. He has no rales. He exhibits no tenderness.  Musculoskeletal: Normal range of motion. He exhibits no edema.  Lymphadenopathy:    He has no cervical adenopathy.  Neurological: He is alert and oriented to person, place, and time. No cranial nerve deficit. Coordination normal.  Skin: Skin is warm and dry. No rash noted. He is not diaphoretic. No erythema. No pallor.  Psychiatric: He has a normal mood and affect. His behavior is normal. Judgment and thought content normal.          Assessment & Plan:   Problem List Items Addressed This Visit   COPD exacerbation - Primary     Symptoms consistent with COPD exacerbation. Will start Levaquin and prednisone taper. Encouraged smoking cessation. Continue Breo, Spiriva and prn albuterol. Follow up for recheck in 2-4 week and prn if symptoms are not improving. Will plan to give Prevnar at follow up visit.    Relevant Medications      predniSONE (DELTASONE) tablet      levofloxacin (LEVAQUIN) tablet   COPD, severe   Hypertension      BP Readings from Last 3 Encounters:  05/31/13 144/82  02/18/13 108/58  11/14/12 116/66   BP slightly elevated today, however has been well controlled at home and previous visits. Will continue current medications. Renal function stable on labs today.    Relevant Orders  Comp Met (CMET) (Completed)   Tobacco abuse       Return in about 6 months (around 12/01/2013) for Physical.

## 2013-05-31 NOTE — Progress Notes (Signed)
Pre visit review using our clinic review tool, if applicable. No additional management support is needed unless otherwise documented below in the visit note. 

## 2013-06-01 ENCOUNTER — Telehealth: Payer: Self-pay | Admitting: Internal Medicine

## 2013-06-01 NOTE — Telephone Encounter (Signed)
Relevant patient education mailed to patient.  

## 2013-06-03 ENCOUNTER — Telehealth: Payer: Self-pay | Admitting: Internal Medicine

## 2013-06-03 ENCOUNTER — Encounter: Payer: Self-pay | Admitting: *Deleted

## 2013-06-03 NOTE — Telephone Encounter (Signed)
Relevant patient education mailed to patient.  

## 2013-06-05 DIAGNOSIS — F17200 Nicotine dependence, unspecified, uncomplicated: Secondary | ICD-10-CM | POA: Diagnosis not present

## 2013-06-05 DIAGNOSIS — R0602 Shortness of breath: Secondary | ICD-10-CM | POA: Diagnosis not present

## 2013-06-05 DIAGNOSIS — J449 Chronic obstructive pulmonary disease, unspecified: Secondary | ICD-10-CM | POA: Diagnosis not present

## 2013-06-05 DIAGNOSIS — J9610 Chronic respiratory failure, unspecified whether with hypoxia or hypercapnia: Secondary | ICD-10-CM | POA: Diagnosis not present

## 2013-06-18 ENCOUNTER — Ambulatory Visit: Payer: Medicare Other | Admitting: Internal Medicine

## 2013-07-02 DIAGNOSIS — M545 Low back pain, unspecified: Secondary | ICD-10-CM | POA: Diagnosis not present

## 2013-07-02 DIAGNOSIS — M810 Age-related osteoporosis without current pathological fracture: Secondary | ICD-10-CM | POA: Diagnosis not present

## 2013-07-02 DIAGNOSIS — M069 Rheumatoid arthritis, unspecified: Secondary | ICD-10-CM | POA: Diagnosis not present

## 2013-07-05 DIAGNOSIS — M899 Disorder of bone, unspecified: Secondary | ICD-10-CM | POA: Diagnosis not present

## 2013-07-05 DIAGNOSIS — Z79899 Other long term (current) drug therapy: Secondary | ICD-10-CM | POA: Diagnosis not present

## 2013-07-05 DIAGNOSIS — M949 Disorder of cartilage, unspecified: Secondary | ICD-10-CM | POA: Diagnosis not present

## 2013-07-11 ENCOUNTER — Ambulatory Visit: Payer: Self-pay | Admitting: Rheumatology

## 2013-07-11 DIAGNOSIS — M48061 Spinal stenosis, lumbar region without neurogenic claudication: Secondary | ICD-10-CM | POA: Diagnosis not present

## 2013-07-11 DIAGNOSIS — S32009A Unspecified fracture of unspecified lumbar vertebra, initial encounter for closed fracture: Secondary | ICD-10-CM | POA: Diagnosis not present

## 2013-07-11 DIAGNOSIS — M47817 Spondylosis without myelopathy or radiculopathy, lumbosacral region: Secondary | ICD-10-CM | POA: Diagnosis not present

## 2013-07-11 DIAGNOSIS — M5137 Other intervertebral disc degeneration, lumbosacral region: Secondary | ICD-10-CM | POA: Diagnosis not present

## 2013-07-23 ENCOUNTER — Telehealth: Payer: Self-pay | Admitting: *Deleted

## 2013-07-23 NOTE — Telephone Encounter (Signed)
Patient's relative walked in requesting samples of Brevo inhaler or any other kind we may have on hand. Informed her we do not have any inhaler samples as of now and we will not be doing samples anymore. She agreed to pass this information on to the patient.

## 2013-07-24 DIAGNOSIS — M4800 Spinal stenosis, site unspecified: Secondary | ICD-10-CM | POA: Diagnosis not present

## 2013-07-24 DIAGNOSIS — S32009A Unspecified fracture of unspecified lumbar vertebra, initial encounter for closed fracture: Secondary | ICD-10-CM | POA: Diagnosis not present

## 2013-08-02 DIAGNOSIS — M48062 Spinal stenosis, lumbar region with neurogenic claudication: Secondary | ICD-10-CM | POA: Diagnosis not present

## 2013-08-02 DIAGNOSIS — IMO0002 Reserved for concepts with insufficient information to code with codable children: Secondary | ICD-10-CM | POA: Diagnosis not present

## 2013-08-02 DIAGNOSIS — M47817 Spondylosis without myelopathy or radiculopathy, lumbosacral region: Secondary | ICD-10-CM | POA: Diagnosis not present

## 2013-08-10 ENCOUNTER — Other Ambulatory Visit: Payer: Self-pay | Admitting: Internal Medicine

## 2013-08-15 DIAGNOSIS — M810 Age-related osteoporosis without current pathological fracture: Secondary | ICD-10-CM | POA: Diagnosis not present

## 2013-08-21 ENCOUNTER — Encounter: Payer: Self-pay | Admitting: Adult Health

## 2013-08-21 ENCOUNTER — Ambulatory Visit (INDEPENDENT_AMBULATORY_CARE_PROVIDER_SITE_OTHER): Payer: Medicare Other | Admitting: Adult Health

## 2013-08-21 VITALS — BP 130/72 | HR 92 | Temp 98.3°F | Resp 16 | Ht 66.5 in | Wt 158.2 lb

## 2013-08-21 DIAGNOSIS — R197 Diarrhea, unspecified: Secondary | ICD-10-CM

## 2013-08-21 NOTE — Progress Notes (Signed)
Patient ID: Peter Johnston, male   DOB: [birth year], 73 y.o.   MRN: [id number]   Subjective:    Patient ID: Peter Johnston, male    DOB: [birth year], 73 y.o.   MRN: [id number]  HPI Pt is a pleasant 73 y/o male who presents to clinic with "stomach issues". He reports having cramping and diarrhea that started ~ 2 weeks ago. Had irritated hemorrhoid which he reports bled. This has subsided as well as the diarrheas has improved but not completely resolved. He has felt a little nauseated but no vomiting. He denies any fever or chills. Cannot recall any unusual foods that may have triggered event. Has abdominal discomfort across his lower abdomen.    Past Medical History  Diagnosis Date  . COPD (chronic obstructive pulmonary disease)   . Arthritis   . Hypertension     Current Outpatient Prescriptions on File Prior to Visit  Medication Sig Dispense Refill  . allopurinol (ZYLOPRIM) 300 MG tablet Take 300 mg by mouth daily.      Marland Kitchen atenolol (TENORMIN) 25 MG tablet TAKE 1 TABLET DAILY  90 tablet  1  . ENBREL 50 MG/ML injection       . fenofibrate (TRICOR) 145 MG tablet TAKE 1 TABLET DAILY  90 tablet  1  . Fluticasone Furoate-Vilanterol (BREO ELLIPTA) 100-25 MCG/INH AEPB Inhale 1 puff into the lungs daily.  60 each  6  . furosemide (LASIX) 20 MG tablet Take 20 mg by mouth daily as needed.       . hydroxychloroquine (PLAQUENIL) 200 MG tablet Take 200 mg by mouth daily.      Marland Kitchen ipratropium (ATROVENT) 0.02 % nebulizer solution Take 500 mcg by nebulization 2 (two) times daily as needed.      Marland Kitchen ipratropium-albuterol (DUONEB) 0.5-2.5 (3) MG/3ML SOLN       . lidocaine (LIDODERM) 5 % Place 1 patch onto the skin daily. Remove & Discard patch within 12 hours or as directed by MD  30 patch  0  . NEXIUM 40 MG capsule       . omeprazole (PRILOSEC) 40 MG capsule Take 1 capsule (40 mg total) by mouth daily.  90 capsule  1  . predniSONE (DELTASONE) 10 MG tablet Take 60mg  by mouth on day 1, then taper by 10mg  daily until  gone  21 tablet  0  . predniSONE (DELTASONE) 5 MG tablet Take 5 mg by mouth daily.       . ranitidine (ZANTAC) 150 MG tablet Take 1 tablet (150 mg total) by mouth daily.  30 tablet  6  . theophylline (THEO-24) 100 MG 24 hr capsule Take 100 mg by mouth 2 (two) times daily.       Marland Kitchen tiotropium (SPIRIVA) 18 MCG inhalation capsule Place 18 mcg into inhaler and inhale daily.      . traMADol (ULTRAM) 50 MG tablet Take 1 tablet (50 mg total) by mouth every 12 (twelve) hours as needed.  60 tablet  1  . albuterol (PROVENTIL) (2.5 MG/3ML) 0.083% nebulizer solution Take 3 mLs (2.5 mg total) by nebulization every 6 (six) hours as needed for wheezing.  150 mL  1   No current facility-administered medications on file prior to visit.     Review of Systems  Constitutional: Negative for fever and chills.  Respiratory: Positive for cough and shortness of breath (on oxygen 2/2 COPD).   Cardiovascular: Negative.   Gastrointestinal: Positive for nausea, abdominal pain (lower abdominal discomfort), diarrhea and blood in stool (hemorrhoid). Negative  for vomiting.  Musculoskeletal: Negative.   Neurological: Negative.   Psychiatric/Behavioral: Negative.   All other systems reviewed and are negative.      Objective:  BP 130/72  Pulse 92  Temp(Src) 98.3 F (36.8 C) (Oral)  Resp 16  Ht 5' 6.5" (1.689 m)  Wt 158 lb 4 oz (71.782 kg)  BMI 25.16 kg/m2  SpO2 91%   Physical Exam  Constitutional: He is oriented to person, place, and time.  Chronically ill appearing; NAD  HENT:  Head: Normocephalic and atraumatic.  Eyes: Conjunctivae and EOM are normal.  Neck: Neck supple.  Cardiovascular: Normal rate and regular rhythm.   Pulmonary/Chest: No respiratory distress.  Oxygen in use via n/c  Abdominal: Soft. Bowel sounds are normal. He exhibits no mass. There is tenderness (lower quadrants all across). There is no rebound and no guarding.  Large, soft, rounded belly  Musculoskeletal: Normal range of motion.    Neurological: He is alert and oriented to person, place, and time. Coordination normal.  Skin: Skin is warm and dry.  Psychiatric: He has a normal mood and affect. His behavior is normal. Judgment and thought content normal.      Assessment & Plan:   1. Diarrhea Check labs. I am sending him to have KUB done. Clear liquids for 24 hours then advance diet to easy to digest food. Check stool samples for c diff, ova/parasite and culture. Return in 1 week for follow up.  - CBC with Differential - Basic metabolic panel - Hepatic function panel - DG Abd 1 View; Future - Stool culture - Ova and parasite examination - Clostridium difficile EIA

## 2013-08-21 NOTE — Progress Notes (Signed)
Pre visit review using our clinic review tool, if applicable. No additional management support is needed unless otherwise documented below in the visit note. 

## 2013-08-21 NOTE — Patient Instructions (Signed)
  Please have labs drawn prior to leaving the office.  Go to the medical mall at the hospital to have an xray of your abdomen (KUB).  I will call you with results once they are available.  Clear liquids for the next 24 hours  Then start introducing foods that are easy to digest such as rice, toast, potato.

## 2013-08-22 ENCOUNTER — Ambulatory Visit: Payer: Self-pay | Admitting: Adult Health

## 2013-08-22 ENCOUNTER — Other Ambulatory Visit: Payer: Self-pay | Admitting: Adult Health

## 2013-08-22 DIAGNOSIS — R109 Unspecified abdominal pain: Secondary | ICD-10-CM | POA: Diagnosis not present

## 2013-08-22 DIAGNOSIS — R197 Diarrhea, unspecified: Secondary | ICD-10-CM | POA: Diagnosis not present

## 2013-08-22 LAB — CBC WITH DIFFERENTIAL/PLATELET
BASOS PCT: 0.6 % (ref 0.0–3.0)
Basophils Absolute: 0.1 10*3/uL (ref 0.0–0.1)
EOS PCT: 2.3 % (ref 0.0–5.0)
Eosinophils Absolute: 0.2 10*3/uL (ref 0.0–0.7)
HCT: 38.3 % — ABNORMAL LOW (ref 39.0–52.0)
Hemoglobin: 12.4 g/dL — ABNORMAL LOW (ref 13.0–17.0)
LYMPHS PCT: 19.3 % (ref 12.0–46.0)
Lymphs Abs: 2.1 10*3/uL (ref 0.7–4.0)
MCHC: 32.4 g/dL (ref 30.0–36.0)
MCV: 88.4 fl (ref 78.0–100.0)
MONO ABS: 1 10*3/uL (ref 0.1–1.0)
Monocytes Relative: 9.5 % (ref 3.0–12.0)
Neutro Abs: 7.3 10*3/uL (ref 1.4–7.7)
Neutrophils Relative %: 68.3 % (ref 43.0–77.0)
Platelets: 283 10*3/uL (ref 150.0–400.0)
RBC: 4.33 Mil/uL (ref 4.22–5.81)
RDW: 16.5 % — ABNORMAL HIGH (ref 11.5–15.5)
WBC: 10.6 10*3/uL — AB (ref 4.0–10.5)

## 2013-08-22 LAB — BASIC METABOLIC PANEL
BUN: 16 mg/dL (ref 6–23)
CHLORIDE: 104 meq/L (ref 96–112)
CO2: 29 meq/L (ref 19–32)
Calcium: 9.5 mg/dL (ref 8.4–10.5)
Creatinine, Ser: 1.1 mg/dL (ref 0.4–1.5)
GFR: 72.49 mL/min (ref >60.00)
Glucose, Bld: 82 mg/dL (ref 70–99)
Potassium: 4 mEq/L (ref 3.5–5.1)
SODIUM: 142 meq/L (ref 135–145)

## 2013-08-22 LAB — HEPATIC FUNCTION PANEL
ALBUMIN: 3.7 g/dL (ref 3.5–5.2)
ALK PHOS: 46 U/L (ref 39–117)
ALT: 14 U/L (ref 0–53)
AST: 24 U/L (ref 0–37)
Bilirubin, Direct: 0.1 mg/dL (ref 0.0–0.3)
TOTAL PROTEIN: 7.3 g/dL (ref 6.0–8.3)
Total Bilirubin: 0.5 mg/dL (ref 0.2–1.2)

## 2013-08-23 ENCOUNTER — Telehealth: Payer: Self-pay | Admitting: Adult Health

## 2013-08-23 LAB — C. DIFFICILE GDH AND TOXIN A/B
C. DIFFICILE GDH: NOT DETECTED
C. difficile Toxin A/B: NOT DETECTED

## 2013-08-23 LAB — OVA AND PARASITE EXAMINATION: OP: NONE SEEN

## 2013-08-23 NOTE — Telephone Encounter (Signed)
Abdominal xray was normal. If he is still having symptoms he needs to follow up

## 2013-08-26 LAB — STOOL CULTURE

## 2013-08-26 NOTE — Telephone Encounter (Signed)
Patient stated that he was not feeling any better. Follow up appt scheduled Friday 08/30/13 at 2pm

## 2013-08-28 DIAGNOSIS — IMO0002 Reserved for concepts with insufficient information to code with codable children: Secondary | ICD-10-CM | POA: Diagnosis not present

## 2013-08-28 DIAGNOSIS — S32000A Wedge compression fracture of unspecified lumbar vertebra, initial encounter for closed fracture: Secondary | ICD-10-CM | POA: Insufficient documentation

## 2013-08-28 DIAGNOSIS — M48061 Spinal stenosis, lumbar region without neurogenic claudication: Secondary | ICD-10-CM | POA: Diagnosis not present

## 2013-08-30 ENCOUNTER — Ambulatory Visit: Payer: Medicare Other | Admitting: Adult Health

## 2013-09-05 DIAGNOSIS — J9610 Chronic respiratory failure, unspecified whether with hypoxia or hypercapnia: Secondary | ICD-10-CM | POA: Diagnosis not present

## 2013-09-05 DIAGNOSIS — F17200 Nicotine dependence, unspecified, uncomplicated: Secondary | ICD-10-CM | POA: Diagnosis not present

## 2013-09-05 DIAGNOSIS — J449 Chronic obstructive pulmonary disease, unspecified: Secondary | ICD-10-CM | POA: Diagnosis not present

## 2013-09-15 ENCOUNTER — Other Ambulatory Visit: Payer: Self-pay | Admitting: Internal Medicine

## 2013-09-20 ENCOUNTER — Other Ambulatory Visit: Payer: Self-pay | Admitting: Internal Medicine

## 2013-09-20 NOTE — Telephone Encounter (Signed)
Ok to fill 

## 2013-09-23 ENCOUNTER — Encounter: Payer: Self-pay | Admitting: Adult Health

## 2013-10-07 ENCOUNTER — Telehealth: Payer: Self-pay | Admitting: Internal Medicine

## 2013-10-07 MED ORDER — IPRATROPIUM BROMIDE 0.02 % IN SOLN: 500.0000 ug | Freq: Two times a day (BID) | RESPIRATORY_TRACT | Status: DC | PRN

## 2013-10-07 MED ORDER — IPRATROPIUM-ALBUTEROL 0.5-2.5 (3) MG/3ML IN SOLN: 3.0000 mL | RESPIRATORY_TRACT | Status: DC | PRN

## 2013-10-07 NOTE — Telephone Encounter (Signed)
ipratropium (ATROVENT) 0.02 % nebulizer solution  ipratropium-albuterol (DUONEB) 0.5-2.5 (3) MG/3ML SOLN

## 2013-10-08 ENCOUNTER — Telehealth: Payer: Self-pay | Admitting: *Deleted

## 2013-10-08 MED ORDER — IPRATROPIUM-ALBUTEROL 0.5-2.5 (3) MG/3ML IN SOLN: 3.0000 mL | RESPIRATORY_TRACT | Status: DC | PRN

## 2013-10-08 MED ORDER — IPRATROPIUM BROMIDE 0.02 % IN SOLN: 500.0000 ug | Freq: Two times a day (BID) | RESPIRATORY_TRACT | Status: DC | PRN

## 2013-10-08 NOTE — Telephone Encounter (Signed)
[facility] sent fax, needing diagnosis code for Duoneb and atrovent. Rx sent to pharmacy by escript

## 2013-11-13 ENCOUNTER — Other Ambulatory Visit: Payer: Self-pay | Admitting: Internal Medicine

## 2013-11-13 NOTE — Telephone Encounter (Signed)
Ok refill? 

## 2013-11-14 DIAGNOSIS — M810 Age-related osteoporosis without current pathological fracture: Secondary | ICD-10-CM | POA: Diagnosis not present

## 2013-11-14 DIAGNOSIS — M48061 Spinal stenosis, lumbar region without neurogenic claudication: Secondary | ICD-10-CM | POA: Diagnosis not present

## 2013-11-14 DIAGNOSIS — M069 Rheumatoid arthritis, unspecified: Secondary | ICD-10-CM | POA: Diagnosis not present

## 2013-11-14 DIAGNOSIS — J8410 Pulmonary fibrosis, unspecified: Secondary | ICD-10-CM | POA: Diagnosis not present

## 2013-11-18 ENCOUNTER — Other Ambulatory Visit: Payer: Self-pay | Admitting: Internal Medicine

## 2013-11-18 NOTE — Telephone Encounter (Signed)
Okay to refill? Last seen in May 2015

## 2013-11-18 NOTE — Telephone Encounter (Signed)
Rx faxed to pharmacy  

## 2013-11-21 DIAGNOSIS — H4010X0 Unspecified open-angle glaucoma, stage unspecified: Secondary | ICD-10-CM | POA: Diagnosis not present

## 2013-12-04 DIAGNOSIS — J440 Chronic obstructive pulmonary disease with acute lower respiratory infection: Secondary | ICD-10-CM | POA: Diagnosis not present

## 2013-12-04 DIAGNOSIS — R059 Cough, unspecified: Secondary | ICD-10-CM | POA: Diagnosis not present

## 2013-12-04 DIAGNOSIS — J438 Other emphysema: Secondary | ICD-10-CM | POA: Diagnosis not present

## 2013-12-04 DIAGNOSIS — R05 Cough: Secondary | ICD-10-CM | POA: Diagnosis not present

## 2013-12-04 DIAGNOSIS — F17200 Nicotine dependence, unspecified, uncomplicated: Secondary | ICD-10-CM | POA: Diagnosis not present

## 2013-12-06 ENCOUNTER — Ambulatory Visit: Payer: Self-pay | Admitting: Internal Medicine

## 2013-12-06 ENCOUNTER — Ambulatory Visit (INDEPENDENT_AMBULATORY_CARE_PROVIDER_SITE_OTHER): Payer: Medicare Other | Admitting: Internal Medicine

## 2013-12-06 ENCOUNTER — Encounter: Payer: Self-pay | Admitting: Internal Medicine

## 2013-12-06 VITALS — BP 118/72 | HR 82 | Temp 98.1°F | Ht 66.8 in | Wt 158.5 lb

## 2013-12-06 DIAGNOSIS — R197 Diarrhea, unspecified: Secondary | ICD-10-CM

## 2013-12-06 DIAGNOSIS — J45901 Unspecified asthma with (acute) exacerbation: Secondary | ICD-10-CM | POA: Diagnosis not present

## 2013-12-06 DIAGNOSIS — R0902 Hypoxemia: Secondary | ICD-10-CM | POA: Diagnosis not present

## 2013-12-06 DIAGNOSIS — J441 Chronic obstructive pulmonary disease with (acute) exacerbation: Secondary | ICD-10-CM

## 2013-12-06 DIAGNOSIS — R059 Cough, unspecified: Secondary | ICD-10-CM | POA: Diagnosis not present

## 2013-12-06 DIAGNOSIS — J449 Chronic obstructive pulmonary disease, unspecified: Secondary | ICD-10-CM | POA: Diagnosis not present

## 2013-12-06 MED ORDER — PREDNISONE 10 MG PO TABS: ORAL_TABLET | ORAL | Status: DC

## 2013-12-06 NOTE — Progress Notes (Signed)
Review of Systems     Objective:    There were no vitals taken for this visit. Physical Exam        Assessment & Plan:   Problem List Items Addressed This Visit   None       No Follow-up on file.

## 2013-12-06 NOTE — Assessment & Plan Note (Signed)
Symptoms consistent with COPD exacerbation. Will start prednisone taper. His pulmonologist has started Azithromycin. Continue supplemental oxygen and inhaled albuterol. We have encouraged him to quit smoking, but he is not interested. His prognosis is poor with ongoing tobacco use.

## 2013-12-06 NOTE — Assessment & Plan Note (Signed)
Several months of intermittent watery diarrhea. Reviewed labs and stool studies all of which were normal in 07/2013. Given his compromised respiratory state, we discussed that he is a poor candidate for repeat colonoscopy, but this would likely be the next step in his evaluation. Will set up GI referral.

## 2013-12-06 NOTE — Progress Notes (Signed)
Subjective:    Patient ID: Peter Johnston, male    DOB: 16-Feb-1941, 73 y.o.   MRN: [id number]  HPI 73YO male presents for follow up.  Feeling more short of breath recently. Started on Azithromycin by Dr. Humphrey Rolls yesterday. Cough productive of mostly clear mucous. No fever, chills, chest pain.  Also concerned about abdominal cramping and diarrhea. Ongoing since 07/2013. Had stool studies, labwork which was normal. Has severe pain in the mornings after eating. No blood. No black stool. Last colonoscopy was normal per pt about 5 years ago.  Also recently diagnosed with ruptured disk in back. Was told that he would need to be referred to [facility] for further evaluation. Currently not bothering him so has held off on referral.  Review of Systems  Constitutional: Negative for fever, chills, activity change, appetite change, fatigue and unexpected weight change.  Eyes: Negative for visual disturbance.  Respiratory: Positive for cough, shortness of breath and wheezing.   Cardiovascular: Negative for chest pain, palpitations and leg swelling.  Gastrointestinal: Positive for diarrhea. Negative for nausea, vomiting, abdominal pain, constipation, blood in stool, abdominal distention and rectal pain.  Genitourinary: Negative for dysuria, urgency and difficulty urinating.  Musculoskeletal: Negative for arthralgias and gait problem.  Skin: Negative for color change and rash.  Hematological: Negative for adenopathy.  Psychiatric/Behavioral: Negative for sleep disturbance and dysphoric mood. The patient is not nervous/anxious.        Objective:    BP 118/72  Pulse 82  Temp(Src) 98.1 F (36.7 C) (Oral)  Ht 5' 6.8" (1.697 m)  Wt 158 lb 8 oz (71.895 kg)  BMI 24.97 kg/m2  SpO2 89% repeat sat 93% on 3L Physical Exam  Constitutional: He is oriented to person, place, and time. He appears well-developed and well-nourished. No distress.  HENT:  Head: Normocephalic and atraumatic.  Right Ear: External ear  normal.  Left Ear: External ear normal.  Nose: Nose normal.  Mouth/Throat: Oropharynx is clear and moist. No oropharyngeal exudate.  Eyes: Conjunctivae and EOM are normal. Pupils are equal, round, and reactive to light. Right eye exhibits no discharge. Left eye exhibits no discharge. No scleral icterus.  Neck: Normal range of motion. Neck supple. No tracheal deviation present. No thyromegaly present.  Cardiovascular: Normal rate, regular rhythm and normal heart sounds.  Exam reveals no gallop and no friction rub.   No murmur heard. Pulmonary/Chest: Accessory muscle usage present. Tachypnea noted. No respiratory distress. He has decreased breath sounds. He has wheezes. He has no rhonchi. He has no rales. He exhibits no tenderness.  Musculoskeletal: Normal range of motion. He exhibits no edema.  Lymphadenopathy:    He has no cervical adenopathy.  Neurological: He is alert and oriented to person, place, and time. No cranial nerve deficit. Coordination normal.  Skin: Skin is warm and dry. No rash noted. He is not diaphoretic. No erythema. No pallor.  Psychiatric: He has a normal mood and affect. His behavior is normal. Judgment and thought content normal.          Assessment & Plan:   Problem List Items Addressed This Visit     Unprioritized   COPD exacerbation - Primary     Symptoms consistent with COPD exacerbation. Will start prednisone taper. His pulmonologist has started Azithromycin. Continue supplemental oxygen and inhaled albuterol. We have encouraged him to quit smoking, but he is not interested. His prognosis is poor with ongoing tobacco use.    Relevant Medications      predniSONE (DELTASONE) tablet  Diarrhea     Several months of intermittent watery diarrhea. Reviewed labs and stool studies all of which were normal in 07/2013. Given his compromised respiratory state, we discussed that he is a poor candidate for repeat colonoscopy, but this would likely be the next step in his  evaluation. Will set up GI referral.    Relevant Orders      Ambulatory referral to Gastroenterology       Return in about 2 weeks (around 12/20/2013) for Recheck.

## 2013-12-06 NOTE — Progress Notes (Signed)
Pre visit review using our clinic review tool, if applicable. No additional management support is needed unless otherwise documented below in the visit note. 

## 2013-12-06 NOTE — Patient Instructions (Signed)
Please start Prednisone taper.  Chest xray today at [facility]  Follow up in 2 weeks or sooner as needed.

## 2013-12-09 ENCOUNTER — Telehealth: Payer: Self-pay | Admitting: Internal Medicine

## 2013-12-09 NOTE — Telephone Encounter (Signed)
Left vm requesting pt to return my call 

## 2013-12-09 NOTE — Telephone Encounter (Signed)
CXR showed marked hyperexpansion of the lungs, but no acute process. This is consistent with known h/o severe COPD.

## 2013-12-13 NOTE — Telephone Encounter (Signed)
Notified pt. 

## 2014-01-13 ENCOUNTER — Encounter: Payer: Self-pay | Admitting: Internal Medicine

## 2014-01-16 ENCOUNTER — Other Ambulatory Visit: Payer: Self-pay | Admitting: Internal Medicine

## 2014-01-16 DIAGNOSIS — Z23 Encounter for immunization: Secondary | ICD-10-CM | POA: Diagnosis not present

## 2014-02-06 ENCOUNTER — Other Ambulatory Visit: Payer: Self-pay | Admitting: *Deleted

## 2014-02-06 MED ORDER — RANITIDINE HCL 150 MG PO TABS: 150.0000 mg | ORAL_TABLET | Freq: Every day | ORAL | Status: DC

## 2014-02-10 ENCOUNTER — Other Ambulatory Visit: Payer: Self-pay | Admitting: *Deleted

## 2014-02-10 MED ORDER — TRAMADOL HCL 50 MG PO TABS: ORAL_TABLET | ORAL | Status: DC

## 2014-02-10 NOTE — Telephone Encounter (Signed)
Faxed to pharmacy

## 2014-02-10 NOTE — Telephone Encounter (Signed)
Ok refill? Last visit 12/06/13

## 2014-02-20 ENCOUNTER — Other Ambulatory Visit: Payer: Self-pay | Admitting: Internal Medicine

## 2014-03-10 DIAGNOSIS — F17210 Nicotine dependence, cigarettes, uncomplicated: Secondary | ICD-10-CM | POA: Diagnosis not present

## 2014-03-10 DIAGNOSIS — J441 Chronic obstructive pulmonary disease with (acute) exacerbation: Secondary | ICD-10-CM | POA: Diagnosis not present

## 2014-03-10 DIAGNOSIS — J9611 Chronic respiratory failure with hypoxia: Secondary | ICD-10-CM | POA: Diagnosis not present

## 2014-03-18 ENCOUNTER — Telehealth: Payer: Self-pay

## 2014-03-18 NOTE — Telephone Encounter (Signed)
Chart updated

## 2014-03-18 NOTE — Telephone Encounter (Signed)
Peter Johnston called and has received his flu shot at [facility] last month.

## 2014-04-16 ENCOUNTER — Other Ambulatory Visit: Payer: Self-pay | Admitting: Internal Medicine

## 2014-05-22 DIAGNOSIS — H4010X2 Unspecified open-angle glaucoma, moderate stage: Secondary | ICD-10-CM | POA: Diagnosis not present

## 2014-05-29 ENCOUNTER — Other Ambulatory Visit: Payer: Self-pay | Admitting: Internal Medicine

## 2014-05-30 ENCOUNTER — Other Ambulatory Visit: Payer: Self-pay | Admitting: *Deleted

## 2014-05-30 MED ORDER — IPRATROPIUM-ALBUTEROL 0.5-2.5 (3) MG/3ML IN SOLN: RESPIRATORY_TRACT | Status: DC

## 2014-06-19 ENCOUNTER — Ambulatory Visit (INDEPENDENT_AMBULATORY_CARE_PROVIDER_SITE_OTHER): Payer: Medicare Other | Admitting: Internal Medicine

## 2014-06-19 ENCOUNTER — Encounter: Payer: Self-pay | Admitting: Internal Medicine

## 2014-06-19 VITALS — BP 120/71 | HR 73 | Temp 97.4°F | Ht 66.5 in | Wt 158.0 lb

## 2014-06-19 DIAGNOSIS — Z720 Tobacco use: Secondary | ICD-10-CM

## 2014-06-19 DIAGNOSIS — Z23 Encounter for immunization: Secondary | ICD-10-CM

## 2014-06-19 DIAGNOSIS — Z0000 Encounter for general adult medical examination without abnormal findings: Secondary | ICD-10-CM

## 2014-06-19 DIAGNOSIS — J449 Chronic obstructive pulmonary disease, unspecified: Secondary | ICD-10-CM | POA: Diagnosis not present

## 2014-06-19 DIAGNOSIS — I10 Essential (primary) hypertension: Secondary | ICD-10-CM | POA: Diagnosis not present

## 2014-06-19 LAB — CBC WITH DIFFERENTIAL/PLATELET
Basophils Absolute: 0.1 10*3/uL (ref 0.0–0.1)
Basophils Relative: 0.4 % (ref 0.0–3.0)
EOS PCT: 2.3 % (ref 0.0–5.0)
Eosinophils Absolute: 0.3 10*3/uL (ref 0.0–0.7)
HCT: 39.4 % (ref 39.0–52.0)
Hemoglobin: 13.1 g/dL (ref 13.0–17.0)
LYMPHS PCT: 16.9 % (ref 12.0–46.0)
Lymphs Abs: 2 10*3/uL (ref 0.7–4.0)
MCHC: 33.1 g/dL (ref 30.0–36.0)
MCV: 86.6 fl (ref 78.0–100.0)
Monocytes Absolute: 0.9 10*3/uL (ref 0.1–1.0)
Monocytes Relative: 7.6 % (ref 3.0–12.0)
Neutro Abs: 8.7 10*3/uL — ABNORMAL HIGH (ref 1.4–7.7)
Neutrophils Relative %: 72.8 % (ref 43.0–77.0)
Platelets: 285 10*3/uL (ref 150.0–400.0)
RBC: 4.55 Mil/uL (ref 4.22–5.81)
RDW: 16.4 % — ABNORMAL HIGH (ref 11.5–15.5)
WBC: 12 10*3/uL — AB (ref 4.0–10.5)

## 2014-06-19 LAB — VITAMIN D 25 HYDROXY (VIT D DEFICIENCY, FRACTURES): VITD: 20.68 ng/mL — AB (ref 30.00–100.00)

## 2014-06-19 LAB — POCT URINALYSIS DIPSTICK
Blood, UA: NEGATIVE
Glucose, UA: NEGATIVE
LEUKOCYTES UA: NEGATIVE
Nitrite, UA: NEGATIVE
PROTEIN UA: NEGATIVE
Spec Grav, UA: >=1.03
Urobilinogen, UA: 0.2
pH, UA: 5.5

## 2014-06-19 LAB — LIPID PANEL
Cholesterol: 168 mg/dL (ref 0–200)
HDL: 69.5 mg/dL (ref >39.00)
LDL CALC: 70 mg/dL (ref 0–99)
NONHDL: 98.5
Total CHOL/HDL Ratio: 2
Triglycerides: 143 mg/dL (ref 0.0–149.0)
VLDL: 28.6 mg/dL (ref 0.0–40.0)

## 2014-06-19 LAB — MICROALBUMIN / CREATININE URINE RATIO
CREATININE, U: 219.6 mg/dL
MICROALB UR: 2.3 mg/dL — AB (ref 0.0–1.9)
MICROALB/CREAT RATIO: 1 mg/g (ref 0.0–30.0)

## 2014-06-19 LAB — COMPREHENSIVE METABOLIC PANEL
ALBUMIN: 4 g/dL (ref 3.5–5.2)
ALT: 13 U/L (ref 0–53)
AST: 21 U/L (ref 0–37)
Alkaline Phosphatase: 40 U/L (ref 39–117)
BUN: 19 mg/dL (ref 6–23)
CALCIUM: 9.6 mg/dL (ref 8.4–10.5)
CHLORIDE: 102 meq/L (ref 96–112)
CO2: 31 mEq/L (ref 19–32)
Creatinine, Ser: 1.12 mg/dL (ref 0.40–1.50)
GFR: 67.88 mL/min (ref >60.00)
GLUCOSE: 98 mg/dL (ref 70–99)
POTASSIUM: 4.4 meq/L (ref 3.5–5.1)
Sodium: 138 mEq/L (ref 135–145)
Total Bilirubin: 0.4 mg/dL (ref 0.2–1.2)
Total Protein: 7.4 g/dL (ref 6.0–8.3)

## 2014-06-19 LAB — PSA, MEDICARE: PSA: 0.75 ng/ml (ref 0.10–4.00)

## 2014-06-19 NOTE — Patient Instructions (Signed)

## 2014-06-19 NOTE — Progress Notes (Signed)
Pre visit review using our clinic review tool, if applicable. No additional management support is needed unless otherwise documented below in the visit note. 

## 2014-06-19 NOTE — Progress Notes (Signed)
The patient is here for annual Medicare Wellness Examination and management of other chronic and acute problems.   The risk factors are reflected in the history.  The roster of all physicians providing medical care to patient - is listed in the Snapshot section of the chart.  Activities of daily living:   The patient is 100% independent in all ADLs: dressing, toileting, feeding as well as independent mobility. Patient lives with his girlfriend and son in a 1 story home.  Smokes about 1.5PPD.  Home safety :  The patient has smoke and CO2 detectors in the home.  They wear seatbelts in their car. There are no firearms at home.  There is no violence in the home. They feel safe where they live.  Infectious Risks: There is no risks for hepatitis, STDs or HIV.  There is no  history of blood transfusion.  They have no travel history to infectious disease endemic areas of the world.  Additional Health Care Providers: The patient has not seen their dentist in the last six months. Wears dentures. Has chronci bone loss. Dentist - Dr. Sharlett Iles They have seen their eye doctor in the last year. Opthalmologist - Dr. Linton Flemings They deny hearing issues. They have deferred audiologic testing in the last year.   They do not  have excessive sun exposure. Discussed the need for sun protection: hats,long sleeves and use of sunscreen if there is significant sun exposure.  Dermatologist - Dr. Koleen Nimrod Rheumatologist - Dr. Jefm Bryant Pulmonary - Dr. Chancy Milroy  Diet: the importance of a healthy diet is discussed. They do have a healthy diet.  The benefits of regular aerobic exercise were discussed. Unable to exercise because of dyspnea.  Depression screen: there are no signs or vegative symptoms of depression- irritability, change in appetite, anhedonia, sadness/tearfullness.  Cognitive assessment: the patient manages all their financial and personal affairs and is actively engaged. They could relate  day,date,year and events.  HCPOA - son, Ryler Laskowski Living Will - yes  The following portions of the patient's history were reviewed and updated as appropriate: allergies, current medications, past family history, past medical history,  past surgical history, past social history and problem list.  Visual acuity was not assessed per patient preference as they have regular follow up with their ophthalmologist. Hearing and body mass index were assessed and reviewed.   During the course of the visit the patient was educated and counseled about appropriate screening and preventive services including : fall prevention , diabetes screening, nutrition counseling, colorectal cancer screening, and recommended immunizations.    Review of Systems  Constitutional: Negative for fever, chills, activity change, appetite change, fatigue and unexpected weight change.  Eyes: Negative for visual disturbance.  Respiratory: Positive for cough and shortness of breath.   Cardiovascular: Negative for chest pain, palpitations and leg swelling.  Gastrointestinal: Negative for abdominal pain, diarrhea, constipation and abdominal distention.  Genitourinary: Negative for dysuria, urgency and difficulty urinating.  Musculoskeletal: Positive for myalgias and arthralgias. Negative for gait problem.  Skin: Negative for color change and rash.  Hematological: Negative for adenopathy.  Psychiatric/Behavioral: Negative for sleep disturbance and dysphoric mood. The patient is not nervous/anxious.        Objective:    BP 120/71 mmHg  Pulse 73  Temp(Src) 97.4 F (36.3 C) (Oral)  Ht 5' 6.5" (1.689 m)  Wt 158 lb (71.668 kg)  BMI 25.12 kg/m2  SpO2 91% Physical Exam  Constitutional: He is oriented to person, place, and time. He appears well-developed and  well-nourished. No distress.  HENT:  Head: Normocephalic and atraumatic.  Right Ear: External ear normal.  Left Ear: External ear normal.  Nose: Nose normal.   Mouth/Throat: Oropharynx is clear and moist. No oropharyngeal exudate.  Eyes: Conjunctivae and EOM are normal. Pupils are equal, round, and reactive to light. Right eye exhibits no discharge. Left eye exhibits no discharge. No scleral icterus.  Neck: Normal range of motion. Neck supple. No tracheal deviation present. No thyromegaly present.  Cardiovascular: Normal rate, regular rhythm and normal heart sounds.  Exam reveals no gallop and no friction rub.   No murmur heard. Pulmonary/Chest: Accessory muscle usage ( ) present. Tachypnea noted. No respiratory distress. He has decreased breath sounds. He has wheezes. He has rhonchi. He has no rales. He exhibits no tenderness.  Very poor air movement which is chronic on his exam.   Musculoskeletal: Normal range of motion. He exhibits no edema.  Lymphadenopathy:    He has no cervical adenopathy.  Neurological: He is alert and oriented to person, place, and time. No cranial nerve deficit. Coordination normal.  Skin: Skin is warm and dry. No rash noted. He is not diaphoretic. No erythema. No pallor.  Psychiatric: He has a normal mood and affect. His behavior is normal. Judgment and thought content normal.          Assessment & Plan:   Problem List Items Addressed This Visit      Unprioritized   COPD, severe    Severe COPD. Followed by Dr. Humphrey Rolls. Strongly encouraged smoking cessation. Will continue current medications.      Medicare annual wellness visit, subsequent - Primary    General medical exam normal today except as noted. Quality of life and life expectancy limited by severe COPD, however he continues to smoke >1.5PPD. Strongly encouraged smoking cessation. Will set up screening CT for lung cancer. Discussed HCPOA and living will and asked him to bring a copy of his living will with him to clinic. Will check labs today including CBC, CMP, Lipids, PSA. Prevnar given today. Follow up in 6 months and prn.      Relevant Orders   POCT  urinalysis dipstick   CBC with Differential/Platelet   Comprehensive metabolic panel   Lipid panel   Vit D  25 hydroxy (rtn osteoporosis monitoring)   Microalbumin / creatinine urine ratio   Ambulatory referral to Dermatology   PSA, Medicare   Tobacco abuse    Strongly encouraged smoking cessation. Will set up CT chest for screening for lung cancer.      Relevant Orders   CT CHEST LUNG CA SCREEN LOW DOSE W/O CM       Return in about 6 months (around 12/20/2014) for Recheck.

## 2014-06-19 NOTE — Assessment & Plan Note (Signed)
Strongly encouraged smoking cessation. Will set up CT chest for screening for lung cancer.

## 2014-06-19 NOTE — Assessment & Plan Note (Signed)
Severe COPD. Followed by Dr. Humphrey Rolls. Strongly encouraged smoking cessation. Will continue current medications.

## 2014-06-19 NOTE — Assessment & Plan Note (Signed)
General medical exam normal today except as noted. Quality of life and life expectancy limited by severe COPD, however he continues to smoke >1.5PPD. Strongly encouraged smoking cessation. Will set up screening CT for lung cancer. Discussed HCPOA and living will and asked him to bring a copy of his living will with him to clinic. Will check labs today including CBC, CMP, Lipids, PSA. Prevnar given today. Follow up in 6 months and prn.

## 2014-06-19 NOTE — Addendum Note (Signed)
Addended by: Nanci Pina on: 06/19/2014 12:56 PM   Modules accepted: Orders

## 2014-07-08 ENCOUNTER — Other Ambulatory Visit: Payer: Self-pay | Admitting: Internal Medicine

## 2014-07-08 DIAGNOSIS — N400 Enlarged prostate without lower urinary tract symptoms: Secondary | ICD-10-CM

## 2014-07-08 MED ORDER — TAMSULOSIN HCL 0.4 MG PO CAPS: 0.4000 mg | ORAL_CAPSULE | Freq: Every day | ORAL | Status: DC

## 2014-07-14 ENCOUNTER — Telehealth: Payer: Self-pay | Admitting: *Deleted

## 2014-07-14 NOTE — Telephone Encounter (Signed)
Pt called, stating he took Flomax x 2 days, complained of SOB and dizziness while on it. Did not check his VS while having the symptoms. Stopped it 2 days ago, no symptoms since. Wants to know if he needs to keep his one month follow up appt that was to follow up on taking it.

## 2014-07-14 NOTE — Telephone Encounter (Signed)
He should stop medication and we should add to allergy list. If he continues to have issues with enlarged prostate and decreased urine flow, we can set up urology evaluation. He is welcome to cancel follow up visit here for BPH.

## 2014-07-15 NOTE — Discharge Summary (Signed)
PATIENT NAME:  Peter Johnston, Peter Johnston MR#:  [id number] DATE OF BIRTH:  [birth year]  DATE OF ADMISSION:  01/30/2012 DATE OF DISCHARGE:  02/05/2012  PRIMARY CARE PHYSICIAN: Ronette Deter, MD  DISCHARGE DIAGNOSES:  1. Acute on chronic respiratory failure.  2. Chronic obstructive pulmonary disease exacerbation. 3. Pneumonia. 4. Leukocytosis. 5. Dehydration. 6. Constipation. 7. Hypokalemia. 8. Anemia. 9. Tobacco abuse.  10. Hyperlipidemia.  11. Peptic ulcer disease.  12. Gout.  13. Arthritis.   HOME MEDICATIONS: Please refer to the physician's discharge instructions.   HOME OXYGEN: The patient needs home oxygen, 3 liters by nasal cannula.   DIET: Low sodium, low fat, low cholesterol, mechanical soft diet.   ACTIVITY: As tolerated.   FOLLOW-UP CARE: Followup with Dr. Gilford Rile within 2 to 3 days. Also, the patient needs outpatient physical therapy, pulmonary rehab, and outpatient swallowing study.   REASON FOR ADMISSION: Worsening cough, wheezing, and increased shortness of breath.   HOSPITAL COURSE: The patient is a 74 year old Caucasian male with a history of chronic obstructive pulmonary disease, chronic respiratory failure on home oxygen, and tobacco abuse who presented to the ED with worsening cough, wheezing, and shortness of breath. The patient was brought to the hospital for further evaluation and was noted to have bilateral lower lobe pneumonia and was placed on BiPAP. For detailed history and physical and examination, please refer to the admission note dictated by Dr. Wilfred Curtis. On admission date, the patient's WBC was 19,000 and hemoglobin 12. ABG showed pH of 7.36, pCO2 50, and pO2 80. Chest x-ray showed bilateral lower lobe infiltrate. BNP was 1481.  BUN was 23 and creatinine 0.9. The patient was admitted for pneumonia, acute on chronic respiratory failure, and chronic obstructive pulmonary disease exacerbation. After admission the patient has been treated with DuoNebs, Levaquin,  vancomycin, and Solu-Medrol. After above-mentioned treatment, the patient's symptoms have much improved, but still has mild shortness of breath and cough, on 3 liters oxygen. The patient's WBC decreased to 14. For dehydration the patient has been treated with IV fluid support, which has resolved. The patient is clinically stable and will be discharged to home today. I discussed the patient's discharge plan with the patient and the case manager.   TIME SPENT: About 20.  ____________________________ Demetrios Loll, MD qc:slb D: 02/05/2012 13:57:37 ET T: 02/06/2012 11:50:59 ET JOB#: [id number]  cc: Demetrios Loll, MD, <Dictator> Eduard Clos. Gilford Rile, MD Demetrios Loll MD ELECTRONICALLY SIGNED 02/07/2012 16:36

## 2014-07-15 NOTE — Telephone Encounter (Signed)
Pt notified and  verbalized understanding. Appt cancelled.

## 2014-07-15 NOTE — H&P (Signed)
PATIENT NAME:  Peter Johnston, Peter Johnston MR#:  [id number] DATE OF BIRTH:  July 16, 1940  DATE OF ADMISSION:  01/30/2012  PRIMARY CARE PHYSICIAN:  Dr. Ronette Deter  REFERRING PHYSICIAN: Dr. Graciella Freer  CHIEF COMPLAINT: Worsening cough, wheezing, and increased shortness of breath.   HISTORY OF PRESENT ILLNESS: Mr. Peter Johnston is a 74 year old Caucasian male with history of chronic obstructive pulmonary disease, history of chronic respiratory failure, home oxygen dependent, and history of tobacco abuse. The patient reports that over the last four days he developed congestion, progressive increase in cough with yellow sputum associated with worsening shortness of breath and wheezing. His symptoms worsened over the last 24 hours to the extent that he said he had a very hard time breathing. He also reported fever, but he did not check his temperature. The patient was brought to the hospital for evaluation and evaluation here showed bilateral lower lobe pneumonia more so on the right than the left. Also, chronic obstructive pulmonary disease exacerbation and respiratory failure. He received several breathing treatments and then he was placed on BiPAP. Right now he is feeling that he started to improve. The patient was admitted for further evaluation and treatment.     REVIEW OF SYSTEMS: CONSTITUTIONAL: The patient reports fever, no chills. He reports some fatigue. EYES: No blurring of vision. No double vision. ENT: No hearing impairment. No sore throat. No dysphagia. CARDIOVASCULAR: No chest pain but he has shortness of breath and wheezing. No syncope. No palpitations. RESPIRATORY: Reports wheezing, shortness of breath, cough, and sputum production. No hemoptysis. GASTROINTESTINAL: No abdominal pain, no vomiting, no diarrhea. GENITOURINARY: No dysuria. No frequency of urination. MUSCULOSKELETAL: No joint pain or swelling. No muscular pain or swelling. INTEGUMENT: No skin rash. No ulcers. NEURO: No focal weakness. No seizure  activity. No headache. PSYCH: No anxiety or depression. ENDOCRINE: No polyuria or polydipsia. No heat or cold intolerance.   PAST MEDICAL HISTORY:  1. Chronic obstructive pulmonary disease with severe emphysema.  2. Chronic respiratory failure, home oxygen dependent, 3 liters at rest and 4 liters with exertion.  3. Tobacco abuse.  4. Hypercholesterolemia.  5. History of MRSA infection of the lower extremity after hammertoe surgery.  6. History of Guillain-Barre syndrome in 1986.  7. Alcohol abuse.  8. Peptic ulcer disease.  9. Gout.  10. Arthritis.  11. Iron deficiency anemia followed by Dr. Grayland Ormond.   12. His last colonoscopy was in 2011.  PAST SURGICAL HISTORY:  1. Tonsillectomy. 2. Umbilical hernia repair.  3. Surgery for hammertoe.  SOCIAL HABITS: Chronic smoker, 1 to 2 packs a day for more than 50 years. He quit in January of this year. He started smoking again a month ago. Currently he is smoking 2 packs a day. He drinks two beers a night.   SOCIAL HISTORY: He is widowed. Lives at home with his son. He is retired.    FAMILY HISTORY: Positive for coronary artery disease.   ADMISSION MEDICATIONS:  1. Advair Diskus 250/50, 1 inhalation once a day.  2. Albuterol 0.083% q. 6 hours p.r.n.  3. Ventolin HFA inhaler.  4. Allopurinol 300 mg once a day.  5. Atenolol 25 mg a day, taking 1/2 tablet a day.  6. Fenofibrate 145 mg a day.  7. Hydroxychloroquine 200 mg once a day.  8. Omeprazole 40 mg a day. 9. Travatan 0.004%, one drop in each eye once a day at night.   ALLERGIES: Sulfa causes rash. Augmentin causes GI problems/side effects. Vioxx causes skin rash. Also, he has  some side effects from gold-containing compounds.   PHYSICAL EXAMINATION:  VITAL SIGNS: His blood pressure is 99/61, respiratory rate 24, pulse 96, temperature 102, oxygen saturation 97% while on BiPAP and oxygen supply.   GENERAL APPEARANCE: Elderly male lying in bed in no acute distress with the BiPAP face  mask on.   HEAD: No pallor. No icterus. No cyanosis.   ENT: Hearing was normal. Nasal mucosa, lips, tongue were normal. He is edentulous, has dentures for the upper jaw.   EYES: Normal eyelids and conjunctivae. Pupils about 4 to 5 mm, equal, and reactive to light.   NECK: Supple. Trachea at midline. No cervical lymphadenopathy. No masses.   HEART: Distant heart sounds. Difficult to auscultate secondary to his emphysema. I could not hear heart sounds clearly due to his emphysema and also the noise from the BiPAP machine. No murmur was appreciated. No carotid bruits.   RESPIRATORY: Slight tachypnea. He is using accessory muscles just mildly. Bilateral wheezing and slight prolongation of the expiratory phase. No rales.   ABDOMEN: Soft without tenderness. No hepatosplenomegaly. No masses. No hernias.   SKIN: No ulcers. No subcutaneous nodules.   MUSCULOSKELETAL: No joint swelling. No clubbing.   NEURO: Cranial nerves II through XII are intact. No focal motor deficit.   PSYCHIATRY: The patient is alert and oriented times three. Mood and affect were normal.   LABORATORY, DIAGNOSTIC, AND RADIOLOGICAL DATA: EKG showed sinus tachycardia at a rate of 125 per minute. Right bundle branch block. Left axis deviation secondary to left anterior hemiblock. Otherwise unremarkable EKG. Chest x-ray showed bilateral lower lobe infiltrates, more so on the right than the left. Serum glucose 193. B-type natriuretic peptide was 1481, BUN 23, creatinine 0.9, sodium 138, potassium 3.4. Normal liver function tests except for low albumin at 2.8. Troponin 0.02. CBC showed white count 19,000, hemoglobin 12, hematocrit 35, platelet count 278. ABG showed a pH of 7.36, pCO2 50, pO2 80. This is on BiPAP. PEEP of 5. FiO2 of 80%.    ASSESSMENT:  1. Pneumonia.  2. Acute on chronic respiratory failure, home oxygen dependent.  3. Acute exacerbation of chronic obstructive pulmonary disease.  4. Tobacco and alcohol abuse.   5. Hypokalemia.  6. History of rheumatoid arthritis and gout.  7. History of iron deficiency anemia.   PLAN:  1. We will admit the patient to the hospital with intensive treatment with bronchodilator therapy using DuoNebs along with IV antibiotic using Levaquin. I will add vancomycin since he has previous of history of MRSA infection. IV Solu-Medrol. Blood cultures times two and sputum culture were ordered.  2. Continue home medications as listed above.  3. Potassium supplementation to correct the hypokalemia.  4. Continue BiPAP treatment until stabilization and then will shift to nasal cannula.  5. The patient indicates that he has a LIVING WILL but nothing specific about the medical issues. He remains FULL CODE. He had given the power of attorney to his son.   TIME SPENT:  Time Spent evaluating this patient and reviewing medical records took more than one hour.   ____________________________ Clovis Pu. Lenore Manner, MD amd:bjt D: 01/30/2012 23:32:04 ET T: 01/31/2012 07:34:37 ET JOB#: [id number]  cc: Clovis Pu. Lenore Manner, MD, <Dictator> Eduard Clos. Gilford Rile, MD Clovis Pu Yeager MD ELECTRONICALLY SIGNED 02/01/2012 22:44

## 2014-07-17 ENCOUNTER — Ambulatory Visit
Admit: 2014-07-17 | Disposition: A | Discharge status: Home / Self Care | Payer: Self-pay | Attending: Family Medicine | Admitting: Family Medicine

## 2014-07-18 ENCOUNTER — Ambulatory Visit
Admit: 2014-07-18 | Disposition: A | Discharge status: Home / Self Care | Payer: Self-pay | Attending: Family Medicine | Admitting: Family Medicine

## 2014-07-19 ENCOUNTER — Ambulatory Visit
Admit: 2014-07-19 | Disposition: A | Discharge status: Home / Self Care | Payer: Self-pay | Attending: Family Medicine | Admitting: Family Medicine

## 2014-07-20 NOTE — Discharge Summary (Signed)
PATIENT NAME:  Peter Johnston, Peter Johnston MR#:  [id number] DATE OF BIRTH:  1940-08-23  DATE OF ADMISSION:  04/28/2011 DATE OF DISCHARGE:  05/01/2011  ADMITTING PHYSICIAN: Fritzi Mandes, MD   DISCHARGING PHYSICIAN: Gladstone Lighter, MD    PRIMARY MD: Ronette Deter, MD   PRIMARY PULMONOLOGIST: Devona Konig, MD   Mobile: None.   DISCHARGE DIAGNOSES:  1. Acute hypoxic respiratory failure.  2. Chronic obstructive pulmonary disease exacerbation.  3. Acute bronchitis.  4. Chronic respiratory failure on 3 liters home oxygen.  5. Gastroesophageal reflux disease.  6. Iron deficiency anemia.  7. Gout.  8. Hyperlipidemia.  9. Arthritis.  10. Tobacco use disorder. He quit on 03/29/2011.  11. History of Guillain-Barre syndrome.  12. Right foot MRSA infection in February 2012.   DISCHARGE MEDICATIONS:  1. Advair 250/50 1 puff b.i.d.  2. Atenolol 25 mg p.o. daily.  3. Allopurinol 300 mg p.o. daily.  4. Plaquenil 200 mg p.o. b.i.d.  5. TriCor 145 mg p.o. daily.  6. Tramadol 50 mg p.o. q.6 hours p.r.n.  7. Travatan 0.004% ophthalmic solution one drop to each eye at nighttime.  8. Ventolin inhaler 4 times a day.  9. Spiriva 1 puff daily.  10. Theophylline 100 mg extended-release capsule 1 capsule daily.  11. Prednisone taper.  12. Levaquin 500 mg p.o. daily until 05/05/2011.  13. Nexium 40 mg p.o. b.i.d.  14. Ferrous sulfate 325 mg p.o. b.i.d. with meals.   HOME OXYGEN: 3 to 4 liters. He needs to 3 liters at rest and 4 liters on exertion.   DISCHARGE DIET: Low sodium diet.   DISCHARGE ACTIVITY: As tolerated.    FOLLOWUP INSTRUCTIONS:  1. Follow-up with PCP in 1 to 2 weeks.  2. Follow-up with Pulmonary in two weeks.  3. GI follow-up with Dr. Gustavo Lah for iron deficiency anemia in two weeks.  4. Continuous home oxygen.   LABS AT THE TIME OF DISCHARGE: WBC 11.9, hemoglobin 9.0, hematocrit 28.4, platelet count 288, MCV 78, sodium 140, potassium 4.3, chloride 104, bicarb  29, BUN 19, creatinine 0.91, glucose 142, calcium 9.2. Iron panel serum iron 21, iron binding capacity 441. Stool for occult blood positive. Ferritin 26. Echocardiogram showed normal LV systolic function, mild diastolic dysfunction with mild pulmonary hypertension. Blood cultures have remained negative while in the hospital. Cardiac enzymes are negative on admission. Chest x-ray on admission showing left lower lobe atelectasis versus infiltrate and emphysematous bullous disease in the lung apices. Repeat chest x-ray at the time of discharge on 05/01/2011 showing clear lung fields hyperinflated secondary to chronic obstructive pulmonary disease. No parenchymal infiltrate, effusion, or pneumothorax seen.   BRIEF HOSPITAL COURSE: Mr. Conery is a 74 year old Caucasian male with past medical history significant for chronic respiratory failure secondary to COPD/emphysema on nocturnal home oxygen and hypertension who presented secondary to worsening dyspnea and also hypoxia. He was saturating in 70's on room air and was placed on BiPAP.  1. Acute hypoxic respiratory failure secondary to COPD exacerbation and acute bronchitis. He was initially admitted to tele floor on BiPAP and his sats improved. He has been requiring 3 liters oxygen on minimal exertion and also at rest. He was on Solu-Medrol tapering dose and has been changed to p.o. prednisone at the time of discharge. He already tried azithromycin for outpatient bronchitis but was having cough with elevated white count when he came in so was changed to Levaquin. He will finish the course of Levaquin on February 7th. He will continue his Singulair,  Spiriva, Advair, and also Ventolin inhaler as needed. He was treated with neb treatments while in the hospital. He can follow-up with Dr. Devona Konig as an outpatient. His follow-up chest x-ray did not show any infiltrate. He was requiring 4 liters of oxygen on exertion and he said that he has been on continuous oxygen at  some point recently at home so will discharge him on 3 to 4 liters oxygen.  2. Microcytic iron deficiency anemia. Stool guaiac is positive. Low iron level. He was seen by Dr. Gustavo Lah for a similar issue in 2010, had an upper GI endoscopy and also colonoscopy. The EGD showed nonbleeding erosive gastropathy and colonoscopy showed diverticulosis. His hemoglobin is stable so will discharge him on p.o. iron pills twice a day and also Nexium b.i.d. and outpatient GI follow-up for need for repeat endoscopy.   His course has been otherwise uneventful while in the hospital.   DISCHARGE CONDITION: Stable.   DISCHARGE DISPOSITION: Home with home oxygen.   TIME SPENT ON DISCHARGE: 45 minutes.   ____________________________ Gladstone Lighter, MD rk:drc D: 05/01/2011 11:31:28 ET T: 05/02/2011 13:50:00 ET JOB#: [id number]  cc: Gladstone Lighter, MD, <Dictator> Eduard Clos. Gilford Rile, MD Lollie Sails, MD Allyne Gee, MD Gladstone Lighter MD ELECTRONICALLY SIGNED 05/03/2011 13:39

## 2014-07-20 NOTE — H&P (Signed)
PATIENT NAME:  Peter Johnston, Peter Johnston MR#:  [id number] DATE OF BIRTH:  1940-04-19  DATE OF ADMISSION:  04/28/2011  PRIMARY CARE PHYSICIAN: Ronette Deter, MD   PULMONOLOGIST: Allyne Gee, MD    CHIEF COMPLAINT: Shortness of breath.   HISTORY OF PRESENT ILLNESS: Mr. Peter Johnston is a 74 year old Caucasian gentleman with past medical history of chronic obstructive pulmonary disease/emphysema, arthritis, hypertension hypercholesterolemia, comes to the Emergency Room accompanied by family members with increasing shortness of breath and cough. Mr. Peter Johnston has oxygen at home; however, he uses this mainly doing the nighttime. He occasionally uses it during the daytime during some exertion. He comes into the Emergency Room feeling very short of breath, barely moving any air, and was found to be hypoxic with saturations in the upper 70s. The patient currently is on BiPAP. His saturations are anywhere from  88 to 94%. An ABG done earlier is pending. Chest x-ray reveals emphysema with some fibrosis. He is being admitted for further evaluation and management. The patient received a dose of IV Solu-Medrol, continues nebulizers, and a dose of Zithromax.  PAST MEDICAL/SURGICAL HISTORY:  1. MRSA wound infection in the lower extremity due to surgery due to hammertoe.  2. Hypercholesterolemia.  3. History of Guillain-Barre syndrome in 1986.  4. Tobacco abuse, quit 03/29/2011.  5. Alcohol abuse.  6. Peptic ulcer disease.  7. History of gout.  8. Severe emphysema, on home oxygen. The patient mainly uses at nighttime.  9. Arthritis.  10. Tonsillectomy.  11. Umbilical hernia repair.    ALLERGIES: Augmentin, gold-containing compounds, sulfa, and Vioxx.    MEDICATIONS:  1. Advair 250/50, 1 puff b.i.d.  2. Allopurinol 300 mg p.o. daily.  3. Atenolol 25 mg daily.  4. Fenofibrate 145 mg daily.  5. Hydroxychloroquine 200 mg b.i.d.  6. Plaquenil 200 mg b.i.d.  7. Spiriva 1 puff inhalation daily.  8. Theophylline-24, 100  mg, 1 capsule daily.  9. Tramadol 50 mg q.i.d.   10. Travatan Z  0.004%, 1 drop to each affected eye daily.  11. TriCor 145 mg daily.  12. Ventolin HFA 1 puff q.i.d.     REVIEW OF SYSTEMS: CONSTITUTIONAL: No fever. Positive for fatigue and weakness. EYES: No blurred or double vision. ENT: No tinnitus, ear pain, hearing loss. RESPIRATORY: Positive for cough, shortness of breath. CARDIOVASCULAR: No chest pain, orthopnea, edema. Positive for hypertension. GI: No nausea, vomiting, diarrhea, abdominal pain. GU: No dysuria or hematuria. ENDOCRINE: No polyuria or nocturia. HEMATOLOGY: No anemia or easy bruising. SKIN: No acne or rash. NEUROLOGIC: No cerebrovascular accident or transient ischemic attack. PSYCHIATRIC: No anxiety or depression. All other systems reviewed and negative.   PHYSICAL EXAMINATION:  GENERAL: The patient is awake, alert, oriented x3, in mild-to-moderate distress due to respiratory failure. He is afebrile. Pulse is 90. Saturations are 79% on room air.    HEENT: Atraumatic, normocephalic. Pupils are equal, round, and reactive to light and accommodation. Extraocular movements are intact. Oral mucosa is dry.   NECK: Supple. No JVD. No carotid bruits.   RESPIRATORY: There are decreased breath sounds bilaterally in the bases. I could hear some respiratory sounds, breath sounds in the upper lungs. No rhonchi are audible wheezing heard.   CARDIOVASCULAR: Tachycardia present. No murmur heard. PMI is not lateralized. Chest nontender. Good pedal pulses, good femoral pulses. Trace lower extremity edema.   EXTREMITIES: The patient has on the plantar aspect of his left foot a chronic dried area that appears to be a postsurgical wound which appears to be  healing.   NEUROLOGICAL: Grossly intact cranial nerves II through XII. No major motor or sensory deficits.   PSYCHIATRIC: The patient is awake, alert, and oriented x3.   LABORATORY, DIAGNOSTIC AND RADIOLOGICAL DATA:  Cardiac enzymes are  negative.  White count 12.1, hemoglobin and hematocrit is 10.8 and 33.9, platelet count is 358, MCV is 78. Glucose is 87, BUN is 20. The rest of the chemistry is normal. Albumin is 3.2. Troponin is less than 0.02.  EKG is normal sinus rhythm.  Arterial blood gas: pH is 7.42, pCO2 is 43, pO2 106, FiO2 of 35 with saturations 98.  Lactic acid is 0.8.  ASSESSMENT: Mr. Peter Johnston is a 74 year old with:  1. Acute on chronic hypoxic respiratory failure due to severe chronic obstructive pulmonary disease flare. The patient was hypoxic with saturations in the 70s.  He was cyanotic at home, per family.  2. Mild acute bronchitis. Cough mostly dry with white phlegm. No fever. Chest x-ray likely shows fibrosis with severe emphysema.  3. Hyperlipidemia.  4. Rheumatoid arthritis.  5. Gout.  6. History of severe tobacco abuse, quit 03/29/2011.  7. Methicillin-resistant Staphylococcus aureus foot infection of the right foot in February 2012.   PLAN:  1. Admit the patient to the Critical Care Unit.   2. IV fluids.  3. We will start the patient on IV Solu-Medrol 60 every 6 hours and taper when indicated.  4. Oral albuterol inhaler along with DuoNebs every 4 hours.  5. Continue Spiriva and Advair.  6. We will add Zithromax.  7. Lovenox for deep vein thrombosis prophylaxis.  8. Continue all home medications.  9. Dr. Humphrey Rolls to see the patient in the morning.  10. Further work-up according to the patient's clinical course.   The hospital admission plan was discussed with the patient and the patient's family members, who are agreeable to it.   CODE STATUS:  FULL CODE.     CRITICAL TIME SPENT: 60 minutes.   ____________________________ Hart Rochester Posey Pronto, MD sap:cbb D: 04/28/2011 20:26:36 ET T: 04/29/2011 07:07:15 ET JOB#: [id number]  cc: Gervis Gaba A. Posey Pronto, MD, <Dictator> Eduard Clos. Gilford Rile, MD Allyne Gee, MD Ilda Basset MD ELECTRONICALLY SIGNED 04/30/2011 15:41

## 2014-07-22 ENCOUNTER — Encounter: Payer: Self-pay | Admitting: Internal Medicine

## 2014-07-22 DIAGNOSIS — I2510 Atherosclerotic heart disease of native coronary artery without angina pectoris: Secondary | ICD-10-CM | POA: Insufficient documentation

## 2014-08-15 ENCOUNTER — Ambulatory Visit: Payer: Medicare Other | Admitting: Internal Medicine

## 2014-08-15 ENCOUNTER — Other Ambulatory Visit: Payer: Self-pay | Admitting: Internal Medicine

## 2014-08-17 ENCOUNTER — Other Ambulatory Visit: Payer: Self-pay | Admitting: Internal Medicine

## 2014-09-08 ENCOUNTER — Other Ambulatory Visit: Payer: Self-pay | Admitting: *Deleted

## 2014-09-08 MED ORDER — OMEPRAZOLE 40 MG PO CPDR: 40.0000 mg | DELAYED_RELEASE_CAPSULE | Freq: Every day | ORAL | Status: DC

## 2014-09-08 MED ORDER — RANITIDINE HCL 150 MG PO TABS: 150.0000 mg | ORAL_TABLET | Freq: Every day | ORAL | Status: DC

## 2014-09-11 ENCOUNTER — Other Ambulatory Visit: Payer: Self-pay | Admitting: *Deleted

## 2014-09-11 DIAGNOSIS — N400 Enlarged prostate without lower urinary tract symptoms: Secondary | ICD-10-CM

## 2014-10-09 ENCOUNTER — Other Ambulatory Visit: Payer: Self-pay | Admitting: Internal Medicine

## 2014-11-13 ENCOUNTER — Other Ambulatory Visit: Payer: Self-pay | Admitting: Internal Medicine

## 2014-11-13 NOTE — Telephone Encounter (Signed)
RX sent to pharmacy  

## 2014-12-22 ENCOUNTER — Ambulatory Visit (INDEPENDENT_AMBULATORY_CARE_PROVIDER_SITE_OTHER): Payer: Medicare Other | Admitting: Internal Medicine

## 2014-12-22 ENCOUNTER — Encounter (INDEPENDENT_AMBULATORY_CARE_PROVIDER_SITE_OTHER): Payer: Self-pay

## 2014-12-22 ENCOUNTER — Encounter: Payer: Self-pay | Admitting: Internal Medicine

## 2014-12-22 VITALS — BP 108/65 | HR 71 | Temp 98.0°F | Ht 66.5 in | Wt 160.0 lb

## 2014-12-22 DIAGNOSIS — M0520 Rheumatoid vasculitis with rheumatoid arthritis of unspecified site: Secondary | ICD-10-CM

## 2014-12-22 DIAGNOSIS — Z720 Tobacco use: Secondary | ICD-10-CM

## 2014-12-22 DIAGNOSIS — I00 Rheumatic fever without heart involvement: Secondary | ICD-10-CM

## 2014-12-22 DIAGNOSIS — Z23 Encounter for immunization: Secondary | ICD-10-CM | POA: Diagnosis not present

## 2014-12-22 DIAGNOSIS — J449 Chronic obstructive pulmonary disease, unspecified: Secondary | ICD-10-CM | POA: Diagnosis not present

## 2014-12-22 LAB — COMPREHENSIVE METABOLIC PANEL
ALT: 17 U/L (ref 0–53)
AST: 24 U/L (ref 0–37)
Albumin: 3.7 g/dL (ref 3.5–5.2)
Alkaline Phosphatase: 38 U/L — ABNORMAL LOW (ref 39–117)
BILIRUBIN TOTAL: 0.3 mg/dL (ref 0.2–1.2)
BUN: 25 mg/dL — ABNORMAL HIGH (ref 6–23)
CALCIUM: 9.3 mg/dL (ref 8.4–10.5)
CO2: 28 meq/L (ref 19–32)
Chloride: 101 mEq/L (ref 96–112)
Creatinine, Ser: 1.04 mg/dL (ref 0.40–1.50)
GFR: 73.84 mL/min (ref >60.00)
Glucose, Bld: 108 mg/dL — ABNORMAL HIGH (ref 70–99)
Potassium: 3.9 mEq/L (ref 3.5–5.1)
Sodium: 138 mEq/L (ref 135–145)
Total Protein: 7.4 g/dL (ref 6.0–8.3)

## 2014-12-22 MED ORDER — PREDNISONE 10 MG PO TABS: ORAL_TABLET | ORAL | Status: DC

## 2014-12-22 NOTE — Progress Notes (Signed)
Subjective:    Patient ID: Peter Johnston, male    DOB: 1940-09-02, 74 y.o.   MRN: [id number]  HPI  74YO male presents for follow up.  COPD - Continues to smoke. Continues to feel short of breath. On supplemental oxygen 3L to 3.5L. Describes himself as a chain smoker.   Over last few days, mucous seems worse. Feels more short of breath. Worse in mornings.  Wt Readings from Last 3 Encounters:  12/22/14 160 lb (72.576 kg)  06/19/14 158 lb (71.668 kg)  12/06/13 158 lb 8 oz (71.895 kg)   BP Readings from Last 3 Encounters:  12/22/14 108/65  06/19/14 120/71  12/06/13 118/72    Past Medical History  Diagnosis Date  . COPD (chronic obstructive pulmonary disease)   . Arthritis   . Hypertension    Family History  Problem Relation Age of Onset  . Stroke Father   . Heart disease Father   . Hypertension Father   . Heart disease Brother 78   Past Surgical History  Procedure Laterality Date  . Hammer toe surgery  2011, 2012    x 2  . Abdominal hernia repair     Social History   Social History  . Marital Status: Widowed    Spouse Name: N/A  . Number of Children: 2  . Years of Education: N/A   Occupational History  . Retired     Social History Main Topics  . Smoking status: Current Every Day Smoker  . Smokeless tobacco: Never Used  . Alcohol Use: Yes     Comment: 2-3 beers at night  . Drug Use: No  . Sexual Activity: Not Asked   Other Topics Concern  . None   Social History Narrative   Lives in [place] with son.      2 children, 2 grandchildren             Review of Systems  Constitutional: Positive for fatigue. Negative for fever, chills, activity change, appetite change and unexpected weight change.  Eyes: Negative for visual disturbance.  Respiratory: Positive for cough, chest tightness, shortness of breath and wheezing.   Cardiovascular: Negative for chest pain, palpitations and leg swelling.  Gastrointestinal: Negative for nausea, abdominal  pain, diarrhea, constipation and abdominal distention.  Genitourinary: Negative for dysuria, urgency and difficulty urinating.  Musculoskeletal: Negative for arthralgias and gait problem.  Skin: Negative for color change and rash.  Hematological: Negative for adenopathy.  Psychiatric/Behavioral: Positive for sleep disturbance. Negative for dysphoric mood. The patient is not nervous/anxious.        Objective:    BP 108/65 mmHg  Pulse 71  Temp(Src) 98 F (36.7 C) (Oral)  Ht 5' 6.5" (1.689 m)  Wt 160 lb (72.576 kg)  BMI 25.44 kg/m2  SpO2 90% Physical Exam  Constitutional: He is oriented to person, place, and time. He appears well-developed and well-nourished. No distress.  HENT:  Head: Normocephalic and atraumatic.  Right Ear: External ear normal.  Left Ear: External ear normal.  Nose: Nose normal.  Mouth/Throat: Oropharynx is clear and moist. No oropharyngeal exudate.  Eyes: Conjunctivae and EOM are normal. Pupils are equal, round, and reactive to light. Right eye exhibits no discharge. Left eye exhibits no discharge. No scleral icterus.  Neck: Normal range of motion. Neck supple. No tracheal deviation present. No thyromegaly present.  Cardiovascular: Normal rate, regular rhythm and normal heart sounds.  Exam reveals no gallop and no friction rub.   No murmur heard. Pulmonary/Chest: Accessory muscle  usage present. Tachypnea noted. No respiratory distress. He has decreased breath sounds. He has wheezes. He has no rhonchi. He has no rales. He exhibits no tenderness.  Very poor air movement. Few scattered wheezes.  Musculoskeletal: Normal range of motion. He exhibits no edema.  Lymphadenopathy:    He has no cervical adenopathy.  Neurological: He is alert and oriented to person, place, and time. No cranial nerve deficit. Coordination normal.  Skin: Skin is warm and dry. No rash noted. He is not diaphoretic. No erythema. No pallor.  Psychiatric: He has a normal mood and affect. His  behavior is normal. Judgment and thought content normal.          Assessment & Plan:   Problem List Items Addressed This Visit      Unprioritized   COPD, severe - Primary    Severe COPD with exacerbation. He understands that continued smoking will lead to his death, given already severe COPD. Very poor air movement on exam today. Will start Prednisone taper. Follow up here if symptoms are not improving or with pulmonary. Continue supplemental oxygen 3L [place]. Flu vaccine today. Continue Duoneb and Breo.      Relevant Medications   predniSONE (DELTASONE) 10 MG tablet   Rheumatoid arteritis    Reviewed notes from Dr. Jefm Bryant. Continue current medications.      Relevant Medications   predniSONE (DELTASONE) 10 MG tablet   Other Relevant Orders   Comprehensive metabolic panel   Tobacco abuse    Strongly encouraged smoking cessation.       Other Visit Diagnoses    Encounter for immunization            Return in about 6 months (around 06/21/2015) for Physical.

## 2014-12-22 NOTE — Assessment & Plan Note (Signed)
Reviewed notes from Dr. Jefm Bryant. Continue current medications.

## 2014-12-22 NOTE — Progress Notes (Signed)
Pre visit review using our clinic review tool, if applicable. No additional management support is needed unless otherwise documented below in the visit note. 

## 2014-12-22 NOTE — Patient Instructions (Addendum)
Labs today.  Start Prednisone taper. Follow up if symptoms are not improving.  Please look into coverage for Nicotine patches to help with smoking cessation.  Follow up in 6 months or sooner as needed.

## 2014-12-22 NOTE — Assessment & Plan Note (Addendum)
Severe COPD with exacerbation. He understands that continued smoking will lead to his death, given already severe COPD. Very poor air movement on exam today. Will start Prednisone taper. Follow up here if symptoms are not improving or with pulmonary. Continue supplemental oxygen 3L [place]. Flu vaccine today. Continue Duoneb and Breo.

## 2014-12-22 NOTE — Assessment & Plan Note (Signed)
Strongly encouraged smoking cessation. 

## 2015-01-06 ENCOUNTER — Other Ambulatory Visit: Payer: Self-pay | Admitting: Internal Medicine

## 2015-01-08 ENCOUNTER — Other Ambulatory Visit: Payer: Self-pay

## 2015-01-08 MED ORDER — IPRATROPIUM-ALBUTEROL 0.5-2.5 (3) MG/3ML IN SOLN: 3.0000 mL | RESPIRATORY_TRACT | Status: DC | PRN

## 2015-02-12 ENCOUNTER — Other Ambulatory Visit: Payer: Self-pay | Admitting: Internal Medicine

## 2015-02-17 ENCOUNTER — Other Ambulatory Visit: Payer: Self-pay | Admitting: Internal Medicine

## 2015-02-18 ENCOUNTER — Other Ambulatory Visit: Payer: Self-pay | Admitting: Internal Medicine

## 2015-03-16 ENCOUNTER — Telehealth: Payer: Self-pay | Admitting: *Deleted

## 2015-03-16 NOTE — Telephone Encounter (Signed)
Left voicemail for patient notifyng them that it is time to schedule follow up lung cancer screening CT scan. Instructed patient to call back to verify information prior to the scan being scheduled.

## 2015-05-11 ENCOUNTER — Other Ambulatory Visit: Payer: Self-pay | Admitting: Internal Medicine

## 2015-07-20 ENCOUNTER — Telehealth: Payer: Self-pay | Admitting: *Deleted

## 2015-07-20 NOTE — Telephone Encounter (Signed)
Spoke with patient this afternoon regarding follow up lung cancer screening with the low dose CT scan. He is " not interested in having anymore screening scans done." Patient is not basing this decision on financial reasons.

## 2015-07-27 ENCOUNTER — Telehealth: Payer: Self-pay | Admitting: *Deleted

## 2015-07-27 NOTE — Telephone Encounter (Signed)
Patient called back stating he misunderstood last week. He does want his  follow up  lung cancer screening low dose CT scan is due. Confirmed that patient is within age range of 55-77, asymptomatic of lung cancer, and no other serious disease processes that would make treatment of lung cancer not possible. The patient is a  current smoker with a 100 pack year history. The shared decision making visit was completed 07/18/14. The patient is agreeable for CT scan to be scheduled.

## 2015-07-31 ENCOUNTER — Other Ambulatory Visit: Payer: Self-pay | Admitting: Family Medicine

## 2015-07-31 DIAGNOSIS — Z87891 Personal history of nicotine dependence: Secondary | ICD-10-CM

## 2015-08-11 ENCOUNTER — Ambulatory Visit
Admission: RE | Admit: 2015-08-11 | Discharge: 2015-08-11 | Disposition: A | Discharge status: Home / Self Care | Payer: Medicare Other | Source: Ambulatory Visit | Attending: Family Medicine | Admitting: Family Medicine

## 2015-08-11 DIAGNOSIS — Z87891 Personal history of nicotine dependence: Secondary | ICD-10-CM

## 2015-08-11 DIAGNOSIS — I700 Atherosclerosis of aorta: Secondary | ICD-10-CM | POA: Insufficient documentation

## 2015-08-11 DIAGNOSIS — I2510 Atherosclerotic heart disease of native coronary artery without angina pectoris: Secondary | ICD-10-CM | POA: Diagnosis not present

## 2015-08-11 DIAGNOSIS — J432 Centrilobular emphysema: Secondary | ICD-10-CM | POA: Diagnosis not present

## 2015-08-13 ENCOUNTER — Telehealth: Payer: Self-pay | Admitting: *Deleted

## 2015-08-13 ENCOUNTER — Other Ambulatory Visit: Payer: Self-pay | Admitting: Family Medicine

## 2015-08-13 NOTE — Telephone Encounter (Signed)
Notified patient of LDCT lung cancer screening results as noted below. Also notified of incidental finding noted below. After discussion with PCP, appointment made with Dr. Nestor Lewandowsky for 08/18/15 11:15 am. Patient verbalizes understanding.   IMPRESSION: 1. Lung-RADS Category 4BS, suspicious. Additional imaging evaluation or consultation with pulmonary medicine or thoracic surgery recommended. 2. The "S" modifier above refers to potentially clinically significant non lung cancer related findings. Specifically, there is extensive atherosclerosis, including three-vessel coronary artery disease. Please note that although the presence of coronary artery calcium documents the presence of coronary artery disease, the severity of this disease and any potential stenosis cannot be assessed on this non-gated CT examination. Assessment for potential risk factor modification, dietary therapy or pharmacologic therapy may be warranted, if clinically indicated. 3. Diffuse bronchial wall thickening with severe centrilobular and paraseptal emphysema; imaging findings suggestive of underlying COPD. These results will be called to the ordering clinician or representative by the Radiologist Assistant, and communication documented in the PACS or zVision Dashboard.

## 2015-08-15 ENCOUNTER — Other Ambulatory Visit: Payer: Self-pay | Admitting: Internal Medicine

## 2015-08-17 DIAGNOSIS — H409 Unspecified glaucoma: Secondary | ICD-10-CM | POA: Insufficient documentation

## 2015-08-17 DIAGNOSIS — K279 Peptic ulcer, site unspecified, unspecified as acute or chronic, without hemorrhage or perforation: Secondary | ICD-10-CM | POA: Insufficient documentation

## 2015-08-17 DIAGNOSIS — M810 Age-related osteoporosis without current pathological fracture: Secondary | ICD-10-CM | POA: Insufficient documentation

## 2015-08-17 DIAGNOSIS — M109 Gout, unspecified: Secondary | ICD-10-CM | POA: Insufficient documentation

## 2015-08-17 DIAGNOSIS — M069 Rheumatoid arthritis, unspecified: Secondary | ICD-10-CM | POA: Insufficient documentation

## 2015-08-18 ENCOUNTER — Encounter: Payer: Self-pay | Admitting: Cardiothoracic Surgery

## 2015-08-18 ENCOUNTER — Telehealth: Payer: Self-pay

## 2015-08-18 ENCOUNTER — Ambulatory Visit (INDEPENDENT_AMBULATORY_CARE_PROVIDER_SITE_OTHER): Payer: Medicare Other | Admitting: Cardiothoracic Surgery

## 2015-08-18 VITALS — BP 113/71 | HR 69 | Temp 98.4°F | Resp 28 | Ht 69.0 in | Wt 160.0 lb

## 2015-08-18 DIAGNOSIS — R918 Other nonspecific abnormal finding of lung field: Secondary | ICD-10-CM

## 2015-08-18 NOTE — Patient Instructions (Signed)
We will schedule a PET Scan and have you seen by a oncologist (Dr. Rogue Bussing), as we do not believe that as bad as your heart is, that you will not be able to tolerate a surgery to remove this.  I will call you with your PET Scan appointment and your appointment with the Cancer doctor.  Lung Cancer Lung cancer occurs when abnormal cells in the lung grow out of control and form a mass (tumor). There are several types of lung cancer. The two most common types are:  Non-small cell. In this type of lung cancer, abnormal cells are larger and grow more slowly than those of small cell lung cancer.  Small cell. In this type of lung cancer, abnormal cells are smaller than those of non-small cell lung cancer. Small cell lung cancer gets worse faster than non-small cell lung cancer. CAUSES  The leading cause of lung cancer is smoking tobacco. The second leading cause is radon exposure. RISK FACTORS  Smoking tobacco.  Exposure to secondhand tobacco smoke.  Exposure to radon gas.  Exposure to asbestos.  Exposure to arsenic in drinking water.  Air pollution.  Family or personal history of lung cancer.  Lung radiation therapy.  Being older than 48 years. SIGNS AND SYMPTOMS  In the early stages, symptoms may not be present. As the cancer progresses, symptoms may include:  A lasting cough, possibly with blood.  Fatigue.  Unexplained weight loss.  Shortness of breath.  Wheezing.  Chest pain.  Loss of appetite. Symptoms of advanced lung cancer include:  Hoarseness.  Bone or joint pain.  Weakness.  Nail problems.  Face or arm swelling.  Paralysis of the face.  Drooping eyelids. DIAGNOSIS  Lung cancer can be identified with a physical exam and with tests such as:  A chest X-ray.  A CT scan.  Blood tests.  A biopsy. After a diagnosis is made, you will have more tests to determine the stage of the cancer. The stages of non-small cell lung cancer are:  Stage 0, also  called carcinoma in situ. At this stage, abnormal cells are found in the inner lining of your lung or lungs.  Stage I. At this stage, abnormal cells have grown into a tumor that is no larger than 5 cm across. The cancer has entered the deeper lung tissue but has not yet entered the lymph nodes or other parts of the body.  Stage II. At this stage, the tumor is 7 cm across or smaller and has entered nearby lymph nodes. Or, the tumor is 5 cm across or smaller and has invaded surrounding tissue but is not found in nearby lymph nodes. There may be more than one tumor present.  Stage III. At this stage, the tumor may be any size. There may be more than one tumor in the lungs. The cancer cells have spread to the lymph nodes and possibly to other organs.  Stage IV. At this stage, there are tumors in both lungs and the cancer has spread to other areas of the body. The stages of small cell lung cancer are:  Limited. At this stage, the cancer is found only on one side of the chest.  Extensive. At this stage, the cancer is in the lungs and in tissues on the other side of the chest. The cancer has spread to other organs or is found in the fluid between the layers of your lungs. TREATMENT  Depending on the type and stage of your lung cancer, you may  be treated with:  Surgery. This is done to remove a tumor.  Radiation therapy. This treatment destroys cancer cells using X-rays or other types of radiation.  Chemotherapy. This treatment uses medicines to destroy cancer cells.  Targeted therapy. This treatment aims to destroy only cancer cells instead of all cells as other therapies do. You may also have a combination of treatments. HOME CARE INSTRUCTIONS   Do not use any tobacco products. This includes cigarettes, chewing tobacco, and electronic cigarettes. If you need help quitting, ask your health care provider.  Take medicines only as directed by your health care provider.  Eat a healthy diet. Work  with a dietitian to make sure you are getting the nutrition you need.  Consider joining a support group or seeking counseling to help you cope with the stress of having lung cancer.  Let your cancer specialist (oncologist) know if you are admitted to the hospital.  Keep all follow-up visits as directed by your health care provider. This is important. SEEK MEDICAL CARE IF:   You lose weight without trying.  You have a persistent cough and wheezing.  You feel short of breath.  You tire easily.  You experience bone or joint pain.  You have difficulty swallowing.  You feel hoarse or notice your voice changing.  Your pain medicine is not helping. SEEK IMMEDIATE MEDICAL CARE IF:   You cough up blood.  You have new breathing problems.  You develop chest pain.  You develop swelling in:  One or both ankles or legs.  Your face, neck, or arms.  You are confused.  You experience paralysis in your face or a drooping eyelid.   This information is not intended to replace advice given to you by your health care provider. Make sure you discuss any questions you have with your health care provider.   Document Released: 06/20/2000 Document Revised: 12/03/2014 Document Reviewed: 07/18/2013 Elsevier Interactive Patient Education [employer].

## 2015-08-18 NOTE — Telephone Encounter (Addendum)
Patient seen in office today. PET Scan ordered for proper staging of lung mass. PET has been scheduled for 08/21/15, noon, NPO 6 hours prior.  Patient will need referral to Dr. Rogue Bussing. Please place referral.

## 2015-08-18 NOTE — Telephone Encounter (Signed)
Please Pre-Cert for PET and place referral.  I will call patient as soon as appointment with Dr. Rogue Bussing has been made.

## 2015-08-18 NOTE — Progress Notes (Signed)
Patient ID: Peter Johnston, male   DOB: [birth year], 74 y.o.   MRN: [id number]  Chief Complaint  Patient presents with  . Lung Mass    Left Upper Lobe    Referred By Posey Pronto Reason for Referral left upper lobe mass  HPI Location, Quality, Duration, Severity, Timing, Context, Modifying Factors, Associated Signs and Symptoms.  Peter Johnston is a 75 y.o. male.  He has a long-standing history of oxygen dependent COPD and had a CT scan made a year ago revealing a left upper lobe mass which was about 7 mm. This was performed as part of the lung cancer screening trial and a repeat CT scan a year later confirmed the presence of the mass which had now increased to about 11 mm. The patient states that he is currently on 3 L of oxygen per minute but currently is only using 2 in order to conserve oxygen. His oxygen saturations were 89% today on room air. Unfortunately does continue to smoke. There is no family history of lung cancer. Of note is that he is on Enbrel for his rheumatoid arthritis. He is quite limited functionally. He must be on oxygen at all times. He is unable to do much in the way of activities without being short of breath.   Past Medical History  Diagnosis Date  . COPD (chronic obstructive pulmonary disease) ([place])   . Arthritis   . Hypertension     Past Surgical History  Procedure Laterality Date  . Hammer toe surgery  2011, 2012    x 2- Dr. Elvina Mattes  . Abdominal hernia repair  1995    Dr. Rochel Brome    Family History  Problem Relation Age of Onset  . Stroke Father   . Heart disease Father   . Hypertension Father   . Heart disease Brother 65    Social History Social History  Substance Use Topics  . Smoking status: Current Every Day Smoker -- 2.00 packs/day for 63 years  . Smokeless tobacco: Never Used  . Alcohol Use: Yes     Comment: 2-3 beers at night    Allergies  Allergen Reactions  . Tamsulosin Shortness Of Breath and Other (See Comments)     dizziness  . Amoxicillin-Pot Clavulanate Nausea Only  . Gold-Containing Drug Products Other (See Comments)    Proteinuria  . Hydrochlorothiazide Other (See Comments)    BP dropped  . Sulfa Antibiotics Nausea Only    Current Outpatient Prescriptions  Medication Sig Dispense Refill  . allopurinol (ZYLOPRIM) 300 MG tablet Take 300 mg by mouth daily.    Marland Kitchen atenolol (TENORMIN) 25 MG tablet TAKE 1 TABLET DAILY 90 tablet 3  . ENBREL 50 MG/ML injection     . fenofibrate (TRICOR) 145 MG tablet TAKE 1 TABLET DAILY 90 tablet 1  . Fluticasone Furoate-Vilanterol (BREO ELLIPTA) 100-25 MCG/INH AEPB Inhale 1 puff into the lungs daily. 60 each 6  . hydroxychloroquine (PLAQUENIL) 200 MG tablet Take 200 mg by mouth daily.    Marland Kitchen ipratropium-albuterol (DUONEB) 0.5-2.5 (3) MG/3ML SOLN Inhale 3 mLs into the lungs every 4 (four) hours as needed. 90 mL 3  . omeprazole (PRILOSEC) 40 MG capsule TAKE 1 CAPSULE DAILY 90 capsule 3  . predniSONE (DELTASONE) 5 MG tablet Take 5 mg by mouth daily.     . theophylline (THEO-24) 100 MG 24 hr capsule Take 50 mg by mouth daily.     . traMADol (ULTRAM) 50 MG tablet TAKE ONE TABLET BY MOUTH  EVERY 12 HOURS AS NEEDED 60 tablet 0   No current facility-administered medications for this visit.      Review of Systems A complete review of systems was asked and was negative except for the following positive findingsFrequent cough, shortness of breath, wheezing, heartburn, joint pain, frequent headaches, depression, excessive thirst, easy bruising.  Blood pressure 113/71, pulse 69, temperature 98.4 F (36.9 C), temperature source Oral, resp. rate 28, height '5\' 9"'$  (1.753 m), weight 160 lb (72.576 kg), SpO2 89 %.  Physical Exam CONSTITUTIONAL:  Pleasant, well-developed, well-nourished, and in no acute distress. EYES: Pupils equal and reactive to light, Sclera non-icteric EARS, NOSE, MOUTH AND THROAT:  The oropharynx was clear.  Dentition is good repair.  Oral mucosa pink and  moist. LYMPH NODES:  Lymph nodes in the neck and axillae were normal RESPIRATORY:  Lungs were clear but very distant bilaterally..  Normal respiratory effort without pathologic use of accessory muscles of respiration CARDIOVASCULAR: Heart was regular without murmurs.  There were no carotid bruits. GI: The abdomen was soft, nontender, and nondistended. There were no palpable masses. There was no hepatosplenomegaly. There were normal bowel sounds in all quadrants. GU:  Rectal deferred.   MUSCULOSKELETAL:  Normal muscle strength and tone.  No clubbing or cyanosis.   SKIN:  There were no pathologic skin lesions.  There were no nodules on palpation. NEUROLOGIC:  Sensation is normal.  Cranial nerves are grossly intact. PSYCH:  Oriented to person, place and time.  Mood and affect are normal.  Data Reviewed Recent CT scan and compared with prior scans  I have personally reviewed the patient's imaging, laboratory findings and medical records.    Assessment    There is a left upper lobe mass which measures over a centimeter in size. It has increased over the last year. I believe that this is most likely a lung cancer. Given his severe COPD he is not a candidate for surgery.    Plan    We will obtain a PET scan and a referral to Dr. Fabienne Bruns. I did not make a return visit for but be happy to see him should the need arise.       Nestor Lewandowsky, MD 08/18/2015, 12:19 PM

## 2015-08-19 NOTE — Telephone Encounter (Signed)
I have sent a referral to [facility] through [place] for patient to be seen by Dr Rogue Bussing for oncology consultation. [facility] will call the patient with appointment date and time.

## 2015-08-21 ENCOUNTER — Encounter: Admission: RE | Admit: 2015-08-21 | Payer: Medicare Other | Source: Ambulatory Visit

## 2015-08-21 NOTE — Telephone Encounter (Signed)
Spoke with Col[place] at this time. Dr. Rogue Bussing will see patient on 08/26/15 at [facility] at 1115am.  Also, Patient was unaware of PET scan per caregiver. This has been rescheduled for 08/27/15 at 1130. Arrive at 1115am.  Called patient at this time to give appointment information. His caregiver answered. She is not on his Emergency Contact list, so I was unable to give her information but did leave a message with her to have the patient return my phone call.

## 2015-08-21 NOTE — Telephone Encounter (Signed)
Checked with [facility], they are trying to find patient an appointment date/time. She will call as soon as this is done.

## 2015-08-25 ENCOUNTER — Telehealth: Payer: Self-pay | Admitting: General Surgery

## 2015-08-25 NOTE — Telephone Encounter (Signed)
Spoke with patient at this time. All appointment information given. Patient also wanted his Emergency Contact updated with girlfriend's name and number so that I may give her information if needed. This was updated in EPIC.

## 2015-08-25 NOTE — Telephone Encounter (Signed)
Called both home and cell phones at this time. No answer on either number but messages were left on both asking for return phone call.

## 2015-08-25 NOTE — Telephone Encounter (Signed)
Patient was returning you call

## 2015-08-26 ENCOUNTER — Encounter: Payer: Self-pay | Admitting: Internal Medicine

## 2015-08-26 ENCOUNTER — Inpatient Hospital Stay: Payer: Medicare Other | Attending: Internal Medicine | Admitting: Internal Medicine

## 2015-08-26 ENCOUNTER — Inpatient Hospital Stay: Payer: Medicare Other

## 2015-08-26 VITALS — BP 135/82 | HR 62 | Temp 97.4°F | Resp 20 | Ht 69.0 in | Wt 156.5 lb

## 2015-08-26 DIAGNOSIS — R918 Other nonspecific abnormal finding of lung field: Secondary | ICD-10-CM

## 2015-08-26 DIAGNOSIS — F17210 Nicotine dependence, cigarettes, uncomplicated: Secondary | ICD-10-CM | POA: Diagnosis not present

## 2015-08-26 DIAGNOSIS — R05 Cough: Secondary | ICD-10-CM | POA: Diagnosis not present

## 2015-08-26 DIAGNOSIS — D509 Iron deficiency anemia, unspecified: Secondary | ICD-10-CM

## 2015-08-26 DIAGNOSIS — M129 Arthropathy, unspecified: Secondary | ICD-10-CM | POA: Diagnosis not present

## 2015-08-26 DIAGNOSIS — Z79899 Other long term (current) drug therapy: Secondary | ICD-10-CM | POA: Diagnosis not present

## 2015-08-26 DIAGNOSIS — I10 Essential (primary) hypertension: Secondary | ICD-10-CM | POA: Diagnosis not present

## 2015-08-26 DIAGNOSIS — I2510 Atherosclerotic heart disease of native coronary artery without angina pectoris: Secondary | ICD-10-CM | POA: Insufficient documentation

## 2015-08-26 DIAGNOSIS — Z7952 Long term (current) use of systemic steroids: Secondary | ICD-10-CM

## 2015-08-26 DIAGNOSIS — I700 Atherosclerosis of aorta: Secondary | ICD-10-CM | POA: Diagnosis not present

## 2015-08-26 DIAGNOSIS — J449 Chronic obstructive pulmonary disease, unspecified: Secondary | ICD-10-CM | POA: Diagnosis not present

## 2015-08-26 DIAGNOSIS — R0602 Shortness of breath: Secondary | ICD-10-CM | POA: Diagnosis not present

## 2015-08-26 LAB — CBC WITH DIFFERENTIAL/PLATELET
BASOS ABS: 0.1 10*3/uL (ref 0–0.1)
BASOS PCT: 1 %
Eosinophils Absolute: 0.2 10*3/uL (ref 0–0.7)
Eosinophils Relative: 2 %
HEMATOCRIT: 37.8 % — AB (ref 40.0–52.0)
Hemoglobin: 12.1 g/dL — ABNORMAL LOW (ref 13.0–18.0)
LYMPHS PCT: 24 %
Lymphs Abs: 2.6 10*3/uL (ref 1.0–3.6)
MCH: 26.6 pg (ref 26.0–34.0)
MCHC: 32 g/dL (ref 32.0–36.0)
MCV: 83.1 fL (ref 80.0–100.0)
Monocytes Absolute: 1.2 10*3/uL — ABNORMAL HIGH (ref 0.2–1.0)
Monocytes Relative: 11 %
NEUTROS ABS: 6.9 10*3/uL — AB (ref 1.4–6.5)
NEUTROS PCT: 62 %
Platelets: 288 10*3/uL (ref 150–440)
RBC: 4.55 MIL/uL (ref 4.40–5.90)
RDW: 16.8 % — ABNORMAL HIGH (ref 11.5–14.5)
WBC: 11 10*3/uL — AB (ref 3.8–10.6)

## 2015-08-26 LAB — COMPREHENSIVE METABOLIC PANEL
ALBUMIN: 3.9 g/dL (ref 3.5–5.0)
ALT: 16 U/L — AB (ref 17–63)
AST: 29 U/L (ref 15–41)
Alkaline Phosphatase: 43 U/L (ref 38–126)
Anion gap: 6 (ref 5–15)
BILIRUBIN TOTAL: 0.5 mg/dL (ref 0.3–1.2)
BUN: 22 mg/dL — AB (ref 6–20)
CHLORIDE: 100 mmol/L — AB (ref 101–111)
CO2: 30 mmol/L (ref 22–32)
CREATININE: 1.16 mg/dL (ref 0.61–1.24)
Calcium: 9.1 mg/dL (ref 8.9–10.3)
GFR calc Af Amer: >60 mL/min (ref >60)
GFR, EST NON AFRICAN AMERICAN: 59 mL/min — AB (ref >60)
GLUCOSE: 99 mg/dL (ref 65–99)
Potassium: 3.9 mmol/L (ref 3.5–5.1)
Sodium: 136 mmol/L (ref 135–145)
TOTAL PROTEIN: 7.9 g/dL (ref 6.5–8.1)

## 2015-08-26 NOTE — Progress Notes (Signed)
Pt new pt that has been monitored over 2 years for lung nodule and it got bigger on 2017 scan. He has been sch. For pet scan 6/1. He is sob on exertion and usually wears 3 liters of oxygen but because tank is small and he dec. To 2 liters.

## 2015-08-26 NOTE — Patient Instructions (Signed)
Smoking Cessation, Tips for Success If you are ready to quit smoking, congratulations! You have chosen to help yourself be healthier. Cigarettes bring nicotine, tar, carbon monoxide, and other irritants into your body. Your lungs, heart, and blood vessels will be able to work better without these poisons. There are many different ways to quit smoking. Nicotine gum, nicotine patches, a nicotine inhaler, or nicotine nasal spray can help with physical craving. Hypnosis, support groups, and medicines help break the habit of smoking. WHAT THINGS CAN I DO TO MAKE QUITTING EASIER?  Here are some tips to help you quit for good:  Pick a date when you will quit smoking completely. Tell all of your friends and family about your plan to quit on that date.  Do not try to slowly cut down on the number of cigarettes you are smoking. Pick a quit date and quit smoking completely starting on that day.  Throw away all cigarettes.   Clean and remove all ashtrays from your home, work, and car.  On a card, write down your reasons for quitting. Carry the card with you and read it when you get the urge to smoke.  Cleanse your body of nicotine. Drink enough water and fluids to keep your urine clear or pale yellow. Do this after quitting to flush the nicotine from your body.  Learn to predict your moods. Do not let a bad situation be your excuse to have a cigarette. Some situations in your life might tempt you into wanting a cigarette.  Never have "just one" cigarette. It leads to wanting another and another. Remind yourself of your decision to quit.  Change habits associated with smoking. If you smoked while driving or when feeling stressed, try other activities to replace smoking. Stand up when drinking your coffee. Brush your teeth after eating. Sit in a different chair when you read the paper. Avoid alcohol while trying to quit, and try to drink fewer caffeinated beverages. Alcohol and caffeine may urge you to  smoke.  Avoid foods and drinks that can trigger a desire to smoke, such as sugary or spicy foods and alcohol.  Ask people who smoke not to smoke around you.  Have something planned to do right after eating or having a cup of coffee. For example, plan to take a walk or exercise.  Try a relaxation exercise to calm you down and decrease your stress. Remember, you may be tense and nervous for the first 2 weeks after you quit, but this will pass.  Find new activities to keep your hands busy. Play with a pen, coin, or rubber band. Doodle or draw things on paper.  Brush your teeth right after eating. This will help cut down on the craving for the taste of tobacco after meals. You can also try mouthwash.   Use oral substitutes in place of cigarettes. Try using lemon drops, carrots, cinnamon sticks, or chewing gum. Keep them handy so they are available when you have the urge to smoke.  When you have the urge to smoke, try deep breathing.  Designate your home as a nonsmoking area.  If you are a heavy smoker, ask your health care provider about a prescription for nicotine chewing gum. It can ease your withdrawal from nicotine.  Reward yourself. Set aside the cigarette money you save and buy yourself something nice.  Look for support from others. Join a support group or smoking cessation program. Ask someone at home or at work to help you with your plan   to quit smoking.  Always ask yourself, "Do I need this cigarette or is this just a reflex?" Tell yourself, "Today, I choose not to smoke," or "I do not want to smoke." You are reminding yourself of your decision to quit.  Do not replace cigarette smoking with electronic cigarettes (commonly called e-cigarettes). The safety of e-cigarettes is unknown, and some may contain harmful chemicals.  If you relapse, do not give up! Plan ahead and think about what you will do the next time you get the urge to smoke. HOW WILL I FEEL WHEN I QUIT SMOKING? You  may have symptoms of withdrawal because your body is used to nicotine (the addictive substance in cigarettes). You may crave cigarettes, be irritable, feel very hungry, cough often, get headaches, or have difficulty concentrating. The withdrawal symptoms are only temporary. They are strongest when you first quit but will go away within 10-14 days. When withdrawal symptoms occur, stay in control. Think about your reasons for quitting. Remind yourself that these are signs that your body is healing and getting used to being without cigarettes. Remember that withdrawal symptoms are easier to treat than the major diseases that smoking can cause.  Even after the withdrawal is over, expect periodic urges to smoke. However, these cravings are generally short lived and will go away whether you smoke or not. Do not smoke! WHAT RESOURCES ARE AVAILABLE TO HELP ME QUIT SMOKING? Your health care provider can direct you to community resources or hospitals for support, which may include:  Group support.  Education.  Hypnosis.  Therapy.   This information is not intended to replace advice given to you by your health care provider. Make sure you discuss any questions you have with your health care provider.   Document Released: 12/11/2003 Document Revised: 04/04/2014 Document Reviewed: 08/30/2012 Elsevier Interactive Patient Education 2016 Elsevier Inc.  

## 2015-08-26 NOTE — Progress Notes (Signed)
[facility] NOTE  Patient Care Team: Jackolyn Confer, MD as PCP - General (Internal Medicine) Allyne Gee, MD (Pulmonary Disease)  CHIEF COMPLAINTS/PURPOSE OF CONSULTATION:   # April 2016- LUL LUNG NODULE [43m]; MAY 2017- 153m HISTORY OF PRESENTING ILLNESS:  Peter GOLDSTON89.o.  male long-standing history of smoking 1 fortunately continues to smoke; also history of COPD on 2-3 L of O2 has been referred was for further evaluation of his left upper lobe lung nodule.  Patient's lung nodule was picked up on lung cancer screening; more recently the left upper lobe lung nodule has been increasing in size- approximately 10 mm. He was felt not a surgical candidate by Dr. OaFaith Rogueiven his advanced COPD/comorbidities. He has been deferred was for further recommendations.  Patient continues to have chronic shortness of breath; chronic productive cough. No hemoptysis. Denies any weight loss. Denies any headaches. No chest pain.  Unusual weight loss. Appetite is fair. He denies any pain.  ROS: A complete 10 point review of system is done which is negative except mentioned above in history of present illness  MEDICAL HISTORY:  Past Medical History  Diagnosis Date  . COPD (chronic obstructive pulmonary disease) ([place]  . Arthritis   . Hypertension   . Lung mass   . IDA (iron deficiency anemia)     SURGICAL HISTORY: Past Surgical History  Procedure Laterality Date  . Hammer toe surgery  2011, 2012    x 2- Dr. TrElvina Mattes. Abdominal hernia repair  1995    Dr. WiRochel Brome  SOCIAL HISTORY: retired with [employer]; [place]; with girl friend; and son.  Social History   Social History  . Marital Status: Widowed    Spouse Name: N/A  . Number of Children: 2  . Years of Education: N/A   Occupational History  . Retired     Social History Main Topics  . Smoking status: Current Every Day Smoker -- 2.00 packs/day for 63 years    Types: Cigarettes  . Smokeless  tobacco: Never Used  . Alcohol Use: 0.0 oz/week    0 Standard drinks or equivalent per week     Comment: 2-3 beers at night  . Drug Use: No  . Sexual Activity: Not on file   Other Topics Concern  . Not on file   Social History Narrative   [place]      2 children, 2 grandchildren             FAMILY HISTORY: Family History  Problem Relation Age of Onset  . Stroke Father   . Heart disease Father   . Hypertension Father   . Heart disease Brother [id number]  ALLERGIES:  is allergic to tamsulosin; amoxicillin-pot clavulanate; gold-containing drug products; hydrochlorothiazide; and sulfa antibiotics.  MEDICATIONS:  Current Outpatient Prescriptions  Medication Sig Dispense Refill  . allopurinol (ZYLOPRIM) 300 MG tablet Take 300 mg by mouth daily.    . Marland Kitchentenolol (TENORMIN) 25 MG tablet TAKE 1 TABLET DAILY (Patient taking differently: TAKE 1 /2 TABLET DAILY) 90 tablet 3  . ENBREL 50 MG/ML injection 50 mg once a week.     . fenofibrate (TRICOR) 145 MG tablet TAKE 1 TABLET DAILY 90 tablet 1  . Fluticasone Furoate-Vilanterol (BREO ELLIPTA) 100-25 MCG/INH AEPB Inhale 1 puff into the lungs daily. 60 each 6  . hydroxychloroquine (PLAQUENIL) 200 MG tablet Take 200 mg by mouth daily.    .Marland Kitchen  ipratropium-albuterol (DUONEB) 0.5-2.5 (3) MG/3ML SOLN Inhale 3 mLs into the lungs every 4 (four) hours as needed. 90 mL 3  . omeprazole (PRILOSEC) 40 MG capsule TAKE 1 CAPSULE DAILY 90 capsule 3  . predniSONE (DELTASONE) 5 MG tablet Take 5 mg by mouth daily.     . theophylline (THEO-24) 100 MG 24 hr capsule Take 50 mg by mouth daily.     . traMADol (ULTRAM) 50 MG tablet TAKE ONE TABLET BY MOUTH EVERY 12 HOURS AS NEEDED 60 tablet 0   No current facility-administered medications for this visit.      Marland Kitchen  PHYSICAL EXAMINATION: ECOG PERFORMANCE STATUS: 2 - Symptomatic, <50% confined to bed  Filed Vitals:   08/26/15 1148  BP: 135/82  Pulse: 62  Temp: 97.4 F (36.3 C)  Resp: 20    Filed Weights   08/26/15 1148  Weight: 156 lb 8.4 oz (71 kg)    GENERAL: Well-nourished well-developed; Alert, no distress and comfortable.  He is alone. He is wearing oxygen. He is walking himself. EYES: no pallor or icterus OROPHARYNX: no thrush or ulceration.  NECK: supple, no masses felt LYMPH:  no palpable lymphadenopathy in the cervical, axillary or inguinal regions LUNGS: Bilateral decreased air entry ; no wheeze or crackles HEART/CVS: regular rate & rhythm and no murmurs; No lower extremity edema ABDOMEN: abdomen soft, non-tender and normal bowel sounds Musculoskeletal:no cyanosis of digits and no clubbing  PSYCH: alert & oriented x 3 with fluent speech NEURO: no focal motor/sensory deficits SKIN:  no rashes or significant lesions  LABORATORY DATA:  I have reviewed the data as listed Lab Results  Component Value Date   WBC 11.0* 08/26/2015   HGB 12.1* 08/26/2015   HCT 37.8* 08/26/2015   MCV 83.1 08/26/2015   PLT 288 08/26/2015    Recent Labs  12/22/14 1358 08/26/15 1230  NA 138 136  K 3.9 3.9  CL 101 100*  CO2 28 30  GLUCOSE 108* 99  BUN 25* 22*  CREATININE 1.04 1.16  CALCIUM 9.3 9.1  GFRNONAA  --  59*  GFRAA  --  >60  PROT 7.4 7.9  ALBUMIN 3.7 3.9  AST 24 29  ALT 17 16*  ALKPHOS 38* 43  BILITOT 0.3 0.5    RADIOGRAPHIC STUDIES: I have personally reviewed the radiological images as listed and agreed with the findings in the report. Ct Chest Lung Ca Screen Low Dose W/o Cm  08/11/2015  CLINICAL DATA:  75 year old male current smoker with 100 pack-year history of smoking. Lung cancer screening examination. EXAM: CT CHEST WITHOUT CONTRAST LOW-DOSE FOR LUNG CANCER SCREENING TECHNIQUE: Multidetector CT imaging of the chest was performed following the standard protocol without IV contrast. COMPARISON:  Low-dose lung cancer screening chest CT 07/18/2014. FINDINGS: Mediastinum/Nodes: Heart size is normal. There is no significant pericardial fluid, thickening or  pericardial calcification. There is atherosclerosis of the thoracic aorta, the great vessels of the mediastinum and the coronary arteries, including calcified atherosclerotic plaque in the left anterior descending, left circumflex and right coronary arteries. No pathologically enlarged mediastinal or hilar lymph nodes. Please note that accurate exclusion of hilar adenopathy is limited on noncontrast CT scans. Esophagus is unremarkable in appearance. No axillary lymphadenopathy. Lungs/Pleura: Compared to the prior study, one of the previously noted nodules are stable, one has resolved, and one has clearly increased. The concerning nodule in the left upper lobe (image 137 of series 3) currently has a volume derived mean diameter of 10.5 mm (previously mean diameter of  7.4 mm on 07/18/2014) and is macrolobulated with spiculated margins, highly concerning for a small bronchogenic carcinoma. Progressive scarring in the lingula. Diffuse bronchial wall thickening with severe centrilobular and paraseptal emphysema, including extensive bullous disease throughout the mid to upper lungs bilaterally. No acute consolidative airspace disease. No pleural effusions. Upper abdomen: Unremarkable. Musculoskeletal: There are no aggressive appearing lytic or blastic lesions noted in the visualized portions of the skeleton. IMPRESSION: 1. Lung-RADS Category 4BS, suspicious. Additional imaging evaluation or consultation with pulmonary medicine or thoracic surgery recommended. 2. The "S" modifier above refers to potentially clinically significant non lung cancer related findings. Specifically, there is extensive atherosclerosis, including three-vessel coronary artery disease. Please note that although the presence of coronary artery calcium documents the presence of coronary artery disease, the severity of this disease and any potential stenosis cannot be assessed on this non-gated CT examination. Assessment for potential risk factor  modification, dietary therapy or pharmacologic therapy may be warranted, if clinically indicated. 3. Diffuse bronchial wall thickening with severe centrilobular and paraseptal emphysema; imaging findings suggestive of underlying COPD. These results will be called to the ordering clinician or representative by the Radiologist Assistant, and communication documented in the PACS or zVision Dashboard. Electronically Signed   By: Vinnie Langton M.D.   On: 08/11/2015 16:43    ASSESSMENT & PLAN:   # LEFT UPPER LOBE NODULE- suspicious for malignancy- especially since it is growing compared to previous scan in April 2016. Current measurements are 10.5 mm. I reviewed the images myself ; reviewed the images with the patient. Awaiting a PET scan.   Discussed with patient- ideally we would like to get tissue diagnosis before proceeding with treatment. We'll review the imaging at the tumor conference tomorrow/ evaluate if patient can get the biopsy through bronchoscopy. Given his advanced COPD/ likely not a candidate for transthoracic biopsy. Question feasibility of bronchoscopic biopsy. Patient is not a surgical candidate as per Dr. Genevive Bi. Discussed with Dr. Faith Rogue.  # Smoking- counseling the patient regarding smoking. Patient not interested.  # Alcohol use.- Check CBC CMP today.  # Will also refer the patient to radiation oncology.   # Patient follow-up with me in approximately 10 days to review the next plan of care.   Thank you Dr.Oaks  for allowing me to participate in the care of your pleasant patient. Please do not hesitate to contact me with questions or concerns in the interim.     Cammie Sickle, MD 08/26/2015 4:53 PM

## 2015-08-27 ENCOUNTER — Ambulatory Visit
Admission: RE | Admit: 2015-08-27 | Discharge: 2015-08-27 | Disposition: A | Discharge status: Home / Self Care | Payer: Medicare Other | Source: Ambulatory Visit | Attending: Cardiothoracic Surgery | Admitting: Cardiothoracic Surgery

## 2015-08-27 DIAGNOSIS — R918 Other nonspecific abnormal finding of lung field: Secondary | ICD-10-CM | POA: Insufficient documentation

## 2015-08-27 DIAGNOSIS — K5732 Diverticulitis of large intestine without perforation or abscess without bleeding: Secondary | ICD-10-CM | POA: Diagnosis not present

## 2015-08-27 DIAGNOSIS — K8020 Calculus of gallbladder without cholecystitis without obstruction: Secondary | ICD-10-CM | POA: Diagnosis not present

## 2015-08-27 DIAGNOSIS — J439 Emphysema, unspecified: Secondary | ICD-10-CM | POA: Diagnosis not present

## 2015-08-27 LAB — GLUCOSE, CAPILLARY: GLUCOSE-CAPILLARY: 83 mg/dL (ref 65–99)

## 2015-08-27 MED ORDER — FLUDEOXYGLUCOSE F - 18 (FDG) INJECTION
11.9200 | Freq: Once | INTRAVENOUS | Status: AC | PRN
  Administered 2015-08-27: 11.92 via INTRAVENOUS

## 2015-09-01 ENCOUNTER — Encounter: Payer: Self-pay | Admitting: Internal Medicine

## 2015-09-01 ENCOUNTER — Ambulatory Visit (INDEPENDENT_AMBULATORY_CARE_PROVIDER_SITE_OTHER): Payer: Medicare Other | Admitting: Internal Medicine

## 2015-09-01 VITALS — BP 122/62 | HR 73 | Ht 68.0 in | Wt 158.0 lb

## 2015-09-01 DIAGNOSIS — J441 Chronic obstructive pulmonary disease with (acute) exacerbation: Secondary | ICD-10-CM | POA: Diagnosis not present

## 2015-09-01 DIAGNOSIS — Z720 Tobacco use: Secondary | ICD-10-CM

## 2015-09-01 DIAGNOSIS — R911 Solitary pulmonary nodule: Secondary | ICD-10-CM

## 2015-09-01 MED ORDER — AZITHROMYCIN 250 MG PO TABS: ORAL_TABLET | ORAL | Status: DC

## 2015-09-01 MED ORDER — PREDNISONE 10 MG PO TABS: ORAL_TABLET | ORAL | Status: DC

## 2015-09-01 NOTE — Assessment & Plan Note (Signed)
Symptoms c/w COPD exacerbation. Encouraged smoking cessation. Will start Prednisone taper and Azithromycin. Follow up recheck in 4 weeks and sooner as needed.

## 2015-09-01 NOTE — Assessment & Plan Note (Signed)
Encouraged smoking cessation. Discussed several options including nicotine replacement. He will look into this.

## 2015-09-01 NOTE — Assessment & Plan Note (Signed)
Reviewed CT and PET scan showing LUL lung nodule. We discussed that this is likely malignancy. He seems to have poor understanding of this, and reports it may be "infected tumor."  He is scheduled to start radiation oncology next week.

## 2015-09-01 NOTE — Progress Notes (Signed)
Subjective:    Patient ID: Peter Johnston, male    DOB: 1941/01/31, 75 y.o.   MRN: [id number]  HPI  75YO male presents for follow up.  Left upper lung nodule - PET scan showed hypermetabolic nodule, however he reports he was told they are not sure it is malignant. Planning to have radiation therapy. Felt he would not tolerate biopsy because of severe COPD. Has evaluation set up with radiation oncology.  COPD - Feeling short of breath all the time. Coughing up mucous, typically tan in color or greenish. Taking Prednisone '5mg'$  daily with no improvement. Thinking he needs an antibiotic or higher dose of Prednisone. Continues to smoke 1-2 packs per day.    Wt Readings from Last 3 Encounters:  09/01/15 158 lb (71.668 kg)  08/26/15 156 lb 8.4 oz (71 kg)  08/18/15 160 lb (72.576 kg)   BP Readings from Last 3 Encounters:  09/01/15 122/62  08/26/15 135/82  08/18/15 113/71    Past Medical History  Diagnosis Date  . COPD (chronic obstructive pulmonary disease) ([place])   . Arthritis   . Hypertension   . Lung mass   . IDA (iron deficiency anemia)    Family History  Problem Relation Age of Onset  . Stroke Father   . Heart disease Father   . Hypertension Father   . Heart disease Brother 23   Past Surgical History  Procedure Laterality Date  . Hammer toe surgery  2011, 2012    x 2- Dr. Elvina Mattes  . Abdominal hernia repair  1995    Dr. Rochel Brome   Social History   Social History  . Marital Status: Widowed    Spouse Name: N/A  . Number of Children: 2  . Years of Education: N/A   Occupational History  . Retired     Social History Main Topics  . Smoking status: Current Every Day Smoker -- 2.00 packs/day for 63 years    Types: Cigarettes  . Smokeless tobacco: Never Used  . Alcohol Use: 0.0 oz/week    0 Standard drinks or equivalent per week     Comment: 2-3 beers at night  . Drug Use: No  . Sexual Activity: Not Asked   Other Topics Concern  . None   Social History  Narrative   Lives in [place] with son.      2 children, 2 grandchildren             Review of Systems  Constitutional: Positive for fatigue. Negative for fever, chills, activity change, appetite change and unexpected weight change.  HENT: Negative for congestion, postnasal drip, rhinorrhea and sinus pressure.   Eyes: Negative for visual disturbance.  Respiratory: Positive for cough, chest tightness, shortness of breath and wheezing.   Cardiovascular: Negative for chest pain, palpitations and leg swelling.  Gastrointestinal: Negative for abdominal pain and abdominal distention.  Genitourinary: Negative for dysuria, urgency and difficulty urinating.  Musculoskeletal: Negative for arthralgias and gait problem.  Skin: Negative for color change and rash.  Hematological: Negative for adenopathy.  Psychiatric/Behavioral: Negative for sleep disturbance and dysphoric mood. The patient is not nervous/anxious.        Objective:    BP 122/62 mmHg  Pulse 73  Ht '5\' 8"'$  (1.727 m)  Wt 158 lb (71.668 kg)  BMI 24.03 kg/m2  SpO2 89% Physical Exam  Constitutional: He is oriented to person, place, and time. He appears well-developed and well-nourished. No distress.  HENT:  Head: Normocephalic and atraumatic.  Right  Ear: External ear normal.  Left Ear: External ear normal.  Nose: Nose normal.  Mouth/Throat: Oropharynx is clear and moist. No oropharyngeal exudate.  Eyes: Conjunctivae and EOM are normal. Pupils are equal, round, and reactive to light. Right eye exhibits no discharge. Left eye exhibits no discharge. No scleral icterus.  Neck: Normal range of motion. Neck supple. No tracheal deviation present. No thyromegaly present.  Cardiovascular: Normal rate, regular rhythm and normal heart sounds.  Exam reveals no gallop and no friction rub.   No murmur heard. Pulmonary/Chest: Accessory muscle usage present. Tachypnea noted. No respiratory distress. He has decreased breath sounds. He has  wheezes. He has no rhonchi. He has no rales. He exhibits no tenderness.  Musculoskeletal: Normal range of motion. He exhibits no edema.  Lymphadenopathy:    He has no cervical adenopathy.  Neurological: He is alert and oriented to person, place, and time. No cranial nerve deficit. Coordination normal.  Skin: Skin is warm and dry. No rash noted. He is not diaphoretic. No erythema. No pallor.  Psychiatric: He has a normal mood and affect. His behavior is normal. Judgment and thought content normal.          Assessment & Plan:   Problem List Items Addressed This Visit      Unprioritized   COPD exacerbation ([place])    Symptoms c/w COPD exacerbation. Encouraged smoking cessation. Will start Prednisone taper and Azithromycin. Follow up recheck in 4 weeks and sooner as needed.      Relevant Medications   predniSONE (DELTASONE) 10 MG tablet   azithromycin (ZITHROMAX Z-PAK) 250 MG tablet   Lung nodule - Primary    Reviewed CT and PET scan showing LUL lung nodule. We discussed that this is likely malignancy. He seems to have poor understanding of this, and reports it may be "infected tumor."  He is scheduled to start radiation oncology next week.      Tobacco abuse    Encouraged smoking cessation. Discussed several options including nicotine replacement. He will look into this.          Return in about 4 weeks (around 09/29/2015) for Recheck.  Ronette Deter, MD Internal Medicine [facility]

## 2015-09-01 NOTE — Patient Instructions (Signed)
Start Prednisone taper.  Start Azithromycin.  Continue other medications.  Follow up in 4 weeks and sooner as needed.

## 2015-09-07 ENCOUNTER — Inpatient Hospital Stay: Payer: Medicare Other | Attending: Internal Medicine | Admitting: Internal Medicine

## 2015-09-07 ENCOUNTER — Ambulatory Visit
Admission: RE | Admit: 2015-09-07 | Discharge: 2015-09-07 | Disposition: A | Discharge status: Home / Self Care | Payer: Medicare Other | Source: Ambulatory Visit | Attending: Radiation Oncology | Admitting: Radiation Oncology

## 2015-09-07 ENCOUNTER — Encounter: Payer: Self-pay | Admitting: Radiation Oncology

## 2015-09-07 VITALS — BP 134/68 | HR 71 | Temp 97.8°F | Resp 18 | Wt 156.5 lb

## 2015-09-07 DIAGNOSIS — Z510 Encounter for antineoplastic radiation therapy: Secondary | ICD-10-CM | POA: Insufficient documentation

## 2015-09-07 DIAGNOSIS — R911 Solitary pulmonary nodule: Secondary | ICD-10-CM | POA: Diagnosis not present

## 2015-09-07 DIAGNOSIS — F17210 Nicotine dependence, cigarettes, uncomplicated: Secondary | ICD-10-CM | POA: Insufficient documentation

## 2015-09-07 DIAGNOSIS — I10 Essential (primary) hypertension: Secondary | ICD-10-CM

## 2015-09-07 DIAGNOSIS — Z7952 Long term (current) use of systemic steroids: Secondary | ICD-10-CM | POA: Insufficient documentation

## 2015-09-07 DIAGNOSIS — M129 Arthropathy, unspecified: Secondary | ICD-10-CM | POA: Insufficient documentation

## 2015-09-07 DIAGNOSIS — Z79899 Other long term (current) drug therapy: Secondary | ICD-10-CM | POA: Insufficient documentation

## 2015-09-07 DIAGNOSIS — Z9981 Dependence on supplemental oxygen: Secondary | ICD-10-CM | POA: Diagnosis not present

## 2015-09-07 DIAGNOSIS — D509 Iron deficiency anemia, unspecified: Secondary | ICD-10-CM | POA: Diagnosis not present

## 2015-09-07 DIAGNOSIS — K5732 Diverticulitis of large intestine without perforation or abscess without bleeding: Secondary | ICD-10-CM

## 2015-09-07 DIAGNOSIS — R918 Other nonspecific abnormal finding of lung field: Secondary | ICD-10-CM

## 2015-09-07 DIAGNOSIS — K8020 Calculus of gallbladder without cholecystitis without obstruction: Secondary | ICD-10-CM

## 2015-09-07 DIAGNOSIS — Z7951 Long term (current) use of inhaled steroids: Secondary | ICD-10-CM | POA: Insufficient documentation

## 2015-09-07 DIAGNOSIS — K5790 Diverticulosis of intestine, part unspecified, without perforation or abscess without bleeding: Secondary | ICD-10-CM

## 2015-09-07 DIAGNOSIS — I2510 Atherosclerotic heart disease of native coronary artery without angina pectoris: Secondary | ICD-10-CM

## 2015-09-07 DIAGNOSIS — R0602 Shortness of breath: Secondary | ICD-10-CM | POA: Diagnosis not present

## 2015-09-07 DIAGNOSIS — J449 Chronic obstructive pulmonary disease, unspecified: Secondary | ICD-10-CM | POA: Insufficient documentation

## 2015-09-07 DIAGNOSIS — C3412 Malignant neoplasm of upper lobe, left bronchus or lung: Secondary | ICD-10-CM | POA: Insufficient documentation

## 2015-09-07 DIAGNOSIS — K8080 Other cholelithiasis without obstruction: Secondary | ICD-10-CM | POA: Diagnosis not present

## 2015-09-07 NOTE — Consult Note (Signed)
Except an outstanding is perfect of Radiation Oncology NEW PATIENT EVALUATION  Name: Peter Johnston  MRN: [id number]  Date:   09/07/2015     DOB: [birth year]   This 75 y.o. male patient presents to the clinic for initial evaluation of stage I non-small cell lung cancer of left upper lobe.  REFERRING PHYSICIAN: Jackolyn Confer, MD  CHIEF COMPLAINT: No chief complaint on file.   DIAGNOSIS: The encounter diagnosis was Lung mass.   PREVIOUS INVESTIGATIONS:  CT scan and PET CT scans reviewed Clinical notes reviewed Case presented at tumor conference  HPI: Patient is a 75 year old male oxygen dependent secondary to long-standing smoking history and significant COPD. He had lung cancer screening which picked up a 7 mm nodule in the left upper lobe back in April 2016. His increased 30% to 10 mm most recently in May 2017. He was seen by Dr. Faith Rogue not thought to be a surgical candidate based on his significant comorbidities and COPD. He does have a nonproductive cough. Otherwise he is asymptomatic. Case was presented at our weekly tumor conference based on PET/CT positivity progression of lesion this was felt to be certainly a non-small cell lung cancer. Radiology has declined biopsy based on chance of significant more comorbidity possibly dropping the lung. I have been asked to evaluate the patient for consideration of SB RT  PLANNED TREATMENT REGIMEN: SB RT  PAST MEDICAL HISTORY:  has a past medical history of COPD (chronic obstructive pulmonary disease) ([place]); Arthritis; Hypertension; Lung mass; and IDA (iron deficiency anemia).    PAST SURGICAL HISTORY:  Past Surgical History  Procedure Laterality Date  . Hammer toe surgery  2011, 2012    x 2- Dr. Elvina Mattes  . Abdominal hernia repair  1995    Dr. Rochel Brome    FAMILY HISTORY: family history includes Heart disease in his father; Heart disease (age of onset: 61) in his brother; Hypertension in his father; Stroke in his father.  SOCIAL  HISTORY:  reports that he has been smoking Cigarettes.  He has a 126 pack-year smoking history. He has never used smokeless tobacco. He reports that he drinks alcohol. He reports that he does not use illicit drugs.  ALLERGIES: Tamsulosin; Amoxicillin-pot clavulanate; Gold-containing drug products; Hydrochlorothiazide; and Sulfa antibiotics  MEDICATIONS:  Current Outpatient Prescriptions  Medication Sig Dispense Refill  . allopurinol (ZYLOPRIM) 300 MG tablet Take 300 mg by mouth daily.    Marland Kitchen atenolol (TENORMIN) 25 MG tablet TAKE 1 TABLET DAILY (Patient taking differently: TAKE 1 /2 TABLET DAILY) 90 tablet 3  . ENBREL 50 MG/ML injection 50 mg once a week.     . fenofibrate (TRICOR) 145 MG tablet TAKE 1 TABLET DAILY 90 tablet 1  . Fluticasone Furoate-Vilanterol (BREO ELLIPTA) 100-25 MCG/INH AEPB Inhale 1 puff into the lungs daily. 60 each 6  . hydroxychloroquine (PLAQUENIL) 200 MG tablet Take 200 mg by mouth daily.    Marland Kitchen ipratropium-albuterol (DUONEB) 0.5-2.5 (3) MG/3ML SOLN Inhale 3 mLs into the lungs every 4 (four) hours as needed. 90 mL 3  . omeprazole (PRILOSEC) 40 MG capsule TAKE 1 CAPSULE DAILY 90 capsule 3  . predniSONE (DELTASONE) 10 MG tablet Take '60mg'$  by mouth on day 1, then taper by '10mg'$  daily until gone 21 tablet 0  . predniSONE (DELTASONE) 5 MG tablet Take 5 mg by mouth daily.     . theophylline (THEO-24) 100 MG 24 hr capsule Take 50 mg by mouth daily.     . traMADol (ULTRAM) 50 MG  tablet TAKE ONE TABLET BY MOUTH EVERY 12 HOURS AS NEEDED 60 tablet 0   No current facility-administered medications for this encounter.    ECOG PERFORMANCE STATUS:  0 - Asymptomatic  REVIEW OF SYSTEMS: Except for the nasal oxygen dependency significant COPD shortness of breath and dyspnea on exertion Patient denies any weight loss, fatigue, weakness, fever, chills or night sweats. Patient denies any loss of vision, blurred vision. Patient denies any ringing  of the ears or hearing loss. No irregular  heartbeat. Patient denies heart murmur or history of fainting. Patient denies any chest pain or pain radiating to her upper extremities. Patient denies any shortness of breath, difficulty breathing at night, cough or hemoptysis. Patient denies any swelling in the lower legs. Patient denies any nausea vomiting, vomiting of blood, or coffee ground material in the vomitus. Patient denies any stomach pain. Patient states has had normal bowel movements no significant constipation or diarrhea. Patient denies any dysuria, hematuria or significant nocturia. Patient denies any problems walking, swelling in the joints or loss of balance. Patient denies any skin changes, loss of hair or loss of weight. Patient denies any excessive worrying or anxiety or significant depression. Patient denies any problems with insomnia. Patient denies excessive thirst, polyuria, polydipsia. Patient denies any swollen glands, patient denies easy bruising or easy bleeding. Patient denies any recent infections, allergies or URI. Patient "s visual fields have not changed significantly in recent time.    PHYSICAL EXAM: There were no vitals taken for this visit. Wheelchair-bound male in NAD on continuous nasal oxygen Well-developed well-nourished patient in NAD. HEENT reveals PERLA, EOMI, discs not visualized.  Oral cavity is clear. No oral mucosal lesions are identified. Neck is clear without evidence of cervical or supraclavicular adenopathy. Lungs are clear to A&P. Cardiac examination is essentially unremarkable with regular rate and rhythm without murmur rub or thrill. Abdomen is benign with no organomegaly or masses noted. Motor sensory and DTR levels are equal and symmetric in the upper and lower extremities. Cranial nerves II through XII are grossly intact. Proprioception is intact. No peripheral adenopathy or edema is identified. No motor or sensory levels are noted. Crude visual fields are within normal range.  LABORATORY DATA: No  current path report for review    RADIOLOGY RESULTS: CT scans PET/CT scans and serial scans reviewed compatible with the above-stated findings   IMPRESSION: At this time we are all in agreement tumor conference this is a non-small cell lung cancer with progression of disease by CT criteria, hypermetabolic activity on PET CT scan in a patient unable to be biopsied secondary to significant chance of comorbidity from needlestick.  PLAN: At this time I to go ahead with SB RT. I will plan on delivering 5000 cGy over 5 treatments to his progressive mass. Risks and benefits of treatment including possible chance of increased cough fatigue possible skin reaction possible radiation esophagitis all were discussed in detail with the patient. He seems to comprehend my treatment plan well. I firstly set up and ordered CT simulation for later this week. We'll use respiratory motion evaluation doing simulation. Patient seems to comprehend our treatment plan well.  I would like to take this opportunity to thank you for allowing me to participate in the care of your patient.Armstead Peaks., MD

## 2015-09-07 NOTE — Progress Notes (Signed)
[place] NOTE  Patient Care Team: Jackolyn Confer, MD as PCP - General (Internal Medicine) Allyne Gee, MD (Pulmonary Disease)  CHIEF COMPLAINTS/PURPOSE OF CONSULTATION:   # April 2016- LUL LUNG NODULE [100m]; MAY 2017- 116m PET avid- no distant mets  HISTORY OF PRESENTING ILLNESS:  Peter ALDANA89.o.  male long-standing history of smoking who unfortunately continues to smoke; also history of COPD on 2-3 L of O2 is here to discuss his options for his left upper lobe lung nodule suspected to be malignant/review the results of the PET scan.  Patient continues to have chronic shortness of breath; on home O2. No new cough no hemoptysis. Denies any weight loss. Denies any headaches. No chest pain.  Unusual weight loss. Appetite is fair. He denies any pain.  ROS: A complete 10 point review of system is done which is negative except mentioned above in history of present illness  MEDICAL HISTORY:  Past Medical History  Diagnosis Date  . [place]  . Arthritis   . Hypertension   . Lung mass   . IDA (iron deficiency anemia)     SURGICAL HISTORY: Past Surgical History  Procedure Laterality Date  . Hammer toe surgery  2011, 2012    x 2- Dr. TrElvina Mattes. Abdominal hernia repair  1995    Dr. WiRochel Brome  SOCIAL HISTORY: retired with [employer]; [place]; with girl friend; and son.  Social History   Social History  . Marital Status: Widowed    Spouse Name: N/A  . Number of Children: 2  . Years of Education: N/A   Occupational History  . Retired     Social History Main Topics  . Smoking status: Current Every Day Smoker -- 2.00 packs/day for 63 years    Types: Cigarettes  . Smokeless tobacco: Never Used  . Alcohol Use: 0.0 oz/week    0 Standard drinks or equivalent per week     Comment: 2-3 beers at night  . Drug Use: No  . Sexual Activity: Not on file   Other Topics Concern  . Not on file    Social History Narrative   [place]      2 children, 2 grandchildren             FAMILY HISTORY: Family History  Problem Relation Age of Onset  . Stroke Father   . Heart disease Father   . Hypertension Father   . Heart disease Brother [id number]  ALLERGIES:  is allergic to tamsulosin; amoxicillin-pot clavulanate; gold-containing drug products; hydrochlorothiazide; and sulfa antibiotics.  MEDICATIONS:  Current Outpatient Prescriptions  Medication Sig Dispense Refill  . allopurinol (ZYLOPRIM) 300 MG tablet Take 300 mg by mouth daily.    . Marland Kitchentenolol (TENORMIN) 25 MG tablet TAKE 1 TABLET DAILY (Patient taking differently: TAKE 1 /2 TABLET DAILY) 90 tablet 3  . ENBREL 50 MG/ML injection 50 mg once a week.     . fenofibrate (TRICOR) 145 MG tablet TAKE 1 TABLET DAILY 90 tablet 1  . Fluticasone Furoate-Vilanterol (BREO ELLIPTA) 100-25 MCG/INH AEPB Inhale 1 puff into the lungs daily. 60 each 6  . hydroxychloroquine (PLAQUENIL) 200 MG tablet Take 200 mg by mouth daily.    . Marland Kitchenpratropium-albuterol (DUONEB) 0.5-2.5 (3) MG/3ML SOLN Inhale 3 mLs into the lungs every 4 (four) hours as needed. 90 mL 3  . omeprazole (PRILOSEC) 40 MG capsule TAKE 1  CAPSULE DAILY 90 capsule 3  . predniSONE (DELTASONE) 10 MG tablet Take '60mg'$  by mouth on day 1, then taper by '10mg'$  daily until gone 21 tablet 0  . predniSONE (DELTASONE) 5 MG tablet Take 5 mg by mouth daily.     . theophylline (THEO-24) 100 MG 24 hr capsule Take 50 mg by mouth daily.     . traMADol (ULTRAM) 50 MG tablet TAKE ONE TABLET BY MOUTH EVERY 12 HOURS AS NEEDED 60 tablet 0   No current facility-administered medications for this visit.      Marland Kitchen  PHYSICAL EXAMINATION: ECOG PERFORMANCE STATUS: 2 - Symptomatic, <50% confined to bed  Filed Vitals:   09/07/15 1058  BP: 134/68  Pulse: 71  Temp: 97.8 F (36.6 C)  Resp: 18   Filed Weights   09/07/15 1058  Weight: 156 lb 8.4 oz (71 kg)    GENERAL: Well-nourished  well-developed; Alert, no distress and comfortable.  He is alone. He is wearing oxygen. He is walking himself. EYES: no pallor or icterus OROPHARYNX: no thrush or ulceration.  NECK: supple, no masses felt LYMPH:  no palpable lymphadenopathy in the cervical, axillary or inguinal regions LUNGS: Bilateral decreased air entry ; no wheeze or crackles HEART/CVS: regular rate & rhythm and no murmurs; No lower extremity edema ABDOMEN: abdomen soft, non-tender and normal bowel sounds Musculoskeletal:no cyanosis of digits and no clubbing  PSYCH: alert & oriented x 3 with fluent speech NEURO: no focal motor/sensory deficits SKIN:  no rashes or significant lesions  LABORATORY DATA:  I have reviewed the data as listed Lab Results  Component Value Date   WBC 11.0* 08/26/2015   HGB 12.1* 08/26/2015   HCT 37.8* 08/26/2015   MCV 83.1 08/26/2015   PLT 288 08/26/2015    Recent Labs  12/22/14 1358 08/26/15 1230  NA 138 136  K 3.9 3.9  CL 101 100*  CO2 28 30  GLUCOSE 108* 99  BUN 25* 22*  CREATININE 1.04 1.16  CALCIUM 9.3 9.1  GFRNONAA  --  59*  GFRAA  --  >60  PROT 7.4 7.9  ALBUMIN 3.7 3.9  AST 24 29  ALT 17 16*  ALKPHOS 38* 43  BILITOT 0.3 0.5    RADIOGRAPHIC STUDIES: I have personally reviewed the radiological images as listed and agreed with the findings in the report. Nm Pet Image Initial (pi) Skull Base To Thigh  08/27/2015  CLINICAL DATA:  Initial Treatment strategy for solitary pulmonary nodule. EXAM: NUCLEAR MEDICINE PET SKULL BASE TO THIGH TECHNIQUE: 11.9 mCi F-18 FDG was injected intravenously. Full-ring PET imaging was performed from the skull base to thigh after the radiotracer. CT data was obtained and used for attenuation correction and anatomic localization. FASTING BLOOD GLUCOSE:  Value: 83 mg/dl COMPARISON:  08/11/2015 FINDINGS: NECK No hypermetabolic adenopathy in the neck. CHEST The spiculated 1.2 by 1.1 cm left upper lobe nodule has a maximum standard uptake value of  3.3, compatible with malignancy. No enlarged or hypermetabolic lymph nodes in the chest are observed. Markedly severe emphysema. Large bulla in the upper lobes. ABDOMEN/PELVIS Prominent hypermetabolic activity in the sigmoid colon and rectum. There is sigmoid, colon diverticulosis and accentuation of stranding in the sigmoid mesentery compatible with mild diverticulitis. Small gallstones are present in the gallbladder. SKELETON High activity in the left glenohumeral joint compatible with arthropathy. IMPRESSION: 1. The spiculated left upper lobe nodule is hypermetabolic with maximum SUV 3.3, compatible with malignancy. No associated thoracic adenopathy identified. 2. Markedly severe emphysema. 3. Mild  sigmoid colon diverticulitis, with high activity throughout the sigmoid colon and rectum which may be inflammatory given the diverticulitis. Please note that malignancy involving the sigmoid colon is not completely excluded and given the very high underlying activity, might be masked. This may merit colonoscopy after resolution of any symptoms. 4. Cholelithiasis. Electronically Signed   By: Van Clines M.D.   On: 08/27/2015 14:41   Ct Chest Lung Ca Screen Low Dose W/o Cm  08/11/2015  CLINICAL DATA:  75 year old male current smoker with 100 pack-year history of smoking. Lung cancer screening examination. EXAM: CT CHEST WITHOUT CONTRAST LOW-DOSE FOR LUNG CANCER SCREENING TECHNIQUE: Multidetector CT imaging of the chest was performed following the standard protocol without IV contrast. COMPARISON:  Low-dose lung cancer screening chest CT 07/18/2014. FINDINGS: Mediastinum/Nodes: Heart size is normal. There is no significant pericardial fluid, thickening or pericardial calcification. There is atherosclerosis of the thoracic aorta, the great vessels of the mediastinum and the coronary arteries, including calcified atherosclerotic plaque in the left anterior descending, left circumflex and right coronary arteries.  No pathologically enlarged mediastinal or hilar lymph nodes. Please note that accurate exclusion of hilar adenopathy is limited on noncontrast CT scans. Esophagus is unremarkable in appearance. No axillary lymphadenopathy. Lungs/Pleura: Compared to the prior study, one of the previously noted nodules are stable, one has resolved, and one has clearly increased. The concerning nodule in the left upper lobe (image 137 of series 3) currently has a volume derived mean diameter of 10.5 mm (previously mean diameter of 7.4 mm on 07/18/2014) and is macrolobulated with spiculated margins, highly concerning for a small bronchogenic carcinoma. Progressive scarring in the lingula. Diffuse bronchial wall thickening with severe centrilobular and paraseptal emphysema, including extensive bullous disease throughout the mid to upper lungs bilaterally. No acute consolidative airspace disease. No pleural effusions. Upper abdomen: Unremarkable. Musculoskeletal: There are no aggressive appearing lytic or blastic lesions noted in the visualized portions of the skeleton. IMPRESSION: 1. Lung-RADS Category 4BS, suspicious. Additional imaging evaluation or consultation with pulmonary medicine or thoracic surgery recommended. 2. The "S" modifier above refers to potentially clinically significant non lung cancer related findings. Specifically, there is extensive atherosclerosis, including three-vessel coronary artery disease. Please note that although the presence of coronary artery calcium documents the presence of coronary artery disease, the severity of this disease and any potential stenosis cannot be assessed on this non-gated CT examination. Assessment for potential risk factor modification, dietary therapy or pharmacologic therapy may be warranted, if clinically indicated. 3. Diffuse bronchial wall thickening with severe centrilobular and paraseptal emphysema; imaging findings suggestive of underlying COPD. These results will be called  to the ordering clinician or representative by the Radiologist Assistant, and communication documented in the PACS or zVision Dashboard. Electronically Signed   By: Vinnie Langton M.D.   On: 08/11/2015 16:43    ASSESSMENT & PLAN:   # LEFT UPPER LOBE NODULE- suspicious for malignancy- especially since it is growing compared to previous scan in April 2016. Current measurements are 10.5 mm. PET scan shows solitary left upper lobe lesion SUV 5 compatible with malignancy.  I reviewed the images myself ; reviewed the images with the patient.   # Reviewed at the tumor conference based on imaging/highly characteristic for malignancy. Patient is a poor candidate for biopsy or resection. Recommendation for radiation. Spoke to Dr. Donella Stade he will evaluated the patient today.  # I also updated patient's pulmonologist Dr. Laurelyn Sickle re: the above plan.   # Patient will continue follow-up with me  in 2 months.     Cammie Sickle, MD 09/07/2015 11:10 AM

## 2015-09-10 ENCOUNTER — Ambulatory Visit
Admission: RE | Admit: 2015-09-10 | Discharge: 2015-09-10 | Disposition: A | Discharge status: Home / Self Care | Payer: Medicare Other | Source: Ambulatory Visit | Attending: Radiation Oncology | Admitting: Radiation Oncology

## 2015-09-10 DIAGNOSIS — J449 Chronic obstructive pulmonary disease, unspecified: Secondary | ICD-10-CM | POA: Diagnosis not present

## 2015-09-10 DIAGNOSIS — I10 Essential (primary) hypertension: Secondary | ICD-10-CM | POA: Diagnosis not present

## 2015-09-10 DIAGNOSIS — M129 Arthropathy, unspecified: Secondary | ICD-10-CM | POA: Diagnosis not present

## 2015-09-10 DIAGNOSIS — Z7952 Long term (current) use of systemic steroids: Secondary | ICD-10-CM | POA: Diagnosis not present

## 2015-09-10 DIAGNOSIS — C3412 Malignant neoplasm of upper lobe, left bronchus or lung: Secondary | ICD-10-CM | POA: Diagnosis not present

## 2015-09-10 DIAGNOSIS — Z9981 Dependence on supplemental oxygen: Secondary | ICD-10-CM | POA: Diagnosis not present

## 2015-09-10 DIAGNOSIS — Z79899 Other long term (current) drug therapy: Secondary | ICD-10-CM | POA: Diagnosis not present

## 2015-09-10 DIAGNOSIS — F17210 Nicotine dependence, cigarettes, uncomplicated: Secondary | ICD-10-CM | POA: Diagnosis not present

## 2015-09-10 DIAGNOSIS — Z510 Encounter for antineoplastic radiation therapy: Secondary | ICD-10-CM | POA: Diagnosis not present

## 2015-09-10 DIAGNOSIS — Z7951 Long term (current) use of inhaled steroids: Secondary | ICD-10-CM | POA: Diagnosis not present

## 2015-09-10 DIAGNOSIS — D509 Iron deficiency anemia, unspecified: Secondary | ICD-10-CM | POA: Diagnosis not present

## 2015-09-16 ENCOUNTER — Ambulatory Visit (INDEPENDENT_AMBULATORY_CARE_PROVIDER_SITE_OTHER): Payer: Medicare Other

## 2015-09-16 VITALS — BP 128/72 | HR 71 | Temp 98.2°F | Resp 20 | Ht 66.5 in | Wt 157.0 lb

## 2015-09-16 DIAGNOSIS — Z0000 Encounter for general adult medical examination without abnormal findings: Secondary | ICD-10-CM

## 2015-09-16 NOTE — Progress Notes (Signed)
Care was provided under my supervision. I agree with the management as indicated in the note.  Blima Jaimes DO  

## 2015-09-16 NOTE — Patient Instructions (Addendum)
Peter Johnston , Thank you for taking time to come for your Medicare Wellness Visit. I appreciate your ongoing commitment to your health goals. Please review the following plan we discussed and let me know if I can assist you in the future.   Follow up with Dr. Gilford Rile as needed.   This is a list of the screening recommended for you and due dates:  Health Maintenance  Topic Date Due  . Shingles Vaccine  03/06/1999  . Flu Shot  10/27/2015  . Tetanus Vaccine  03/28/2020  . Pneumonia vaccines  Completed    Fall Prevention in the Home  Falls can cause injuries. They can happen to people of all ages. There are many things you can do to make your home safe and to help prevent falls.  WHAT CAN I DO ON THE OUTSIDE OF MY HOME?  Regularly fix the edges of walkways and driveways and fix any cracks.  Remove anything that might make you trip as you walk through a door, such as a raised step or threshold.  Trim any bushes or trees on the path to your home.  Use bright outdoor lighting.  Clear any walking paths of anything that might make someone trip, such as rocks or tools.  Regularly check to see if handrails are loose or broken. Make sure that both sides of any steps have handrails.  Any raised decks and porches should have guardrails on the edges.  Have any leaves, snow, or ice cleared regularly.  Use sand or salt on walking paths during winter.  Clean up any spills in your garage right away. This includes oil or grease spills. WHAT CAN I DO IN THE BATHROOM?   Use night lights.  Install grab bars by the toilet and in the tub and shower. Do not use towel bars as grab bars.  Use non-skid mats or decals in the tub or shower.  If you need to sit down in the shower, use a plastic, non-slip stool.  Keep the floor dry. Clean up any water that spills on the floor as soon as it happens.  Remove soap buildup in the tub or shower regularly.  Attach bath mats securely with double-sided  non-slip rug tape.  Do not have throw rugs and other things on the floor that can make you trip. WHAT CAN I DO IN THE BEDROOM?  Use night lights.  Make sure that you have a light by your bed that is easy to reach.  Do not use any sheets or blankets that are too big for your bed. They should not hang down onto the floor.  Have a firm chair that has side arms. You can use this for support while you get dressed.  Do not have throw rugs and other things on the floor that can make you trip. WHAT CAN I DO IN THE KITCHEN?  Clean up any spills right away.  Avoid walking on wet floors.  Keep items that you use a lot in easy-to-reach places.  If you need to reach something above you, use a strong step stool that has a grab bar.  Keep electrical cords out of the way.  Do not use floor polish or wax that makes floors slippery. If you must use wax, use non-skid floor wax.  Do not have throw rugs and other things on the floor that can make you trip. WHAT CAN I DO WITH MY STAIRS?  Do not leave any items on the stairs.  Make sure  that there are handrails on both sides of the stairs and use them. Fix handrails that are broken or loose. Make sure that handrails are as long as the stairways.  Check any carpeting to make sure that it is firmly attached to the stairs. Fix any carpet that is loose or worn.  Avoid having throw rugs at the top or bottom of the stairs. If you do have throw rugs, attach them to the floor with carpet tape.  Make sure that you have a light switch at the top of the stairs and the bottom of the stairs. If you do not have them, ask someone to add them for you. WHAT ELSE CAN I DO TO HELP PREVENT FALLS?  Wear shoes that:  Do not have high heels.  Have rubber bottoms.  Are comfortable and fit you well.  Are closed at the toe. Do not wear sandals.  If you use a stepladder:  Make sure that it is fully opened. Do not climb a closed stepladder.  Make sure that both  sides of the stepladder are locked into place.  Ask someone to hold it for you, if possible.  Clearly mark and make sure that you can see:  Any grab bars or handrails.  First and last steps.  Where the edge of each step is.  Use tools that help you move around (mobility aids) if they are needed. These include:  Canes.  Walkers.  Scooters.  Crutches.  Turn on the lights when you go into a dark area. Replace any light bulbs as soon as they burn out.  Set up your furniture so you have a clear path. Avoid moving your furniture around.  If any of your floors are uneven, fix them.  If there are any pets around you, be aware of where they are.  Review your medicines with your doctor. Some medicines can make you feel dizzy. This can increase your chance of falling. Ask your doctor what other things that you can do to help prevent falls.   This information is not intended to replace advice given to you by your health care provider. Make sure you discuss any questions you have with your health care provider.   Document Released: 01/08/2009 Document Revised: 07/29/2014 Document Reviewed: 04/18/2014 Elsevier Interactive Patient Education [employer].

## 2015-09-16 NOTE — Progress Notes (Signed)
Subjective:   Peter Johnston is a 75 y.o. male who presents for Medicare Annual/Subsequent preventive examination.  Review of Systems:  No ROS.  Medicare Wellness Visit.  Cardiac Risk Factors include: hypertension     Objective:    Vitals: BP 128/72 mmHg  Pulse 71  Temp(Src) 98.2 F (36.8 C) (Oral)  Resp 20  Ht 5' 6.5" (1.689 m)  Wt 157 lb (71.215 kg)  BMI 24.96 kg/m2  SpO2 91%  Body mass index is 24.96 kg/(m^2).  Tobacco History  Smoking status  . Former Smoker -- 2.00 packs/day for 63 years  . Types: Cigarettes  . Quit date: 09/13/2015  Smokeless tobacco  . Never Used    Comment: Stopped smoking cigarettes Sunday and uses the vapor e-cig to help with cravings     Counseling given: Not Answered   Past Medical History  Diagnosis Date  . COPD (chronic obstructive pulmonary disease) ([place])   . Arthritis   . Hypertension   . Lung mass   . IDA (iron deficiency anemia)    Past Surgical History  Procedure Laterality Date  . Hammer toe surgery  2011, 2012    x 2- Dr. Elvina Mattes  . Abdominal hernia repair  1995    Dr. Rochel Brome   Family History  Problem Relation Age of Onset  . Stroke Father   . Heart disease Father   . Hypertension Father   . Heart disease Brother 69   History  Sexual Activity  . Sexual Activity: No    Outpatient Encounter Prescriptions as of 09/16/2015  Medication Sig  . allopurinol (ZYLOPRIM) 300 MG tablet Take 300 mg by mouth daily.  Marland Kitchen atenolol (TENORMIN) 25 MG tablet TAKE 1 TABLET DAILY (Patient taking differently: TAKE 1 /2 TABLET DAILY)  . ENBREL 50 MG/ML injection 50 mg once a week.   . fenofibrate (TRICOR) 145 MG tablet TAKE 1 TABLET DAILY  . Fluticasone Furoate-Vilanterol (BREO ELLIPTA) 100-25 MCG/INH AEPB Inhale 1 puff into the lungs daily.  . hydroxychloroquine (PLAQUENIL) 200 MG tablet Take 200 mg by mouth daily.  Marland Kitchen ipratropium-albuterol (DUONEB) 0.5-2.5 (3) MG/3ML SOLN Inhale 3 mLs into the lungs every 4 (four) hours as  needed.  Marland Kitchen omeprazole (PRILOSEC) 40 MG capsule TAKE 1 CAPSULE DAILY  . predniSONE (DELTASONE) 10 MG tablet Take '60mg'$  by mouth on day 1, then taper by '10mg'$  daily until gone  . predniSONE (DELTASONE) 5 MG tablet Take 5 mg by mouth daily.   . theophylline (THEO-24) 100 MG 24 hr capsule Take 50 mg by mouth daily.   . traMADol (ULTRAM) 50 MG tablet TAKE ONE TABLET BY MOUTH EVERY 12 HOURS AS NEEDED   No facility-administered encounter medications on file as of 09/16/2015.    Activities of Daily Living In your present state of health, do you have any difficulty performing the following activities: 09/16/2015  Hearing? N  Vision? N  Difficulty concentrating or making decisions? N  Walking or climbing stairs? Y  Dressing or bathing? N  Doing errands, shopping? N  Preparing Food and eating ? N  Using the Toilet? N  In the past six months, have you accidently leaked urine? N  Do you have problems with loss of bowel control? Y  Managing your Medications? N  Managing your Finances? N  Housekeeping or managing your Housekeeping? Y    Patient Care Team: Jackolyn Confer, MD as PCP - General (Internal Medicine) Allyne Gee, MD (Pulmonary Disease)   Assessment:   This  is a routine wellness examination for [name]. The goal of the wellness visit is to assist the patient how to close the gaps in care and create a preventative care plan for the patient.   Osteoporosis risk reviewed.  Medications reviewed; taking without issues or barriers.  Safety issues reviewed; smoke and carbon monoxide detectors in the home. Firearms locked in a secure area in the home. Wears seatbelts when driving or riding with others. No violence in the home.  No identified risk were noted; The patient was oriented x 3; appropriate in dress and manner and no objective failures at ADL's or IADL's.   Arterial disease NEC-stable and followed by PCP.   Health maintenance gaps; closed.  Patient Concerns: C/O  bilateral leg cramps at night; takes Aleve to help.  Reports ankles swelling x2 weeks; no edema, no pooling, no skin breakdown.  Walker/cane not in use; encouraged to use daily. Deferred to PCP for follow up.  Exercise Activities and Dietary recommendations Current Exercise Habits: The patient does not participate in regular exercise at present  Goals    . [place] Lifestyle     STAY HYDRATED AND DRINK PLENTY OF FLUIDS.  INCREASE WATER INTAKE. LOW CARB FOODS. VEGETABLES AND LEAN MEATS. STAY ACTIVE AND START CHAIR EXERCISES AS DEMONSTRATED. INCREASE MOVEMENTS AS TOLERATED.      Fall Risk Fall Risk  09/16/2015 09/01/2015 06/19/2014 11/14/2012  Falls in the past year? Yes Yes Yes No  Number falls in past yr: 2 or more 1 2 or more -  Injury with Fall? Yes Yes - -  Risk Factor Category  High Fall Risk High Fall Risk - -  Risk for fall due to : Impaired balance/gait - - History of fall(s)  Follow up Falls prevention discussed;Education provided - - -   Depression Screen PHQ 2/9 Scores 09/16/2015 09/01/2015 06/19/2014 11/14/2012  PHQ - 2 Score 0 0 1 0    Cognitive Testing MMSE - Mini Mental State Exam 09/16/2015  Orientation to time 5  Orientation to Place 5  Registration 3  Attention/ Calculation 5  Recall 3  Language- name 2 objects 2  Language- repeat 1  Language- follow 3 step command 3  Language- read & follow direction 1  Write a sentence 1  Copy design 1  Total score 30    Immunization History  Administered Date(s) Administered  . Influenza Split 11/27/2010, 12/26/2011, 02/16/2014  . Influenza,inj,Quad PF,36+ Mos 12/22/2014  . Influenza-Unspecified 01/26/2013  . Pneumococcal Conjugate-13 06/19/2014  . Pneumococcal Polysaccharide-23 03/28/2010  . Tdap 03/28/2010   Screening Tests Health Maintenance  Topic Date Due  . ZOSTAVAX  09/15/2016 (Originally 03/06/1999)  . INFLUENZA VACCINE  10/27/2015  . TETANUS/TDAP  03/28/2020  . PNA vac Low Risk Adult  Completed      Plan:     End of life planning; Advance aging; Advanced directives discussed. Copy of current HCPOA/Living Will requested.   During the course of the visit the patient was educated and counseled about the following appropriate screening and preventive services:   Vaccines to include Pneumoccal, Influenza, Hepatitis B, Td, Zostavax, HCV  Electrocardiogram  Cardiovascular Disease  Colorectal cancer screening  Diabetes screening  Prostate Cancer Screening  Glaucoma screening  Nutrition counseling   Smoking cessation counseling  Patient Instructions (the written plan) was given to the patient.    Varney Biles, LPN  [id number]

## 2015-09-18 DIAGNOSIS — C3412 Malignant neoplasm of upper lobe, left bronchus or lung: Secondary | ICD-10-CM | POA: Diagnosis not present

## 2015-09-21 ENCOUNTER — Ambulatory Visit: Payer: Medicare Other

## 2015-09-23 ENCOUNTER — Ambulatory Visit
Admission: RE | Admit: 2015-09-23 | Discharge: 2015-09-23 | Disposition: A | Discharge status: Home / Self Care | Payer: Medicare Other | Source: Ambulatory Visit | Attending: Radiation Oncology | Admitting: Radiation Oncology

## 2015-09-23 DIAGNOSIS — C3412 Malignant neoplasm of upper lobe, left bronchus or lung: Secondary | ICD-10-CM | POA: Diagnosis not present

## 2015-09-28 ENCOUNTER — Ambulatory Visit
Admission: RE | Admit: 2015-09-28 | Discharge: 2015-09-28 | Disposition: A | Discharge status: Home / Self Care | Payer: Medicare Other | Source: Ambulatory Visit | Attending: Radiation Oncology | Admitting: Radiation Oncology

## 2015-09-28 DIAGNOSIS — C3412 Malignant neoplasm of upper lobe, left bronchus or lung: Secondary | ICD-10-CM | POA: Diagnosis not present

## 2015-09-30 ENCOUNTER — Ambulatory Visit
Admission: RE | Admit: 2015-09-30 | Discharge: 2015-09-30 | Disposition: A | Discharge status: Home / Self Care | Payer: Medicare Other | Source: Ambulatory Visit | Attending: Radiation Oncology | Admitting: Radiation Oncology

## 2015-09-30 DIAGNOSIS — C3412 Malignant neoplasm of upper lobe, left bronchus or lung: Secondary | ICD-10-CM | POA: Diagnosis not present

## 2015-10-05 ENCOUNTER — Ambulatory Visit
Admission: RE | Admit: 2015-10-05 | Discharge: 2015-10-05 | Disposition: A | Discharge status: Home / Self Care | Payer: Medicare Other | Source: Ambulatory Visit | Attending: Radiation Oncology | Admitting: Radiation Oncology

## 2015-10-05 DIAGNOSIS — C3412 Malignant neoplasm of upper lobe, left bronchus or lung: Secondary | ICD-10-CM | POA: Diagnosis not present

## 2015-10-07 ENCOUNTER — Ambulatory Visit
Admission: RE | Admit: 2015-10-07 | Discharge: 2015-10-07 | Disposition: A | Discharge status: Home / Self Care | Payer: Medicare Other | Source: Ambulatory Visit | Attending: Radiation Oncology | Admitting: Radiation Oncology

## 2015-10-07 DIAGNOSIS — C3412 Malignant neoplasm of upper lobe, left bronchus or lung: Secondary | ICD-10-CM | POA: Diagnosis not present

## 2015-10-16 ENCOUNTER — Telehealth: Payer: Self-pay | Admitting: *Deleted

## 2015-10-16 ENCOUNTER — Encounter: Payer: Self-pay | Admitting: Internal Medicine

## 2015-10-16 ENCOUNTER — Ambulatory Visit (INDEPENDENT_AMBULATORY_CARE_PROVIDER_SITE_OTHER): Payer: Medicare Other

## 2015-10-16 ENCOUNTER — Ambulatory Visit (INDEPENDENT_AMBULATORY_CARE_PROVIDER_SITE_OTHER): Payer: Medicare Other | Admitting: Internal Medicine

## 2015-10-16 VITALS — BP 130/70 | HR 77 | Ht 67.0 in | Wt 158.8 lb

## 2015-10-16 DIAGNOSIS — R911 Solitary pulmonary nodule: Secondary | ICD-10-CM | POA: Diagnosis not present

## 2015-10-16 DIAGNOSIS — G4700 Insomnia, unspecified: Secondary | ICD-10-CM

## 2015-10-16 DIAGNOSIS — R3915 Urgency of urination: Secondary | ICD-10-CM

## 2015-10-16 DIAGNOSIS — J449 Chronic obstructive pulmonary disease, unspecified: Secondary | ICD-10-CM

## 2015-10-16 LAB — COMPREHENSIVE METABOLIC PANEL
ALK PHOS: 38 U/L — AB (ref 39–117)
ALT: 11 U/L (ref 0–53)
AST: 15 U/L (ref 0–37)
Albumin: 3.9 g/dL (ref 3.5–5.2)
BUN: 27 mg/dL — AB (ref 6–23)
CHLORIDE: 101 meq/L (ref 96–112)
CO2: 30 meq/L (ref 19–32)
Calcium: 9.7 mg/dL (ref 8.4–10.5)
Creatinine, Ser: 1.02 mg/dL (ref 0.40–1.50)
GFR: 75.35 mL/min (ref >60.00)
GLUCOSE: 100 mg/dL — AB (ref 70–99)
POTASSIUM: 4.5 meq/L (ref 3.5–5.1)
SODIUM: 138 meq/L (ref 135–145)
TOTAL PROTEIN: 7.8 g/dL (ref 6.0–8.3)
Total Bilirubin: 0.4 mg/dL (ref 0.2–1.2)

## 2015-10-16 LAB — POCT URINALYSIS DIPSTICK
Blood, UA: NEGATIVE
GLUCOSE UA: NEGATIVE
KETONES UA: NEGATIVE
LEUKOCYTES UA: NEGATIVE
NITRITE UA: NEGATIVE
PH UA: 6
Spec Grav, UA: 1.025
Urobilinogen, UA: 1

## 2015-10-16 MED ORDER — TRAZODONE HCL 50 MG PO TABS: 25.0000 mg | ORAL_TABLET | Freq: Every evening | ORAL | Status: DC | PRN

## 2015-10-16 NOTE — Assessment & Plan Note (Signed)
Some urgency of urination at night. Will check UA today. Likely BPH playing a role.

## 2015-10-16 NOTE — Patient Instructions (Addendum)
Chest xray today.  Labs today.  Follow up as scheduled with oncology.

## 2015-10-16 NOTE — Assessment & Plan Note (Signed)
LUL lung nodule. S/p XRT for suspected malignancy, as he is too ill to tolerate biopsy. Follow up with radiation oncology as scheduled.

## 2015-10-16 NOTE — Assessment & Plan Note (Signed)
Symptoms relatively stable, except for some increased mucous production with recent XRT. Will get CXR today. Continue to abstain from smoking. Continue current medications including Breo, Duoneb prn, Theophylline and Prednisone.

## 2015-10-16 NOTE — Progress Notes (Signed)
Pre visit review using our clinic review tool, if applicable. No additional management support is needed unless otherwise documented below in the visit note. 

## 2015-10-16 NOTE — Telephone Encounter (Signed)
Explained to patient unfortunately Dr. Nicki Reaper not accepting new patients at the time. Offered appt with NP for bridge. Patient says he is fine and feels he does not need to see PCP anytime soon. Will call to establish with new provider.

## 2015-10-16 NOTE — Telephone Encounter (Signed)
Please advise if Dr. Nicki Reaper will accept as a new patient, he was a pt in the past

## 2015-10-16 NOTE — Progress Notes (Signed)
Subjective:    Patient ID: Peter Johnston, male    DOB: [birth year], 75 y.o.   MRN: [id number]  HPI  75YO male presents for follow up.  Lung nodule - Completed 5 radiation treatments. Has follow up pending.  COPD - Quit smoking x 1 month. Feeling short of breath. Coughing up some yellow mucous, told to expect this from radiation treatment. Normally, on 3L [place]. Occasional chills. No fever.  Having some diarrhea on occasion and urgency with urination at times. Diarrhea is watery, non-bloody.  Having some trouble sleeping. Takes occasional Tylenol with some improvement.  Wt Readings from Last 3 Encounters:  10/16/15 158 lb 12.8 oz (72.031 kg)  09/16/15 157 lb (71.215 kg)  09/07/15 156 lb 8.4 oz (71 kg)   BP Readings from Last 3 Encounters:  10/16/15 130/70  09/16/15 128/72  09/07/15 134/68    Past Medical History  Diagnosis Date  . COPD (chronic obstructive pulmonary disease) ([place])   . Arthritis   . Hypertension   . Lung mass   . IDA (iron deficiency anemia)    Family History  Problem Relation Age of Onset  . Stroke Father   . Heart disease Father   . Hypertension Father   . Heart disease Brother 10   Past Surgical History  Procedure Laterality Date  . Hammer toe surgery  2011, 2012    x 2- Dr. Elvina Mattes  . Abdominal hernia repair  1995    Dr. Rochel Brome   Social History   Social History  . Marital Status: Widowed    Spouse Name: N/A  . Number of Children: 2  . Years of Education: N/A   Occupational History  . Retired     Social History Main Topics  . Smoking status: Former Smoker -- 2.00 packs/day for 63 years    Types: Cigarettes    Quit date: 09/13/2015  . Smokeless tobacco: Never Used     Comment: Stopped smoking cigarettes Sunday and uses the vapor e-cig to help with cravings  . Alcohol Use: 0.0 oz/week    0 Standard drinks or equivalent per week     Comment: 2-3 beers at night  . Drug Use: No  . Sexual Activity: No   Other Topics Concern    . None   Social History Narrative   Lives in [place] with son.      2 children, 2 grandchildren             Review of Systems  Constitutional: Negative for fever, chills, activity change, appetite change, fatigue and unexpected weight change.  Eyes: Negative for visual disturbance.  Respiratory: Positive for cough and shortness of breath. Negative for chest tightness and wheezing.   Cardiovascular: Negative for chest pain, palpitations and leg swelling.  Gastrointestinal: Negative for abdominal pain, diarrhea, constipation and abdominal distention.  Genitourinary: Positive for urgency and frequency. Negative for dysuria, penile swelling, difficulty urinating and penile pain.  Musculoskeletal: Positive for myalgias and arthralgias. Negative for gait problem.  Skin: Negative for color change and rash.  Hematological: Negative for adenopathy.  Psychiatric/Behavioral: Positive for sleep disturbance. Negative for suicidal ideas and dysphoric mood. The patient is not nervous/anxious.        Objective:    BP 130/70 mmHg  Pulse 77  Ht '5\' 7"'$  (1.702 m)  Wt 158 lb 12.8 oz (72.031 kg)  BMI 24.87 kg/m2  SpO2 91% Physical Exam  Constitutional: He is oriented to person, place, and time. He appears well-developed and  well-nourished. No distress.  HENT:  Head: Normocephalic and atraumatic.  Right Ear: External ear normal.  Left Ear: External ear normal.  Nose: Nose normal.  Mouth/Throat: Oropharynx is clear and moist. No oropharyngeal exudate.  Eyes: Conjunctivae and EOM are normal. Pupils are equal, round, and reactive to light. Right eye exhibits no discharge. Left eye exhibits no discharge. No scleral icterus.  Neck: Normal range of motion. Neck supple. No tracheal deviation present. No thyromegaly present.  Cardiovascular: Normal rate, regular rhythm and normal heart sounds.  Exam reveals no gallop and no friction rub.   No murmur heard. Pulmonary/Chest: Accessory muscle usage  present. Tachypnea noted. No respiratory distress. He has decreased breath sounds. He has no wheezes. He has no rhonchi. He has no rales. He exhibits deformity (barrell chest). He exhibits no tenderness.  Musculoskeletal: Normal range of motion. He exhibits no edema.  Lymphadenopathy:    He has no cervical adenopathy.  Neurological: He is alert and oriented to person, place, and time. No cranial nerve deficit. Coordination normal.  Skin: Skin is warm and dry. No rash noted. He is not diaphoretic. No erythema. No pallor.  Psychiatric: He has a normal mood and affect. His behavior is normal. Judgment and thought content normal.          Assessment & Plan:   Problem List Items Addressed This Visit      High   COPD, severe ([place]) - Primary (Chronic)    Symptoms relatively stable, except for some increased mucous production with recent XRT. Will get CXR today. Continue to abstain from smoking. Continue current medications including Breo, Duoneb prn, Theophylline and Prednisone.      Relevant Medications   theophylline (UNIPHYL) 400 MG 24 hr tablet   Other Relevant Orders   DG Chest 2 View     Unprioritized   Insomnia    Difficulty falling asleep recently. Discussed some options for OTC and Rx medication for this. Will add prn Trazodone 25-'50mg'$  at bedtime. Follow up in 4 weeks with oncology and radiation oncology. Follow up here in 8 weeks.      Relevant Medications   traZODone (DESYREL) 50 MG tablet   Lung nodule (Chronic)    LUL lung nodule. S/p XRT for suspected malignancy, as he is too ill to tolerate biopsy. Follow up with radiation oncology as scheduled.      Urinary urgency    Some urgency of urination at night. Will check UA today. Likely BPH playing a role.      Relevant Orders   Comp Met (CMET)   POCT Urinalysis Dipstick       Return in about 8 weeks (around 12/11/2015) for New Patient.  Ronette Deter, MD Internal Medicine [facility]

## 2015-10-16 NOTE — Assessment & Plan Note (Signed)
Difficulty falling asleep recently. Discussed some options for OTC and Rx medication for this. Will add prn Trazodone 25-'50mg'$  at bedtime. Follow up in 4 weeks with oncology and radiation oncology. Follow up here in 8 weeks.

## 2015-11-09 ENCOUNTER — Inpatient Hospital Stay: Payer: Medicare Other

## 2015-11-09 ENCOUNTER — Inpatient Hospital Stay: Payer: Medicare Other | Attending: Internal Medicine | Admitting: Internal Medicine

## 2015-11-09 DIAGNOSIS — Z79899 Other long term (current) drug therapy: Secondary | ICD-10-CM | POA: Diagnosis not present

## 2015-11-09 DIAGNOSIS — R0602 Shortness of breath: Secondary | ICD-10-CM | POA: Diagnosis not present

## 2015-11-09 DIAGNOSIS — M129 Arthropathy, unspecified: Secondary | ICD-10-CM | POA: Diagnosis not present

## 2015-11-09 DIAGNOSIS — F17290 Nicotine dependence, other tobacco product, uncomplicated: Secondary | ICD-10-CM | POA: Insufficient documentation

## 2015-11-09 DIAGNOSIS — Z87891 Personal history of nicotine dependence: Secondary | ICD-10-CM | POA: Insufficient documentation

## 2015-11-09 DIAGNOSIS — D509 Iron deficiency anemia, unspecified: Secondary | ICD-10-CM | POA: Diagnosis not present

## 2015-11-09 DIAGNOSIS — R911 Solitary pulmonary nodule: Secondary | ICD-10-CM

## 2015-11-09 DIAGNOSIS — I10 Essential (primary) hypertension: Secondary | ICD-10-CM

## 2015-11-09 DIAGNOSIS — J449 Chronic obstructive pulmonary disease, unspecified: Secondary | ICD-10-CM | POA: Diagnosis not present

## 2015-11-09 DIAGNOSIS — R634 Abnormal weight loss: Secondary | ICD-10-CM | POA: Insufficient documentation

## 2015-11-09 DIAGNOSIS — R918 Other nonspecific abnormal finding of lung field: Secondary | ICD-10-CM | POA: Diagnosis not present

## 2015-11-09 DIAGNOSIS — I700 Atherosclerosis of aorta: Secondary | ICD-10-CM | POA: Insufficient documentation

## 2015-11-09 DIAGNOSIS — F17210 Nicotine dependence, cigarettes, uncomplicated: Secondary | ICD-10-CM | POA: Insufficient documentation

## 2015-11-09 DIAGNOSIS — C3412 Malignant neoplasm of upper lobe, left bronchus or lung: Secondary | ICD-10-CM | POA: Insufficient documentation

## 2015-11-09 LAB — COMPREHENSIVE METABOLIC PANEL
ALBUMIN: 3.7 g/dL (ref 3.5–5.0)
ALK PHOS: 38 U/L (ref 38–126)
ALT: 11 U/L — AB (ref 17–63)
AST: 20 U/L (ref 15–41)
Anion gap: 5 (ref 5–15)
BILIRUBIN TOTAL: 0.4 mg/dL (ref 0.3–1.2)
BUN: 22 mg/dL — AB (ref 6–20)
CALCIUM: 8.8 mg/dL — AB (ref 8.9–10.3)
CO2: 27 mmol/L (ref 22–32)
CREATININE: 1.01 mg/dL (ref 0.61–1.24)
Chloride: 105 mmol/L (ref 101–111)
GFR calc Af Amer: >60 mL/min (ref >60)
GFR calc non Af Amer: >60 mL/min (ref >60)
GLUCOSE: 125 mg/dL — AB (ref 65–99)
Potassium: 3.6 mmol/L (ref 3.5–5.1)
SODIUM: 137 mmol/L (ref 135–145)
Total Protein: 7.3 g/dL (ref 6.5–8.1)

## 2015-11-09 LAB — CBC WITH DIFFERENTIAL/PLATELET
Basophils Absolute: 0.1 10*3/uL (ref 0–0.1)
Basophils Relative: 1 %
Eosinophils Absolute: 0.2 10*3/uL (ref 0–0.7)
Eosinophils Relative: 2 %
HCT: 32.7 % — ABNORMAL LOW (ref 40.0–52.0)
HEMOGLOBIN: 10.8 g/dL — AB (ref 13.0–18.0)
LYMPHS ABS: 2.1 10*3/uL (ref 1.0–3.6)
LYMPHS PCT: 23 %
MCH: 28 pg (ref 26.0–34.0)
MCHC: 33 g/dL (ref 32.0–36.0)
MCV: 84.9 fL (ref 80.0–100.0)
Monocytes Absolute: 0.9 10*3/uL (ref 0.2–1.0)
Monocytes Relative: 10 %
NEUTROS ABS: 5.8 10*3/uL (ref 1.4–6.5)
NEUTROS PCT: 64 %
Platelets: 280 10*3/uL (ref 150–440)
RBC: 3.85 MIL/uL — AB (ref 4.40–5.90)
RDW: 16.7 % — ABNORMAL HIGH (ref 11.5–14.5)
WBC: 9 10*3/uL (ref 3.8–10.6)

## 2015-11-09 NOTE — Progress Notes (Signed)
Patient states over past 3 mos he has had a lot of diarrhea.  States he stopped smoking 3 weeks ago.  Still uses e-cigarettes.

## 2015-11-09 NOTE — Assessment & Plan Note (Signed)
#   LEFT UPPER LOBE LUNG CA- suspicious for malignancy- especially since it is growing compared to previous scan in April 2016. S/p SBRT June 26th  # plan CT scan in 3 months.   # Patient will continue follow-up with me in 3 months.

## 2015-11-09 NOTE — Progress Notes (Signed)
[name]  Patient Care Team: Peter Confer, MD as PCP - General (Internal Medicine) Peter Gee, MD (Pulmonary Disease)  CHIEF COMPLAINTS/PURPOSE OF CONSULTATION:   Oncology History   # April 2016-LUL LUNG CA [clinical; no Bx sec to high risk] LUL LUNG NODULE [13m]; MAY 2017- 148m PET avid- no distant mets s/p SBRT [end of June 2017];  # COPD- home O2 [Dr.Saadat Johnston]     Primary cancer of left upper lobe of lung (Peter Johnston  11/09/2015 Initial Diagnosis    Primary cancer of left upper lobe of lung (Peter Johnston      HISTORY OF PRESENTING ILLNESS:  Peter Johnston.o.  male long-standing history of smoking who unfortunately continues to smoke; also history of COPD on 2-3 L of O2 left upper lobe lung nodule growing in size status post SB RT end of June 2017 is here for follow-up.  Patient continues to have chronic shortness of breath; on home O2. No new cough no hemoptysis. Denies any weight loss. Denies any headaches. No chest pain.  Unusual weight loss. Appetite is fair. He denies any pain.  ROS: A complete 10 point review of system is done which is negative except mentioned above in history of present illness  MEDICAL HISTORY:  Past Medical History:  Diagnosis Date  . Arthritis   . COPD (chronic obstructive pulmonary disease) ([facility]  . Hypertension   . IDA (iron deficiency anemia)   . Lung mass     SURGICAL HISTORY: Past Surgical History:  Procedure Laterality Date  . ABDOMINAL HERNIA REPAIR  1995   Dr. [place]ochel [place]. [place]   x 2- Dr. TrElvina Johnston  SOCIAL HISTORY: retired with [employer]; [place]; with girl friend; and son.  Social History   Social History  . Marital status: Widowed    Spouse name: N/A  . Number of children: 2  . Years of education: N/A   Occupational History  . Retired     Social History Main Topics  . Smoking status: Former Smoker    Packs/day: 2.00    Years: 63.00    Types:  Cigarettes    Quit date: 09/13/2015  . Smokeless tobacco: Never Used     Comment: Stopped smoking cigarettes Sunday and uses the vapor e-cig to help with cravings  . Alcohol use 0.0 oz/week     Comment: 2-3 beers at night  . Drug use: No  . Sexual activity: No   Other Topics Concern  . Not on file   Social History Narrative   [place]      2 children, 2 grandchildren             FAMILY HISTORY: Family History  Problem Relation Age of Onset  . Stroke Father   . Heart disease Father   . Hypertension Father   . Heart disease Brother [id number]  ALLERGIES:  is allergic to tamsulosin; amoxicillin-pot clavulanate; gold-containing drug products; hydrochlorothiazide; and sulfa antibiotics.  MEDICATIONS:  Current Outpatient Prescriptions  Medication Sig Dispense Refill  . allopurinol (ZYLOPRIM) 300 MG tablet Take 300 mg by mouth daily.    . Marland Kitchentenolol (TENORMIN) 25 MG tablet TAKE 1 TABLET DAILY (Patient taking differently: TAKE 1 /2 TABLET DAILY) 90 tablet 3  . ENBREL 50 MG/ML injection 50 mg once a week.     . fenofibrate (TRICOR) 145 MG tablet TAKE 1 TABLET  DAILY 90 tablet 1  . Fluticasone Furoate-Vilanterol (BREO ELLIPTA) 100-25 MCG/INH AEPB Inhale 1 puff into the lungs daily. 60 each 6  . hydroxychloroquine (PLAQUENIL) 200 MG tablet Take 200 mg by mouth daily.    Marland Kitchen ipratropium-albuterol (DUONEB) 0.5-2.5 (3) MG/3ML SOLN Inhale 3 mLs into the lungs every 4 (four) hours as needed. 90 mL 3  . omeprazole (PRILOSEC) 40 MG capsule TAKE 1 CAPSULE DAILY 90 capsule 3  . predniSONE (DELTASONE) 5 MG tablet Take 5 mg by mouth daily.     . theophylline (THEO-24) 100 MG 24 hr capsule Take 50 mg by mouth daily.     . theophylline (UNIPHYL) 400 MG 24 hr tablet     . traMADol (ULTRAM) 50 MG tablet TAKE ONE TABLET BY MOUTH EVERY 12 HOURS AS NEEDED 60 tablet 0  . TRAVATAN Z 0.004 % SOLN ophthalmic solution     . traZODone (DESYREL) 50 MG tablet Take 0.5-1 tablets (25-50 mg total)  by mouth at bedtime as needed for sleep. 30 tablet 3   No current facility-administered medications for this visit.       Marland Kitchen  PHYSICAL EXAMINATION: ECOG PERFORMANCE STATUS: 2 - Symptomatic, <50% confined to bed  Vitals:   11/09/15 1150  BP: 111/70  Pulse: 65  Resp: 18  Temp: 97.8 F (36.6 C)   Filed Weights   11/09/15 1150  Weight: 166 lb (75.3 kg)    GENERAL: Well-nourished well-developed; Alert, no distress and comfortable.  He is alone. He is wearing oxygen. He is walking himself. EYES: no pallor or icterus OROPHARYNX: no thrush or ulceration.  NECK: supple, no masses felt LYMPH:  no palpable lymphadenopathy in the cervical, axillary or inguinal regions LUNGS: Bilateral decreased air entry ; no wheeze or crackles HEART/CVS: regular rate & rhythm and no murmurs; No lower extremity edema ABDOMEN: abdomen soft, non-tender and normal bowel sounds Musculoskeletal:no cyanosis of digits and no clubbing  PSYCH: alert & oriented x 3 with fluent speech NEURO: no focal motor/sensory deficits SKIN:  no rashes or significant lesions  LABORATORY DATA:  I have reviewed the data as listed Lab Results  Component Value Date   WBC 9.0 11/09/2015   HGB 10.8 (L) 11/09/2015   HCT 32.7 (L) 11/09/2015   MCV 84.9 11/09/2015   PLT 280 11/09/2015    Recent Labs  08/26/15 1230 10/16/15 1119 11/09/15 1058  NA 136 138 137  K 3.9 4.5 3.6  CL 100* 101 105  CO2 '30 30 27  '$ GLUCOSE 99 100* 125*  BUN 22* 27* 22*  CREATININE 1.16 1.02 1.01  CALCIUM 9.1 9.7 8.8*  GFRNONAA 59*  --  >60  GFRAA >60  --  >60  PROT 7.9 7.8 7.3  ALBUMIN 3.9 3.9 3.7  AST '29 15 20  '$ ALT 16* 11 11*  ALKPHOS 43 38* 38  BILITOT 0.5 0.4 0.4    RADIOGRAPHIC STUDIES: I have personally reviewed the radiological images as listed and agreed with the findings in the report. Dg Chest 2 View  Result Date: 10/16/2015 CLINICAL DATA:  Cough, chronic obstructive pulmonary disease. EXAM: CHEST  2 VIEW COMPARISON:   Radiographs of December 06, 2013. PET scan of August 27, 2015. FINDINGS: Stable cardiomediastinal silhouette. No pneumothorax or pleural effusion is noted. Stable emphysematous disease is noted in the upper lobes bilaterally with bibasilar scarring. Ossification of anterior longitudinal ligament is noted. Atherosclerosis of thoracic aorta is noted. IMPRESSION: Aortic atherosclerosis. Stable emphysematous disease in the upper lobes bilaterally. No significant change  compared to prior exam. Electronically Signed   By: Marijo Conception, M.D.   On: 10/16/2015 15:33    ASSESSMENT & PLAN:   Primary cancer of left upper lobe of lung ([place]) # LEFT UPPER LOBE LUNG CA- suspicious for malignancy- especially since it is growing compared to previous scan in April 2016. S/p SBRT June 26th  # plan CT scan in 3 months.   # Patient will continue follow-up with me in 3 months.     Cammie Sickle, MD 11/09/2015 5:11 PM

## 2015-11-19 ENCOUNTER — Encounter: Payer: Self-pay | Admitting: Radiation Oncology

## 2015-11-19 ENCOUNTER — Ambulatory Visit
Admission: RE | Admit: 2015-11-19 | Discharge: 2015-11-19 | Disposition: A | Discharge status: Home / Self Care | Payer: Medicare Other | Source: Ambulatory Visit | Attending: Radiation Oncology | Admitting: Radiation Oncology

## 2015-11-19 VITALS — BP 109/59 | HR 73 | Temp 98.0°F

## 2015-11-19 DIAGNOSIS — R918 Other nonspecific abnormal finding of lung field: Secondary | ICD-10-CM

## 2015-11-19 NOTE — Progress Notes (Signed)
Radiation Oncology Follow up Note  Name: Peter Johnston   Date:   11/19/2015 MRN:  [id number] DOB: [birth year]    This 75 y.o. male presents to the clinic today for one-month follow-up status post SB RT for stage I non-small cell lung cancer of the left upper lobe.  REFERRING PROVIDER: Jackolyn Confer, MD  HPI: Patient is a 75 year old male now out 1 month having completed SB RT to his left upper lobe for stage I non-small cell lung cancer. He is seen today in routine follow-up and is doing fairly well. He does have significant pulmonary disease oxygen dependent. He states his breathing is no worse although not improved. He specifically denies dysphagia cough hemoptysis or chest tightness..  COMPLICATIONS OF TREATMENT: none  FOLLOW UP COMPLIANCE: keeps appointments   PHYSICAL EXAM:  BP (!) 109/59   Pulse 73   Temp 98 F (36.7 C)  Well-developed oxygen dependent male on nasal oxygen wheelchair-bound in NAD. Well-developed well-nourished patient in NAD. HEENT reveals PERLA, EOMI, discs not visualized.  Oral cavity is clear. No oral mucosal lesions are identified. Neck is clear without evidence of cervical or supraclavicular adenopathy. Lungs are clear to A&P. Cardiac examination is essentially unremarkable with regular rate and rhythm without murmur rub or thrill. Abdomen is benign with no organomegaly or masses noted. Motor sensory and DTR levels are equal and symmetric in the upper and lower extremities. Cranial nerves II through XII are grossly intact. Proprioception is intact. No peripheral adenopathy or edema is identified. No motor or sensory levels are noted. Crude visual fields are within normal range.  RADIOLOGY RESULTS: CT scan of the contrast has been ordered in November  PLAN: Present time patient is doing well. Side effects or complaints from his SB RT treatment. I have set him up for follow-up shortly after his CT scan of his chest in November. Patient also continues close  follow-up care with medical oncology. Patient knows to call with any concerns.  I would like to take this opportunity to thank you for allowing me to participate in the care of your patient.Armstead Peaks., MD

## 2016-01-13 ENCOUNTER — Other Ambulatory Visit: Payer: Self-pay

## 2016-01-13 MED ORDER — ATENOLOL 25 MG PO TABS: 25.0000 mg | ORAL_TABLET | Freq: Every day | ORAL | 3 refills | Status: DC

## 2016-01-18 ENCOUNTER — Other Ambulatory Visit: Payer: Self-pay | Admitting: *Deleted

## 2016-01-18 NOTE — Telephone Encounter (Signed)
Pt hs requested a medication refill for fenofibrate  Pt will establish with Dr. Lacinda Axon in December  [facility]

## 2016-01-19 MED ORDER — FENOFIBRATE 145 MG PO TABS: 145.0000 mg | ORAL_TABLET | Freq: Every day | ORAL | 1 refills | Status: DC

## 2016-01-19 NOTE — Telephone Encounter (Signed)
Refilled 05/11/15. Last seen by Dr.Walker 10/16/15. Please advise?

## 2016-02-09 ENCOUNTER — Ambulatory Visit
Admission: RE | Admit: 2016-02-09 | Discharge: 2016-02-09 | Disposition: A | Discharge status: Home / Self Care | Payer: Medicare Other | Source: Ambulatory Visit | Attending: Internal Medicine | Admitting: Internal Medicine

## 2016-02-09 ENCOUNTER — Telehealth: Payer: Self-pay | Admitting: *Deleted

## 2016-02-09 DIAGNOSIS — C3412 Malignant neoplasm of upper lobe, left bronchus or lung: Secondary | ICD-10-CM | POA: Insufficient documentation

## 2016-02-09 DIAGNOSIS — R918 Other nonspecific abnormal finding of lung field: Secondary | ICD-10-CM | POA: Diagnosis not present

## 2016-02-09 HISTORY — DX: Unspecified asthma, uncomplicated: J45.909

## 2016-02-09 LAB — POCT I-STAT CREATININE: CREATININE: 1.1 mg/dL (ref 0.61–1.24)

## 2016-02-09 MED ORDER — IOPAMIDOL (ISOVUE-300) INJECTION 61%
75.0000 mL | Freq: Once | INTRAVENOUS | Status: AC | PRN
  Administered 2016-02-09: 75 mL via INTRAVENOUS

## 2016-02-09 NOTE — Telephone Encounter (Signed)
IMPRESSION: 1. Slight contraction of LEFT upper lobe nodule following radiotherapy. 2. New nodular patchy consolidation within the inferior upper lobes and lower lobes is presumed related to radiation therapy. Would also consider aspiration pneumonia or pneumonitis and drug reaction. These results will be called to the ordering clinician or representative by the Radiologist Assistant, and communication documented in the PACS or zVision Dashboard.

## 2016-02-10 ENCOUNTER — Telehealth: Payer: Self-pay | Admitting: *Deleted

## 2016-02-10 NOTE — Telephone Encounter (Signed)
Having pain form his back down into his left leg to the knee. Had CT yesterday and this happened over night. He is unable to walk due to the pain. He and she were both advised to call EMS to go to the ER for evaluation since there is no one who can get him up even to go to the bathroom. She is in agreement with this, he is unsure, but did finally agree to go. He asked about results form scan since "i probably wont be able to  Make it to my appt tomorrow if I am still like this"

## 2016-02-11 ENCOUNTER — Inpatient Hospital Stay: Payer: Medicare Other | Admitting: Internal Medicine

## 2016-02-11 ENCOUNTER — Other Ambulatory Visit: Payer: Medicare Other

## 2016-02-11 ENCOUNTER — Ambulatory Visit: Payer: Medicare Other | Admitting: Radiation Oncology

## 2016-02-24 ENCOUNTER — Inpatient Hospital Stay: Payer: Medicare Other | Attending: Internal Medicine | Admitting: Internal Medicine

## 2016-02-24 VITALS — BP 136/85 | HR 74 | Temp 97.4°F | Resp 20 | Wt 158.0 lb

## 2016-02-24 DIAGNOSIS — I509 Heart failure, unspecified: Secondary | ICD-10-CM

## 2016-02-24 DIAGNOSIS — Z79899 Other long term (current) drug therapy: Secondary | ICD-10-CM

## 2016-02-24 DIAGNOSIS — I10 Essential (primary) hypertension: Secondary | ICD-10-CM | POA: Insufficient documentation

## 2016-02-24 DIAGNOSIS — J449 Chronic obstructive pulmonary disease, unspecified: Secondary | ICD-10-CM

## 2016-02-24 DIAGNOSIS — R918 Other nonspecific abnormal finding of lung field: Secondary | ICD-10-CM | POA: Insufficient documentation

## 2016-02-24 DIAGNOSIS — Z9981 Dependence on supplemental oxygen: Secondary | ICD-10-CM | POA: Diagnosis not present

## 2016-02-24 DIAGNOSIS — R0602 Shortness of breath: Secondary | ICD-10-CM | POA: Insufficient documentation

## 2016-02-24 DIAGNOSIS — M129 Arthropathy, unspecified: Secondary | ICD-10-CM | POA: Diagnosis not present

## 2016-02-24 DIAGNOSIS — C3412 Malignant neoplasm of upper lobe, left bronchus or lung: Secondary | ICD-10-CM | POA: Diagnosis not present

## 2016-02-24 DIAGNOSIS — F17210 Nicotine dependence, cigarettes, uncomplicated: Secondary | ICD-10-CM | POA: Diagnosis not present

## 2016-02-24 MED ORDER — AMOXICILLIN 500 MG PO CAPS: 500.0000 mg | ORAL_CAPSULE | Freq: Three times a day (TID) | ORAL | 0 refills | Status: DC

## 2016-02-24 NOTE — Progress Notes (Signed)
[facility] NOTE  Patient Care Team: Jackolyn Confer, MD as PCP - General (Internal Medicine) Allyne Gee, MD (Pulmonary Disease)  CHIEF COMPLAINTS/PURPOSE OF CONSULTATION:   Oncology History   # April 2016-LUL LUNG CA [clinical; no Bx sec to high risk] LUL LUNG NODULE [60m]; MAY 2017- 135m PET avid- no distant mets s/p SBRT [end of June 2017];   # NOV 22nd CT scan- Improved LUL nodule  # COPD- home O2 [Dr.Saadat Khan]     Primary cancer of left [place] lobe of lung ([place]  11/09/2015 Initial Diagnosis    Primary cancer of left upper lobe of lung ([facility]       HISTORY OF PRESENTING ILLNESS:  ChSHER HELLINGER89.o.  male long-standing history of smoking who unfortunately continues to smoke; also history of COPD on 2-3 L of O2 left upper lobe lung nodule growing in size status post SB RT end of June 2017 is here for follow-up/ review the results of the CT scan.   Patient continues to have chronic shortness of breath; on home O2. Has chronic cough especially the morning. No hemoptysis. He continues to be on 2-3 L of oxygen. Denies any weight loss. Denies any headaches. No chest pain.  He noted to have mild pain in his back after the CT scan. Improved after ice packs.   ROS: A complete 10 point review of system is done which is negative except mentioned above in history of present illness  MEDICAL HISTORY:  Past Medical History:  Diagnosis Date  . Arthritis   . Asthma   . COPD (chronic obstructive pulmonary disease) ([place]  . Hypertension   . IDA (iron deficiency anemia)   . Lung mass     SURGICAL HISTORY: Past Surgical History:  Procedure Laterality Date  . ABDOMINAL HERNIA REPAIR  1995   Dr. WiRochel [place]rome. [place], 2012   x 2- Dr. TrElvina Mattes  SOCIAL HISTORY: retired with [employer]; [place]; with girl friend; and son.  Social History   Social History  . Marital status: Widowed    Spouse name: N/A  . Number of children:  2  . Years of education: N/A   Occupational History  . Retired     Social History Main Topics  . Smoking status: Former Smoker    Packs/day: 2.00    Years: 63.00    Types: Cigarettes    Quit date: 09/13/2015  . Smokeless tobacco: Never Used     Comment: Stopped smoking cigarettes Sunday and uses the vapor e-cig to help with cravings  . Alcohol use 0.0 oz/week     Comment: 2-3 beers at night  . Drug use: No  . Sexual activity: No   Other Topics Concern  . Not on file   Social History Narrative   [place]      2 children, 2 grandchildren             FAMILY HISTORY: Family History  Problem Relation Age of Onset  . Stroke Father   . Heart disease Father   . Hypertension Father   . Heart disease Brother 5096  ALLERGIES:  is allergic to tamsulosin; gold-containing drug products; hydrochlorothiazide; and sulfa antibiotics.  MEDICATIONS:  Current Outpatient Prescriptions  Medication Sig Dispense Refill  . allopurinol (ZYLOPRIM) 300 MG tablet Take 300 mg by mouth daily.    . Marland Kitchentenolol (TENORMIN) 25  MG tablet Take 1 tablet (25 mg total) by mouth daily. 90 tablet 3  . ENBREL 50 MG/ML injection 50 mg once a week.     . fenofibrate (TRICOR) 145 MG tablet Take 1 tablet (145 mg total) by mouth daily. 90 tablet 1  . Fluticasone Furoate-Vilanterol (BREO ELLIPTA) 100-25 MCG/INH AEPB Inhale 1 puff into the lungs daily. 60 each 6  . hydroxychloroquine (PLAQUENIL) 200 MG tablet Take 200 mg by mouth daily.    Marland Kitchen ipratropium-albuterol (DUONEB) 0.5-2.5 (3) MG/3ML SOLN Inhale 3 mLs into the lungs every 4 (four) hours as needed. 90 mL 3  . omeprazole (PRILOSEC) 40 MG capsule TAKE 1 CAPSULE DAILY 90 capsule 3  . predniSONE (DELTASONE) 5 MG tablet Take 5 mg by mouth daily.     . theophylline (UNIPHYL) 400 MG 24 hr tablet     . traMADol (ULTRAM) 50 MG tablet TAKE ONE TABLET BY MOUTH EVERY 12 HOURS AS NEEDED 60 tablet 0  . TRAVATAN Z 0.004 % SOLN ophthalmic solution     .  traZODone (DESYREL) 50 MG tablet Take 0.5-1 tablets (25-50 mg total) by mouth at bedtime as needed for sleep. 30 tablet 3  . amoxicillin (AMOXIL) 500 MG capsule Take 1 capsule (500 mg total) by mouth 3 (three) times daily. 30 capsule 0  . furosemide (LASIX) 20 MG tablet Take by mouth.     No current facility-administered medications for this visit.       Marland Kitchen  PHYSICAL EXAMINATION: ECOG PERFORMANCE STATUS: 2 - Symptomatic, <50% confined to bed  Vitals:   02/24/16 1212  BP: 136/85  Pulse: 74  Resp: 20  Temp: 97.4 F (36.3 C)   Filed Weights   02/24/16 1212  Weight: 158 lb (71.7 kg)    GENERAL: Well-nourished well-developed; Alert, no distress and comfortable.  He is alone. He is wearing oxygen. He is in a wheel chair.  EYES: no pallor or icterus OROPHARYNX: no thrush or ulceration.  NECK: supple, no masses felt LYMPH:  no palpable lymphadenopathy in the cervical, axillary or inguinal regions LUNGS: Bilateral decreased air entry ; no wheeze or crackles HEART/CVS: regular rate & rhythm and no murmurs; No lower extremity edema ABDOMEN: abdomen soft, non-tender and normal bowel sounds Musculoskeletal:no cyanosis of digits and no clubbing  PSYCH: alert & oriented x 3 with fluent speech NEURO: no focal motor/sensory deficits SKIN:  no rashes or significant lesions  LABORATORY DATA:  I have reviewed the data as listed Lab Results  Component Value Date   WBC 9.0 11/09/2015   HGB 10.8 (L) 11/09/2015   HCT 32.7 (L) 11/09/2015   MCV 84.9 11/09/2015   PLT 280 11/09/2015    Recent Labs  08/26/15 1230 10/16/15 1119 11/09/15 1058 02/09/16 1034  NA 136 138 137  --   K 3.9 4.5 3.6  --   CL 100* 101 105  --   CO2 '30 30 27  '$ --   GLUCOSE 99 100* 125*  --   BUN 22* 27* 22*  --   CREATININE 1.16 1.02 1.01 1.10  CALCIUM 9.1 9.7 8.8*  --   GFRNONAA 59*  --  >60  --   GFRAA >60  --  >60  --   PROT 7.9 7.8 7.3  --   ALBUMIN 3.9 3.9 3.7  --   AST '29 15 20  '$ --   ALT 16* 11 11*   --   ALKPHOS 43 38* 38  --   BILITOT 0.5 0.4 0.4  --  RADIOGRAPHIC STUDIES: I have personally reviewed the radiological images as listed and agreed with the findings in the report. Ct Chest W Contrast  Result Date: 02/09/2016 CLINICAL DATA:  Followup CT lung screen from 08/11/2015. Patient status post radiotherapy for hypermetabolic LEFT upper lobe pulmonary nodule. Severe emphysema EXAM: CT CHEST WITH CONTRAST TECHNIQUE: Multidetector CT imaging of the chest was performed during intravenous contrast administration. CONTRAST:  84m ISOVUE-300 IOPAMIDOL (ISOVUE-300) INJECTION 61% COMPARISON:  PET-CT 08/27/2015 FINDINGS: Cardiovascular: Coronary artery calcification and aortic atherosclerotic calcification. 10 mm ulcerated plaque along the transverse aorta (image 111, series 6) Mediastinum/Nodes: No axillary or supraclavicular adenopathy. No mediastinal hilar adenopathy. No pericardial fluid. Esophagus normal. Lungs/Pleura: 10 mm x 9 mm nodule in the LEFT upper lobe compares to 13 mm times 11 on comparison PET-CT from 08/27/2015. Severe centrilobular emphysema in the upper lobes with bullous change. New focus nodular thickening in the lingula measures 7 mm (image 100, series 3). New bibasilar patchy airspace consolidation in the inferior RIGHT middle lobe and LEFT lower lobe (image 125, series 3). Similar findings in the RIGHT lower lobe on image 136, series 3 Upper Abdomen: Limited view of the liver, kidneys, pancreas are unremarkable. Normal adrenal glands. Musculoskeletal: No aggressive osseous lesion. IMPRESSION: 1. Slight contraction of LEFT upper lobe nodule following radiotherapy. 2. New nodular patchy consolidation within the inferior upper lobes and lower lobes is presumed related to radiation therapy. Would also consider aspiration pneumonia or pneumonitis and drug reaction. These results will be called to the ordering clinician or representative by the Radiologist Assistant, and communication  documented in the PACS or zVision Dashboard. Electronically Signed   By: SSuzy BouchardM.D.   On: 02/09/2016 13:05    ASSESSMENT & PLAN:   Primary cancer of left upper lobe of lung ([place] # LEFT UPPER LOBE LUNG CA- STAGE I;  suspicious for malignancy- especially since it is growing compared to previous scan in April 2016. S/p SBRT June 26th. NOV 2017- improvement noted  # Bil Lower lobe infiltrates- ? Aspiration; discussed re: aspiration precautions; recommend Amoxicillin 500 TID.    # Patient will continue follow-up with me in 6 months/ scan prior.   Cc: Dr.Cook.     I reviewed the images myself and with the patient in detail.    GCammie Sickle MD 02/24/2016 12:49 PM

## 2016-02-24 NOTE — Assessment & Plan Note (Addendum)
#   LEFT UPPER LOBE LUNG CA- STAGE I;  suspicious for malignancy- especially since it is growing compared to previous scan in April 2016. S/p SBRT June 26th. NOV 2017- improvement noted  # Bil Lower lobe infiltrates- ? Aspiration; discussed re: aspiration precautions; recommend Amoxicillin 500 TID.    # Patient will continue follow-up with me in 6 months/ scan prior.   Cc: Dr.Cook.

## 2016-02-24 NOTE — Progress Notes (Signed)
Patient o2 is 76 he is on 2 l of oxygen right now but his home machine is 3l.

## 2016-02-26 ENCOUNTER — Encounter: Payer: Self-pay | Admitting: Radiation Oncology

## 2016-02-26 ENCOUNTER — Ambulatory Visit
Admission: RE | Admit: 2016-02-26 | Discharge: 2016-02-26 | Disposition: A | Discharge status: Home / Self Care | Payer: Medicare Other | Source: Ambulatory Visit | Attending: Radiation Oncology | Admitting: Radiation Oncology

## 2016-02-26 VITALS — BP 126/72 | HR 81 | Temp 96.8°F | Resp 22 | Wt 158.3 lb

## 2016-02-26 DIAGNOSIS — C3412 Malignant neoplasm of upper lobe, left bronchus or lung: Secondary | ICD-10-CM | POA: Diagnosis present

## 2016-02-26 DIAGNOSIS — Z923 Personal history of irradiation: Secondary | ICD-10-CM | POA: Diagnosis not present

## 2016-02-26 DIAGNOSIS — R05 Cough: Secondary | ICD-10-CM | POA: Diagnosis not present

## 2016-02-26 DIAGNOSIS — Z993 Dependence on wheelchair: Secondary | ICD-10-CM | POA: Diagnosis not present

## 2016-02-26 DIAGNOSIS — Z9981 Dependence on supplemental oxygen: Secondary | ICD-10-CM | POA: Insufficient documentation

## 2016-02-26 DIAGNOSIS — F17210 Nicotine dependence, cigarettes, uncomplicated: Secondary | ICD-10-CM | POA: Insufficient documentation

## 2016-02-26 DIAGNOSIS — R918 Other nonspecific abnormal finding of lung field: Secondary | ICD-10-CM

## 2016-02-26 NOTE — Progress Notes (Signed)
Radiation Oncology Follow up Note  Name: Peter Johnston   Date:   02/26/2016 MRN:  [id number] DOB: [birth year]    This 75 y.o. male presents to the clinic today for 4 month follow-up status post SB RT of left upper lobe.  REFERRING PROVIDER: Jackolyn Confer, MD  HPI: Patient is a 31 rolled male now out 4 months having completed SB RT for a non-small cell lung cancer of the left upper lobe. He is seen today in routine follow-up is doing fairly well he has had a greenish productive cough recent is been placed on antibiotic therapy which she is not yet filled. Recently had a CT scan also showing stable left upper lobe lesion with radiation changes some areas which may be pneumonia again a good reason to be put on antibiotic therapy. He specifically denies hemoptysis dysphasia. He is on nasal oxygen and wheelchair-bound..  COMPLICATIONS OF TREATMENT: none  FOLLOW UP COMPLIANCE: keeps appointments   PHYSICAL EXAM:  BP 126/72   Pulse 81   Temp (!) 96.8 F (36 C)   Resp (!) 22   Wt 158 lb 4.6 oz (71.8 kg)   BMI 24.79 kg/m  Well-developed elderly male wheelchair-bound on nasal oxygen. Well-developed well-nourished patient in NAD. HEENT reveals PERLA, EOMI, discs not visualized.  Oral cavity is clear. No oral mucosal lesions are identified. Neck is clear without evidence of cervical or supraclavicular adenopathy. Lungs are clear to A&P. Cardiac examination is essentially unremarkable with regular rate and rhythm without murmur rub or thrill. Abdomen is benign with no organomegaly or masses noted. Motor sensory and DTR levels are equal and symmetric in the upper and lower extremities. Cranial nerves II through XII are grossly intact. Proprioception is intact. No peripheral adenopathy or edema is identified. No motor or sensory levels are noted. Crude visual fields are within normal range.  RADIOLOGY RESULTS: Most recent CT scan is reviewed and compared with prior studies  PLAN: Present time  patient is doing well and please with the CT findings. I've asked to see him back in 6 months for follow-up. Will probably obtain another CT scan in about a years time. I've encouraged the patient to have his prescription for antibiotic therapy filled and complete the entire prescription. Patient continues close follow-up care with medical oncology. He knows to call our office with any concerns.  I would like to take this opportunity to thank you for allowing me to participate in the care of your patient.Armstead Peaks., MD

## 2016-03-03 ENCOUNTER — Ambulatory Visit (INDEPENDENT_AMBULATORY_CARE_PROVIDER_SITE_OTHER): Payer: Medicare Other | Admitting: Family Medicine

## 2016-03-03 ENCOUNTER — Encounter: Payer: Self-pay | Admitting: Family Medicine

## 2016-03-03 VITALS — BP 117/71 | HR 76 | Temp 97.5°F | Resp 16 | Wt 160.0 lb

## 2016-03-03 DIAGNOSIS — E785 Hyperlipidemia, unspecified: Secondary | ICD-10-CM | POA: Diagnosis not present

## 2016-03-03 DIAGNOSIS — D509 Iron deficiency anemia, unspecified: Secondary | ICD-10-CM | POA: Diagnosis not present

## 2016-03-03 DIAGNOSIS — J449 Chronic obstructive pulmonary disease, unspecified: Secondary | ICD-10-CM

## 2016-03-03 DIAGNOSIS — I10 Essential (primary) hypertension: Secondary | ICD-10-CM

## 2016-03-03 DIAGNOSIS — L853 Xerosis cutis: Secondary | ICD-10-CM

## 2016-03-03 DIAGNOSIS — Z7952 Long term (current) use of systemic steroids: Secondary | ICD-10-CM | POA: Diagnosis not present

## 2016-03-03 DIAGNOSIS — Z1159 Encounter for screening for other viral diseases: Secondary | ICD-10-CM

## 2016-03-03 DIAGNOSIS — Z114 Encounter for screening for human immunodeficiency virus [HIV]: Secondary | ICD-10-CM

## 2016-03-03 LAB — CBC
HEMATOCRIT: 29.6 % — AB (ref 39.0–52.0)
Hemoglobin: 9.2 g/dL — ABNORMAL LOW (ref 13.0–17.0)
MCHC: 31.2 g/dL (ref 30.0–36.0)
MCV: 73.6 fl — ABNORMAL LOW (ref 78.0–100.0)
Platelets: 304 10*3/uL (ref 150.0–400.0)
RBC: 4.02 Mil/uL — ABNORMAL LOW (ref 4.22–5.81)
RDW: 17.5 % — AB (ref 11.5–15.5)
WBC: 10.7 10*3/uL — AB (ref 4.0–10.5)

## 2016-03-03 LAB — COMPREHENSIVE METABOLIC PANEL
ALK PHOS: 41 U/L (ref 39–117)
ALT: 10 U/L (ref 0–53)
AST: 17 U/L (ref 0–37)
Albumin: 3.7 g/dL (ref 3.5–5.2)
BUN: 20 mg/dL (ref 6–23)
CO2: 33 meq/L — AB (ref 19–32)
Calcium: 9.5 mg/dL (ref 8.4–10.5)
Chloride: 102 mEq/L (ref 96–112)
Creatinine, Ser: 1.05 mg/dL (ref 0.40–1.50)
GFR: 72.8 mL/min (ref >60.00)
GLUCOSE: 107 mg/dL — AB (ref 70–99)
POTASSIUM: 4.3 meq/L (ref 3.5–5.1)
SODIUM: 141 meq/L (ref 135–145)
TOTAL PROTEIN: 7.2 g/dL (ref 6.0–8.3)
Total Bilirubin: 0.3 mg/dL (ref 0.2–1.2)

## 2016-03-03 LAB — LIPID PANEL
CHOL/HDL RATIO: 2
Cholesterol: 155 mg/dL (ref 0–200)
HDL: 64.2 mg/dL (ref >39.00)
LDL Cholesterol: 68 mg/dL (ref 0–99)
NONHDL: 90.66
Triglycerides: 111 mg/dL (ref 0.0–149.0)
VLDL: 22.2 mg/dL (ref 0.0–40.0)

## 2016-03-03 LAB — HEMOGLOBIN A1C: HEMOGLOBIN A1C: 5.7 % (ref 4.6–6.5)

## 2016-03-03 NOTE — Assessment & Plan Note (Signed)
Has been stable. Lipid panel today. Continue tricor.

## 2016-03-03 NOTE — Patient Instructions (Signed)
Continue your meds.  Discuss prednisone with Dr. Humphrey Rolls (I doubt this is going to help much at this point).  Continue your meds.  No evidence of rash.  Follow up in 3 months.  Take care  Dr. Lacinda Axon

## 2016-03-03 NOTE — Assessment & Plan Note (Signed)
Severe, end stage. Patient does not seem to understand the fact that his advanced pulmonary disease is not going to improve and that is the primary reason that he is fatigue and unable to do things without significant dyspnea. Patient requested an increase in his prednisone and I informed him that he should discuss this with his oncologist. He does not appear to be having an acute exacerbation and therefore this is likely to be a little benefit.

## 2016-03-03 NOTE — Progress Notes (Signed)
Pre visit review using our clinic review tool, if applicable. No additional management support is needed unless otherwise documented below in the visit note. 

## 2016-03-03 NOTE — Progress Notes (Signed)
Subjective:  Patient ID: Peter Johnston, male    DOB: [birth year]  Age: 75 y.o. MRN: [id number]  CC: Follow up  HPI:  75 year old male with an extensive past medical history including COPD and chronic respiratory failure, hypertension, rheumatoid arthritis, lung cancer presents for follow-up. Issues are below.   HTN  Stable on Atenolol.  HLD  Labs have been stable.  Needs repeat labs today.  Currently on Tricor.  COPD, severe, end stage  Advanced lung disease.  Patient does not seem to understand the extensive nature of his disease.  He has severe shortness of breath at baseline. He's unable to do much of any activity.  He is on chronic steroids and is on 3 L of oxygen at home.  Has quit smoking.  Needs handicap placard.   Wants increase in prednisone.  Rash  Reports ongoing rash on the extremities and on the abdomen.  Reports that it's slightly itchy.  No medication or intervention stride.  No new exposures.  No known exacerbating or relieving factors.  Social Hx   Social History   Social History  . Marital status: Widowed    Spouse name: N/A  . Number of children: 2  . Years of education: N/A   Occupational History  . Retired     Social History Main Topics  . Smoking status: Former Smoker    Packs/day: 2.00    Years: 63.00    Types: Cigarettes    Quit date: 09/13/2015  . Smokeless tobacco: Never Used     Comment: Stopped smoking cigarettes Sunday and uses the vapor e-cig to help with cravings  . Alcohol use 0.0 oz/week     Comment: 2-3 beers at night  . Drug use: No  . Sexual activity: No   Other Topics Concern  . None   Social History Narrative   Lives in [place] with son.      2 children, 2 grandchildren             Review of Systems  Constitutional: Positive for fatigue. Negative for fever.  Respiratory: Positive for cough and shortness of breath.   Neurological: Positive for weakness.   Objective:  BP 117/71 (BP  Location: Left Arm, Patient Position: Sitting, Cuff Size: Normal)   Pulse 76   Temp 97.5 F (36.4 C) (Oral)   Resp 16   Wt 160 lb (72.6 kg)   SpO2 (!) 84%   BMI 25.06 kg/m   BP/Weight 03/03/2016 02/26/2016 40/98/1191  Systolic BP 478 295 621  Diastolic BP 71 72 85  Wt. (Lbs) 160 158.29 158  BMI 25.06 24.79 24.75   Physical Exam  Constitutional:  Chronically ill-appearing male in no acute distress.  Cardiovascular: Normal rate and regular rhythm.   Pulmonary/Chest:  Increased WOB noted.  Decreased breath sounds throughout.  Neurological: He is alert.  Skin: No rash noted.  Psychiatric: He has a normal mood and affect.  Vitals reviewed.  Lab Results  Component Value Date   WBC 9.0 11/09/2015   HGB 10.8 (L) 11/09/2015   HCT 32.7 (L) 11/09/2015   PLT 280 11/09/2015   GLUCOSE 125 (H) 11/09/2015   CHOL 168 06/19/2014   TRIG 143.0 06/19/2014   HDL 69.50 06/19/2014   LDLCALC 70 06/19/2014   ALT 11 (L) 11/09/2015   AST 20 11/09/2015   NA 137 11/09/2015   K 3.6 11/09/2015   CL 105 11/09/2015   CREATININE 1.10 02/09/2016   BUN 22 (H) 11/09/2015   CO2  27 11/09/2015   TSH 1.41 06/07/2011   PSA 0.75 06/19/2014   MICROALBUR 2.3 (H) 06/19/2014   Assessment & Plan:   Problem List Items Addressed This Visit    Iron deficiency anemia   Relevant Orders   CBC   Iron, TIBC and Ferritin Panel   Hyperlipidemia    Has been stable. Lipid panel today. Continue tricor.      Relevant Orders   Lipid panel   Essential hypertension - Primary    Stable. Continue atenolol. Labs today.      Relevant Orders   Comprehensive metabolic panel   Dry skin    Patient complaining of rash. No rash visualized on exam today.       COPD, severe ([place]) (Chronic)    Severe, end stage. Patient does not seem to understand the fact that his advanced pulmonary disease is not going to improve and that is the primary reason that he is fatigue and unable to do things without significant  dyspnea. Patient requested an increase in his prednisone and I informed him that he should discuss this with his oncologist. He does not appear to be having an acute exacerbation and therefore this is likely to be a little benefit.       Other Visit Diagnoses    Current chronic use of systemic steroids       Relevant Orders   Hemoglobin A1c   Screening for HIV (human immunodeficiency virus)       Relevant Orders   HIV antibody   Need for hepatitis C screening test       Relevant Orders   Hepatitis C antibody     Follow-up: Return in about 3 months (around 06/01/2016).  [place]

## 2016-03-03 NOTE — Assessment & Plan Note (Signed)
Patient complaining of rash. No rash visualized on exam today.

## 2016-03-03 NOTE — Assessment & Plan Note (Signed)
Stable. Continue atenolol. Labs today.

## 2016-03-04 LAB — HEPATITIS C ANTIBODY: HCV AB: NEGATIVE

## 2016-03-04 LAB — IRON,TIBC AND FERRITIN PANEL
%SAT: 7 % — ABNORMAL LOW (ref 15–60)
FERRITIN: 7 ng/mL — AB (ref 20–380)
Iron: 38 ug/dL — ABNORMAL LOW (ref 50–180)
TIBC: 554 ug/dL — ABNORMAL HIGH (ref 250–425)

## 2016-03-04 LAB — HIV ANTIBODY (ROUTINE TESTING W REFLEX): HIV: NONREACTIVE

## 2016-05-20 ENCOUNTER — Encounter: Payer: Self-pay | Admitting: *Deleted

## 2016-07-12 ENCOUNTER — Other Ambulatory Visit: Payer: Self-pay | Admitting: Family Medicine

## 2016-08-19 ENCOUNTER — Ambulatory Visit: Payer: Medicare Other

## 2016-08-24 ENCOUNTER — Inpatient Hospital Stay: Payer: Medicare Other | Admitting: Internal Medicine

## 2016-08-24 ENCOUNTER — Ambulatory Visit: Payer: Medicare Other | Attending: Radiation Oncology | Admitting: Radiation Oncology

## 2016-09-06 ENCOUNTER — Ambulatory Visit
Admission: RE | Admit: 2016-09-06 | Discharge: 2016-09-06 | Disposition: A | Discharge status: Home / Self Care | Payer: Medicare Other | Source: Ambulatory Visit | Attending: Internal Medicine | Admitting: Internal Medicine

## 2016-09-06 DIAGNOSIS — C3412 Malignant neoplasm of upper lobe, left bronchus or lung: Secondary | ICD-10-CM | POA: Insufficient documentation

## 2016-09-06 DIAGNOSIS — I2510 Atherosclerotic heart disease of native coronary artery without angina pectoris: Secondary | ICD-10-CM | POA: Insufficient documentation

## 2016-09-06 DIAGNOSIS — I700 Atherosclerosis of aorta: Secondary | ICD-10-CM | POA: Diagnosis not present

## 2016-09-06 DIAGNOSIS — J439 Emphysema, unspecified: Secondary | ICD-10-CM | POA: Diagnosis not present

## 2016-09-06 DIAGNOSIS — R911 Solitary pulmonary nodule: Secondary | ICD-10-CM | POA: Diagnosis not present

## 2016-09-07 ENCOUNTER — Telehealth: Payer: Self-pay | Admitting: Family Medicine

## 2016-09-07 NOTE — Telephone Encounter (Signed)
AWV on 09/15/16 cancelled due to Denisa out of office. LVM for pt to call back and reschedule. Informed pt in message he still has appt with Dr. Lacinda Axon at 10:15am but he will not have AWV done on this date.  Ebony Hail call back [phone]

## 2016-09-08 ENCOUNTER — Ambulatory Visit: Payer: Medicare Other | Admitting: Hematology and Oncology

## 2016-09-09 ENCOUNTER — Ambulatory Visit: Payer: Medicare Other | Admitting: Radiation Oncology

## 2016-09-11 NOTE — Progress Notes (Signed)
[facility] NOTE  Patient Care Team: Coral Spikes, DO as PCP - General (Family Medicine) Allyne Gee, MD (Pulmonary Disease)  CHIEF COMPLAINTS/PURPOSE OF CONSULTATION:   Oncology History   # April 2016-LUL LUNG CA [clinical; no Bx sec to high risk] LUL LUNG NODULE [25mm]; MAY 2017- 70mm; PET avid- no distant mets s/p SBRT [end of June 2017];   # NOV 22nd CT scan- Improved LUL nodule  # COPD- home O2 [Dr.Saadat Khan]     Primary cancer of left upper lobe of lung ([place])   11/09/2015 Initial Diagnosis    Primary cancer of left upper lobe of lung ([place])        HISTORY OF PRESENTING ILLNESS:  Peter Johnston 76 y.o.  male long-standing history of smoking who unfortunately continues to smoke; also history of COPD on 2-3 L of O2 left upper lobe lung nodule growing in size status post SB RT end of June 2017 is here for follow-up/ review the results of the CT scan.   Patient was last seen by Dr. Rogue Bussing on 02/24/2016.  At that time, he had chronic shortness of breath on home O2. He had a chronic cough especially the morning. He denied hemoptysis. He continued on 2-3 L of oxygen. He denied any weight loss. He denied any headaches. He denied any chest pain.  During the interim, he notes no new symptoms.  He notes no AC in his car.  He notes degenerative disk disease.  Chest CT on 09/06/2016 revealed the previously treated left upper lobe pulmonary nodule appeared slightly larger than prior examinations measuring 13 x 8 mm (previously 10 x 9 mm). This may simply reflect slight differences in respiration and the orientation of the lesion with respect to the axial imaging plane close attention on followup studies is recommended to exclude true growth.  There was diffuse bronchial wall thickening with severe bullous emphysema with imaging findings compatible with COPD.  ROS: A complete 10 point review of system is done which is negative except mentioned above in history of  present illness  MEDICAL HISTORY:  Past Medical History:  Diagnosis Date  . Arthritis   . Asthma   . COPD (chronic obstructive pulmonary disease) ([place])   . Hypertension   . IDA (iron deficiency anemia)   . Lung mass     SURGICAL HISTORY: Past Surgical History:  Procedure Laterality Date  . ABDOMINAL HERNIA REPAIR  1995   Dr. Rochel Brome  . [place]  2011, 2012   x 2- Dr. Elvina Mattes    SOCIAL HISTORY: retired with [employer]; [place]; with girl friend; and son.  Social History   Social History  . Marital status: Widowed    Spouse name: N/A  . Number of children: 2  . Years of education: N/A   Occupational History  . Retired     Social History Main Topics  . Smoking status: Former Smoker    Packs/day: 2.00    Years: 63.00    Types: Cigarettes    Quit date: 09/13/2015  . Smokeless tobacco: Never Used     Comment: Stopped smoking cigarettes Sunday and uses the vapor e-cig to help with cravings  . Alcohol use 0.0 oz/week     Comment: 2-3 beers at night  . Drug use: No  . Sexual activity: No   Other Topics Concern  . Not on file   Social History Narrative   Lives in [place] with son.  2 children, 2 grandchildren             FAMILY HISTORY: Family History  Problem Relation Age of Onset  . Stroke Father   . Heart disease Father   . Hypertension Father   . Heart disease Brother 24    ALLERGIES:  is allergic to tamsulosin; gold-containing drug products; hydrochlorothiazide; and sulfa antibiotics.  MEDICATIONS:  Current Outpatient Prescriptions  Medication Sig Dispense Refill  . allopurinol (ZYLOPRIM) 300 MG tablet Take 300 mg by mouth daily.    Marland Kitchen amoxicillin (AMOXIL) 500 MG capsule Take 1 capsule (500 mg total) by mouth 3 (three) times daily. 30 capsule 0  . atenolol (TENORMIN) 25 MG tablet Take 1 tablet (25 mg total) by mouth daily. 90 tablet 3  . ENBREL 50 MG/ML injection 50 mg once a week.     . fenofibrate (TRICOR) 145 MG tablet TAKE  1 TABLET DAILY 90 tablet 3  . Fluticasone Furoate-Vilanterol (BREO ELLIPTA) 100-25 MCG/INH AEPB Inhale 1 puff into the lungs daily. 60 each 6  . furosemide (LASIX) 20 MG tablet Take by mouth.    . hydroxychloroquine (PLAQUENIL) 200 MG tablet Take 200 mg by mouth daily.    Marland Kitchen ipratropium-albuterol (DUONEB) 0.5-2.5 (3) MG/3ML SOLN Inhale 3 mLs into the lungs every 4 (four) hours as needed. 90 mL 3  . omeprazole (PRILOSEC) 40 MG capsule TAKE 1 CAPSULE DAILY 90 capsule 3  . predniSONE (DELTASONE) 5 MG tablet Take 5 mg by mouth daily.     . theophylline (UNIPHYL) 400 MG 24 hr tablet     . traMADol (ULTRAM) 50 MG tablet TAKE ONE TABLET BY MOUTH EVERY 12 HOURS AS NEEDED 60 tablet 0  . TRAVATAN Z 0.004 % SOLN ophthalmic solution     . traZODone (DESYREL) 50 MG tablet Take 0.5-1 tablets (25-50 mg total) by mouth at bedtime as needed for sleep. 30 tablet 3  . ANORO ELLIPTA 62.5-25 MCG/INH AEPB     . brimonidine (ALPHAGAN) 0.2 % ophthalmic solution Place 1 drop into both eyes 2 (two) times daily.    Marland Kitchen latanoprost (XALATAN) 0.005 % ophthalmic solution Place 1 drop into both eyes at bedtime.     No current facility-administered medications for this visit.       Marland Kitchen  PHYSICAL EXAMINATION: ECOG PERFORMANCE STATUS: 2 - Symptomatic, <50% confined to bed  Vitals:   09/12/16 1519  BP: 101/67  Pulse: 77  Resp: 18  Temp: 99 F (37.2 C)   Filed Weights   09/12/16 1519  Weight: 175 lb (79.4 kg)    GENERAL:  Well developed, well nourished, gentleman sitting comfortably in the exam room in no acute distress. MENTAL STATUS:  Alert and oriented to person, place and time. HEAD:  Pearline Cables hair.  Normocephalic, atraumatic, face symmetric, no Cushingoid features. EYES:  Blue eyes.  Pupils equal round and reactive to light and accomodation.  No conjunctivitis or scleral icterus. ENT:  Oropharynx clear without lesion.  Tongue normal. Dentures.  Mucous membranes dry.  RESPIRATORY:  Decreased respiratory excursion.   Clear to auscultation without rales, wheezes or rhonchi. CARDIOVASCULAR:  Regular rate and rhythm without murmur, rub or gallop. ABDOMEN:  Soft, non-tender, with active bowel sounds, and no hepatosplenomegaly.  No masses. SKIN:  No rashes, ulcers or lesions. EXTREMITIES: No edema, no skin discoloration or tenderness.  No palpable cords. LYMPH NODES: No palpable cervical, supraclavicular, axillary or inguinal adenopathy  NEUROLOGICAL: Unremarkable. PSYCH:  Appropriate.    LABORATORY DATA:  I have  reviewed the data as listed Lab Results  Component Value Date   WBC 10.7 (H) 03/03/2016   HGB 9.2 (L) 03/03/2016   HCT 29.6 (L) 03/03/2016   MCV 73.6 (L) 03/03/2016   PLT 304.0 03/03/2016    Recent Labs  10/16/15 1119 11/09/15 1058 02/09/16 1034 03/03/16 1354  NA 138 137  --  141  K 4.5 3.6  --  4.3  CL 101 105  --  102  CO2 30 27  --  33*  GLUCOSE 100* 125*  --  107*  BUN 27* 22*  --  20  CREATININE 1.02 1.01 1.10 1.05  CALCIUM 9.7 8.8*  --  9.5  GFRNONAA  --  >60  --   --   GFRAA  --  >60  --   --   PROT 7.8 7.3  --  7.2  ALBUMIN 3.9 3.7  --  3.7  AST 15 20  --  17  ALT 11 11*  --  10  ALKPHOS 38* 38  --  41  BILITOT 0.4 0.4  --  0.3    RADIOGRAPHIC STUDIES: I have personally reviewed the radiological images as listed and agreed with the findings in the report. Ct Chest Wo Contrast  Result Date: 09/06/2016 CLINICAL DATA:  76 year old male with history of left upper lobe lung cancer. Followup study. EXAM: CT CHEST WITHOUT CONTRAST TECHNIQUE: Multidetector CT imaging of the chest was performed following the standard protocol without IV contrast. COMPARISON:  Chest CT 02/09/2016. FINDINGS: Cardiovascular: Heart size is mildly enlarged. There is no significant pericardial fluid, thickening or pericardial calcification. There is aortic atherosclerosis, as well as atherosclerosis of the great vessels of the mediastinum and the coronary arteries, including calcified atherosclerotic  plaque in the left main, left anterior descending, left circumflex and right coronary arteries. Mild dilatation of the pulmonic trunk (3.6 cm). Mediastinum/Nodes: No pathologically enlarged mediastinal or hilar lymph nodes. Please note that accurate exclusion of hilar adenopathy is limited on noncontrast CT scans. Esophagus is unremarkable in appearance. No axillary lymphadenopathy. Lungs/Pleura: Previously noted left upper lobe pulmonary nodule appears slightly larger than prior studies measuring 8 x 13 mm on today's examination (axial image 68 of series 3). The patchy multifocal peribronchovascular airspace consolidation seen on the prior study has resolved, indicative of an infectious/inflammatory process on the prior examination. No acute consolidative airspace disease or pleural effusions are noted on today's examination. Scattered areas of bibasilar scarring are noted. Diffuse bronchial wall thickening with severe bullous emphysema, most pronounced throughout the upper lobes of lungs bilaterally. Upper Abdomen: Aortic atherosclerosis. Musculoskeletal: There are no aggressive appearing lytic or blastic lesions noted in the visualized portions of the skeleton. IMPRESSION: 1. Previously treated left upper lobe pulmonary nodule appears slightly larger than prior examinations measuring 13 x 8 mm on today's study. This may simply reflect slight differences in respiration and the orientation of the lesion with. Respect to the axial imaging plane close attention on followup studies is recommended to exclude true growth. 2. Diffuse bronchial wall thickening with severe bullous emphysema; imaging findings compatible with the reported clinical history of COPD. 3. There is associated dilatation of the pulmonic trunk (3.6 cm in diameter), suggestive of underlying pulmonary arterial hypertension. 4. Aortic atherosclerosis, in addition to left main and 3 vessel coronary artery disease. Assessment for potential risk factor  modification, dietary therapy or pharmacologic therapy may be warranted, if clinically indicated. 5. Additional incidental findings, as above. Aortic Atherosclerosis (ICD10-I70.0) and Emphysema (ICD10-J43.9). Electronically Signed  By: Vinnie Langton M.D.   On: 09/06/2016 16:47    ASSESSMENT & PLAN:   Primary cancer of left upper lobe of lung ([place]) # LEFT UPPER LOBE LUNG CA- STAGE I;  suspicious for malignancy- especially since it is growing compared to previous scan in April 2016. S/p SBRT June 26th. NOV 2017- improvement noted  Review chest CT.  LUL nodule slightly larger (3 mm; ? due to orientation/slice).  Discuss continued surveillance.  Schedule chest CT on 03/08/2017  RTC after CT for MD (Dr Rogue Bussing), labs (CBC with diff, CMP), and review of CT.    Cc: Dr.Cook.     I reviewed the images myself and with the patient in detail.    Lequita Asal, MD 09/12/2016 3:26 PM

## 2016-09-12 ENCOUNTER — Encounter: Payer: Self-pay | Admitting: Hematology and Oncology

## 2016-09-12 ENCOUNTER — Inpatient Hospital Stay: Payer: Medicare Other | Attending: Hematology and Oncology | Admitting: Hematology and Oncology

## 2016-09-12 VITALS — BP 101/67 | HR 77 | Temp 99.0°F | Resp 18 | Wt 175.0 lb

## 2016-09-12 DIAGNOSIS — R0602 Shortness of breath: Secondary | ICD-10-CM

## 2016-09-12 DIAGNOSIS — F17210 Nicotine dependence, cigarettes, uncomplicated: Secondary | ICD-10-CM

## 2016-09-12 DIAGNOSIS — I700 Atherosclerosis of aorta: Secondary | ICD-10-CM

## 2016-09-12 DIAGNOSIS — D509 Iron deficiency anemia, unspecified: Secondary | ICD-10-CM | POA: Diagnosis not present

## 2016-09-12 DIAGNOSIS — J449 Chronic obstructive pulmonary disease, unspecified: Secondary | ICD-10-CM

## 2016-09-12 DIAGNOSIS — Z923 Personal history of irradiation: Secondary | ICD-10-CM | POA: Insufficient documentation

## 2016-09-12 DIAGNOSIS — C3412 Malignant neoplasm of upper lobe, left bronchus or lung: Secondary | ICD-10-CM | POA: Insufficient documentation

## 2016-09-12 DIAGNOSIS — I10 Essential (primary) hypertension: Secondary | ICD-10-CM | POA: Diagnosis not present

## 2016-09-12 DIAGNOSIS — M129 Arthropathy, unspecified: Secondary | ICD-10-CM | POA: Diagnosis not present

## 2016-09-12 DIAGNOSIS — Z79899 Other long term (current) drug therapy: Secondary | ICD-10-CM | POA: Insufficient documentation

## 2016-09-12 NOTE — Progress Notes (Signed)
Patient here today for CT results.  Continues to have SOB.  O2 saturation 88% on 3L O2.

## 2016-09-15 ENCOUNTER — Ambulatory Visit: Payer: Medicare Other

## 2016-09-15 ENCOUNTER — Encounter: Payer: Medicare Other | Admitting: Family Medicine

## 2016-09-29 ENCOUNTER — Other Ambulatory Visit: Payer: Self-pay

## 2016-09-30 NOTE — Addendum Note (Signed)
Addended by: Johna Sheriff on: 09/30/2016 11:58 AM   Modules accepted: Orders

## 2016-10-03 MED ORDER — OMEPRAZOLE 40 MG PO CPDR: 40.0000 mg | DELAYED_RELEASE_CAPSULE | Freq: Every day | ORAL | 0 refills | Status: DC

## 2016-10-03 NOTE — Addendum Note (Signed)
Addended by: Johna Sheriff on: 10/03/2016 05:10 PM   Modules accepted: Orders

## 2016-10-04 ENCOUNTER — Telehealth: Payer: Self-pay | Admitting: *Deleted

## 2016-10-04 NOTE — Telephone Encounter (Signed)
Medication Refill requested for : omeprazole  [facility]  Return Contact :  [phone]

## 2016-10-04 NOTE — Telephone Encounter (Signed)
Called and advised patient refill sent in

## 2016-10-07 ENCOUNTER — Ambulatory Visit (INDEPENDENT_AMBULATORY_CARE_PROVIDER_SITE_OTHER): Payer: Medicare Other | Admitting: Family Medicine

## 2016-10-07 ENCOUNTER — Encounter: Payer: Self-pay | Admitting: Family Medicine

## 2016-10-07 VITALS — BP 120/58 | HR 72 | Temp 98.3°F | Resp 16 | Ht 67.0 in | Wt 175.0 lb

## 2016-10-07 DIAGNOSIS — J441 Chronic obstructive pulmonary disease with (acute) exacerbation: Secondary | ICD-10-CM | POA: Diagnosis not present

## 2016-10-07 DIAGNOSIS — Z125 Encounter for screening for malignant neoplasm of prostate: Secondary | ICD-10-CM | POA: Diagnosis not present

## 2016-10-07 DIAGNOSIS — D509 Iron deficiency anemia, unspecified: Secondary | ICD-10-CM | POA: Diagnosis not present

## 2016-10-07 DIAGNOSIS — E785 Hyperlipidemia, unspecified: Secondary | ICD-10-CM | POA: Diagnosis not present

## 2016-10-07 DIAGNOSIS — M549 Dorsalgia, unspecified: Secondary | ICD-10-CM

## 2016-10-07 DIAGNOSIS — R6889 Other general symptoms and signs: Secondary | ICD-10-CM

## 2016-10-07 DIAGNOSIS — R739 Hyperglycemia, unspecified: Secondary | ICD-10-CM

## 2016-10-07 DIAGNOSIS — M1A00X0 Idiopathic chronic gout, unspecified site, without tophus (tophi): Secondary | ICD-10-CM | POA: Diagnosis not present

## 2016-10-07 DIAGNOSIS — R600 Localized edema: Secondary | ICD-10-CM | POA: Diagnosis not present

## 2016-10-07 DIAGNOSIS — I10 Essential (primary) hypertension: Secondary | ICD-10-CM

## 2016-10-07 LAB — COMPREHENSIVE METABOLIC PANEL
ALBUMIN: 3.5 g/dL (ref 3.5–5.2)
ALK PHOS: 39 U/L (ref 39–117)
ALT: 10 U/L (ref 0–53)
AST: 15 U/L (ref 0–37)
BILIRUBIN TOTAL: 0.3 mg/dL (ref 0.2–1.2)
BUN: 25 mg/dL — AB (ref 6–23)
CO2: 36 mEq/L — ABNORMAL HIGH (ref 19–32)
CREATININE: 1.03 mg/dL (ref 0.40–1.50)
Calcium: 9.2 mg/dL (ref 8.4–10.5)
Chloride: 98 mEq/L (ref 96–112)
GFR: 74.31 mL/min (ref >60.00)
Glucose, Bld: 107 mg/dL — ABNORMAL HIGH (ref 70–99)
Potassium: 4.1 mEq/L (ref 3.5–5.1)
SODIUM: 141 meq/L (ref 135–145)
TOTAL PROTEIN: 6.4 g/dL (ref 6.0–8.3)

## 2016-10-07 LAB — CBC
HCT: 27.1 % — ABNORMAL LOW (ref 39.0–52.0)
Hemoglobin: 8.2 g/dL — ABNORMAL LOW (ref 13.0–17.0)
MCHC: 30.3 g/dL (ref 30.0–36.0)
MCV: 81 fl (ref 78.0–100.0)
PLATELETS: 250 10*3/uL (ref 150.0–400.0)
RBC: 3.35 Mil/uL — AB (ref 4.22–5.81)
RDW: 17 % — ABNORMAL HIGH (ref 11.5–15.5)
WBC: 12.6 10*3/uL — ABNORMAL HIGH (ref 4.0–10.5)

## 2016-10-07 LAB — LIPID PANEL
Cholesterol: 138 mg/dL (ref 0–200)
HDL: 54.3 mg/dL (ref >39.00)
LDL Cholesterol: 53 mg/dL (ref 0–99)
NonHDL: 83.21
Total CHOL/HDL Ratio: 3
Triglycerides: 151 mg/dL — ABNORMAL HIGH (ref 0.0–149.0)
VLDL: 30.2 mg/dL (ref 0.0–40.0)

## 2016-10-07 LAB — HEMOGLOBIN A1C: Hgb A1c MFr Bld: 5.6 % (ref 4.6–6.5)

## 2016-10-07 LAB — PSA: PSA: 0.42 ng/mL (ref 0.10–4.00)

## 2016-10-07 LAB — TSH: TSH: 1.23 u[IU]/mL (ref 0.35–4.50)

## 2016-10-07 LAB — URIC ACID: URIC ACID, SERUM: 3.8 mg/dL — AB (ref 4.0–7.8)

## 2016-10-07 MED ORDER — AZITHROMYCIN 250 MG PO TABS: ORAL_TABLET | ORAL | 0 refills | Status: DC

## 2016-10-07 NOTE — Patient Instructions (Addendum)
Elevated your legs and wear compression stockings.  Antibiotic as prescribed.  See pulmonary regarding your continued symptoms.  Follow up in 3 months.  Take care  Dr. Lacinda Axon

## 2016-10-09 DIAGNOSIS — M549 Dorsalgia, unspecified: Secondary | ICD-10-CM | POA: Insufficient documentation

## 2016-10-09 DIAGNOSIS — J441 Chronic obstructive pulmonary disease with (acute) exacerbation: Secondary | ICD-10-CM | POA: Insufficient documentation

## 2016-10-09 DIAGNOSIS — R600 Localized edema: Secondary | ICD-10-CM | POA: Insufficient documentation

## 2016-10-09 NOTE — Progress Notes (Signed)
Subjective:  Patient ID: Peter Johnston, male    DOB: [birth year]  Age: 76 y.o. MRN: [id number]  CC: Multiple complaints  HPI:  76 year old male with an extensive PMH including CAD, HTN, Severe COPD on chronic O2, BPH, Anemia, Lung cancer presents with several complaints.  Cough, SOB  Patient reports worsening SOB and productive cough.  Ongoing.  Has severe COPD.  Requesting antibiotic and steroid.  Back pain  Ongoing.   Middle and low back.  No known inciting factor.  No know relieving factors.  No injury.  Patient is sedentary due to severe COPD.  Lower extremity edema  Chronic, and worsening.  Has severe baseline SOB.  No improvement with lasix.  Wants to discuss today.  Social Hx   Social History   Social History  . Marital status: Widowed    Spouse name: N/A  . Number of children: 2  . Years of education: N/A   Occupational History  . Retired     Social History Main Topics  . Smoking status: Former Smoker    Packs/day: 2.00    Years: 63.00    Types: Cigarettes    Quit date: 09/13/2015  . Smokeless tobacco: Never Used     Comment: Stopped smoking cigarettes Sunday and uses the vapor e-cig to help with cravings  . Alcohol use 0.0 oz/week     Comment: 2-3 beers at night  . Drug use: No  . Sexual activity: No   Other Topics Concern  . None   Social History Narrative   Lives in [place] with son.      2 children, 2 grandchildren             Review of Systems  Constitutional: Negative for fever.  Respiratory: Positive for cough and shortness of breath.   Cardiovascular: Positive for leg swelling.  Musculoskeletal: Positive for back pain.    Objective:  BP (!) 120/58   Pulse 72   Temp 98.3 F (36.8 C) (Oral)   Resp 16   Ht 5\' 7"  (1.702 m)   Wt 175 lb (79.4 kg)   SpO2 90%   BMI 27.41 kg/m   BP/Weight 10/07/2016 09/12/2016 93/10/1015  Systolic BP 510 258 527  Diastolic BP 58 67 71  Wt. (Lbs) 175 175 160  BMI 27.41  27.41 25.06    Physical Exam  Constitutional: He is oriented to person, place, and time.  Chronically ill appearing male in NAD.  Cardiovascular: Normal rate and regular rhythm.   1-2 + pitting LE edema.  Pulmonary/Chest: Effort normal. No respiratory distress.  Wheezing noted.   Neurological: He is alert and oriented to person, place, and time.  Psychiatric: He has a normal mood and affect.  Vitals reviewed.   Lab Results  Component Value Date   WBC 12.6 (H) 10/07/2016   HGB 8.2 Repeated and verified X2. (L) 10/07/2016   HCT 27.1 (L) 10/07/2016   PLT 250.0 10/07/2016   GLUCOSE 107 (H) 10/07/2016   CHOL 138 10/07/2016   TRIG 151.0 (H) 10/07/2016   HDL 54.30 10/07/2016   LDLCALC 53 10/07/2016   ALT 10 10/07/2016   AST 15 10/07/2016   NA 141 10/07/2016   K 4.1 10/07/2016   CL 98 10/07/2016   CREATININE 1.03 10/07/2016   BUN 25 (H) 10/07/2016   CO2 36 (H) 10/07/2016   TSH 1.23 10/07/2016   PSA 0.42 10/07/2016   HGBA1C 5.6 10/07/2016   MICROALBUR 2.3 (H) 06/19/2014  Assessment & Plan:   Problem List Items Addressed This Visit      Cardiovascular and Mediastinum   Essential hypertension     Respiratory   COPD exacerbation ([place]) - Primary    Treating with azithromycin. He is on chronic steroids. Patient to discuss increase with Pulm.      Relevant Medications   predniSONE (DELTASONE) 10 MG tablet   azithromycin (ZITHROMAX) 250 MG tablet     Other   Back pain    Likely MSK in origin secondary to being sedentary. Supportive care.       Relevant Medications   predniSONE (DELTASONE) 10 MG tablet   Gout   Relevant Orders   Uric acid (Completed)   Hyperlipidemia   Relevant Orders   Lipid panel (Completed)   Iron deficiency anemia   Relevant Orders   CBC (Completed)   Comprehensive metabolic panel (Completed)   Lower extremity edema    New problem. Suspect this is predominantly due to venous stasis. No significant response to lasix. Advised  compression, elevation.       Other Visit Diagnoses    Elevated blood sugar       Relevant Orders   Hemoglobin A1c (Completed)   Prostate cancer screening       Relevant Orders   PSA (Completed)   Cold intolerance       Relevant Orders   TSH (Completed)      Meds ordered this encounter  Medications  . predniSONE (DELTASONE) 10 MG tablet    Sig: Take 10 mg by mouth daily with breakfast.  . azithromycin (ZITHROMAX) 250 MG tablet    Sig: 2 tablets on Day 1 then 1 tablet daily on Days 2-5.    Dispense:  6 tablet    Refill:  0    Follow-up: 3 months  [place] DO [facility]

## 2016-10-09 NOTE — Assessment & Plan Note (Signed)
Likely MSK in origin secondary to being sedentary. Supportive care.

## 2016-10-09 NOTE — Assessment & Plan Note (Signed)
Treating with azithromycin. He is on chronic steroids. Patient to discuss increase with Pulm.

## 2016-10-09 NOTE — Assessment & Plan Note (Signed)
New problem. Suspect this is predominantly due to venous stasis. No significant response to lasix. Advised compression, elevation.

## 2016-10-11 ENCOUNTER — Other Ambulatory Visit: Payer: Self-pay | Admitting: Family Medicine

## 2016-10-11 MED ORDER — AMOXICILLIN-POT CLAVULANATE 875-125 MG PO TABS: 1.0000 | ORAL_TABLET | Freq: Two times a day (BID) | ORAL | 0 refills | Status: DC

## 2016-10-14 ENCOUNTER — Telehealth: Payer: Self-pay

## 2016-10-14 NOTE — Telephone Encounter (Signed)
No. He has an oncologist. He just needs to call for follow up.

## 2016-10-14 NOTE — Telephone Encounter (Signed)
Patient calls wanting to know if referral has been made to oncology .  Please advise.

## 2016-10-17 ENCOUNTER — Other Ambulatory Visit: Payer: Self-pay | Admitting: *Deleted

## 2016-10-17 ENCOUNTER — Telehealth: Payer: Self-pay | Admitting: *Deleted

## 2016-10-17 ENCOUNTER — Other Ambulatory Visit: Payer: Self-pay | Admitting: Internal Medicine

## 2016-10-17 DIAGNOSIS — D509 Iron deficiency anemia, unspecified: Secondary | ICD-10-CM

## 2016-10-17 DIAGNOSIS — D500 Iron deficiency anemia secondary to blood loss (chronic): Secondary | ICD-10-CM

## 2016-10-17 NOTE — Telephone Encounter (Signed)
Per Dr Rogue Bussing, Venofer weekly times 4, CBC, Extra tube, IIBC, Ferr before first infusion. See MD on seconmd or third infusion

## 2016-10-17 NOTE — Telephone Encounter (Signed)
Message sent to schedulers to get pt on schedule

## 2016-10-17 NOTE — Progress Notes (Signed)
Lab orders entered by rn

## 2016-10-17 NOTE — Telephone Encounter (Signed)
Patient advised of below and verbalized understanding.  

## 2016-10-17 NOTE — Progress Notes (Signed)
[facility]/Venofer  - next available. Schedule pt for a total for 4 weekly IV venofer   Please have patient - see md either on the 2 or 3rd treatment of venofer [no labs when he sees md].

## 2016-10-17 NOTE — Progress Notes (Signed)
Discussed with Peter Johnston- Iron deficiency noted on labs from December 2017; most recent hemoglobin 8.2. Recommend repeating CBC and iron studies/ferritin. Start IV Venofer weekly 4; have pt see me with 2 or 3rd treatment [no labs when he sees me].

## 2016-10-17 NOTE — Telephone Encounter (Signed)
Patient called asking for iron treatments, states he has Lung cancer and now has IDA and wants treatment for it. He was seen in June and does not have a return appt until December. Please advise

## 2016-10-17 NOTE — Addendum Note (Signed)
Addended by: Betti Cruz on: 10/17/2016 04:28 PM   Modules accepted: Orders

## 2016-10-17 NOTE — Telephone Encounter (Signed)
Patient informed of orders and advised that he will get a call from schedulers with appts

## 2016-10-21 ENCOUNTER — Inpatient Hospital Stay: Payer: Medicare Other

## 2016-10-21 ENCOUNTER — Inpatient Hospital Stay: Payer: Medicare Other | Attending: Internal Medicine

## 2016-10-21 ENCOUNTER — Other Ambulatory Visit: Payer: Self-pay | Admitting: Internal Medicine

## 2016-10-21 VITALS — BP 116/67 | HR 71 | Temp 98.1°F | Resp 20

## 2016-10-21 DIAGNOSIS — D509 Iron deficiency anemia, unspecified: Secondary | ICD-10-CM

## 2016-10-21 DIAGNOSIS — D500 Iron deficiency anemia secondary to blood loss (chronic): Secondary | ICD-10-CM

## 2016-10-21 LAB — CBC WITH DIFFERENTIAL/PLATELET
BASOS PCT: 1 %
Basophils Absolute: 0.1 10*3/uL (ref 0–0.1)
Eosinophils Absolute: 0.1 10*3/uL (ref 0–0.7)
Eosinophils Relative: 1 %
HEMATOCRIT: 25.5 % — AB (ref 40.0–52.0)
HEMOGLOBIN: 7.9 g/dL — AB (ref 13.0–18.0)
LYMPHS PCT: 18 %
Lymphs Abs: 1.9 10*3/uL (ref 1.0–3.6)
MCH: 24.4 pg — ABNORMAL LOW (ref 26.0–34.0)
MCHC: 31.2 g/dL — AB (ref 32.0–36.0)
MCV: 78.1 fL — AB (ref 80.0–100.0)
MONO ABS: 0.9 10*3/uL (ref 0.2–1.0)
MONOS PCT: 9 %
NEUTROS ABS: 7.6 10*3/uL — AB (ref 1.4–6.5)
NEUTROS PCT: 71 %
Platelets: 275 10*3/uL (ref 150–440)
RBC: 3.26 MIL/uL — ABNORMAL LOW (ref 4.40–5.90)
RDW: 18.1 % — ABNORMAL HIGH (ref 11.5–14.5)
WBC: 10.6 10*3/uL (ref 3.8–10.6)

## 2016-10-21 LAB — FERRITIN: FERRITIN: 8 ng/mL — AB (ref 24–336)

## 2016-10-21 LAB — IRON AND TIBC
Iron: 14 ug/dL — ABNORMAL LOW (ref 45–182)
SATURATION RATIOS: 2 % — AB (ref 17.9–39.5)
TIBC: 616 ug/dL — AB (ref 250–450)
UIBC: 602 ug/dL

## 2016-10-21 LAB — SAMPLE TO BLOOD BANK

## 2016-10-21 MED ORDER — IRON SUCROSE 20 MG/ML IV SOLN
200.0000 mg | Freq: Once | INTRAVENOUS | Status: AC
  Administered 2016-10-21: 200 mg via INTRAVENOUS
  Filled 2016-10-21: qty 10

## 2016-10-21 MED ORDER — SODIUM CHLORIDE 0.9 % IV SOLN
Freq: Once | INTRAVENOUS | Status: AC
  Administered 2016-10-21: 14:00:00 via INTRAVENOUS
  Filled 2016-10-21: qty 1000

## 2016-10-21 MED ORDER — SODIUM CHLORIDE 0.9 % IV SOLN: 200.0000 mg | Freq: Once | INTRAVENOUS | Status: DC

## 2016-10-21 NOTE — Telephone Encounter (Signed)
Rescheduled to 11/16/16

## 2016-10-26 ENCOUNTER — Other Ambulatory Visit: Payer: Self-pay | Admitting: Hematology and Oncology

## 2016-10-28 ENCOUNTER — Inpatient Hospital Stay: Payer: Medicare Other | Attending: Internal Medicine

## 2016-10-28 DIAGNOSIS — J449 Chronic obstructive pulmonary disease, unspecified: Secondary | ICD-10-CM | POA: Insufficient documentation

## 2016-10-28 DIAGNOSIS — I10 Essential (primary) hypertension: Secondary | ICD-10-CM | POA: Insufficient documentation

## 2016-10-28 DIAGNOSIS — Z87891 Personal history of nicotine dependence: Secondary | ICD-10-CM | POA: Insufficient documentation

## 2016-10-28 DIAGNOSIS — Z9981 Dependence on supplemental oxygen: Secondary | ICD-10-CM | POA: Diagnosis not present

## 2016-10-28 DIAGNOSIS — D509 Iron deficiency anemia, unspecified: Secondary | ICD-10-CM | POA: Insufficient documentation

## 2016-10-28 DIAGNOSIS — D500 Iron deficiency anemia secondary to blood loss (chronic): Secondary | ICD-10-CM

## 2016-10-28 DIAGNOSIS — F17210 Nicotine dependence, cigarettes, uncomplicated: Secondary | ICD-10-CM | POA: Diagnosis not present

## 2016-10-28 DIAGNOSIS — Z79899 Other long term (current) drug therapy: Secondary | ICD-10-CM | POA: Insufficient documentation

## 2016-10-28 DIAGNOSIS — Z923 Personal history of irradiation: Secondary | ICD-10-CM | POA: Insufficient documentation

## 2016-10-28 DIAGNOSIS — C3412 Malignant neoplasm of upper lobe, left bronchus or lung: Secondary | ICD-10-CM | POA: Insufficient documentation

## 2016-10-28 MED ORDER — SODIUM CHLORIDE 0.9 % IV SOLN
Freq: Once | INTRAVENOUS | Status: AC
Start: 2016-10-28 — End: 2016-10-28
  Administered 2016-10-28: 15:00:00 via INTRAVENOUS
  Filled 2016-10-28: qty 1000

## 2016-10-28 MED ORDER — IRON SUCROSE 20 MG/ML IV SOLN
200.0000 mg | Freq: Once | INTRAVENOUS | Status: AC
  Administered 2016-10-28: 200 mg via INTRAVENOUS
  Filled 2016-10-28: qty 10

## 2016-10-28 MED ORDER — SODIUM CHLORIDE 0.9 % IV SOLN: 200.0000 mg | Freq: Once | INTRAVENOUS | Status: DC

## 2016-11-04 ENCOUNTER — Inpatient Hospital Stay (HOSPITAL_BASED_OUTPATIENT_CLINIC_OR_DEPARTMENT_OTHER): Payer: Medicare Other | Admitting: Internal Medicine

## 2016-11-04 ENCOUNTER — Inpatient Hospital Stay: Payer: Medicare Other

## 2016-11-04 VITALS — BP 123/70 | HR 88 | Temp 97.2°F | Resp 16 | Wt 177.0 lb

## 2016-11-04 DIAGNOSIS — Z9981 Dependence on supplemental oxygen: Secondary | ICD-10-CM | POA: Diagnosis not present

## 2016-11-04 DIAGNOSIS — C3412 Malignant neoplasm of upper lobe, left bronchus or lung: Secondary | ICD-10-CM

## 2016-11-04 DIAGNOSIS — J449 Chronic obstructive pulmonary disease, unspecified: Secondary | ICD-10-CM

## 2016-11-04 DIAGNOSIS — I10 Essential (primary) hypertension: Secondary | ICD-10-CM | POA: Diagnosis not present

## 2016-11-04 DIAGNOSIS — Z923 Personal history of irradiation: Secondary | ICD-10-CM | POA: Diagnosis not present

## 2016-11-04 DIAGNOSIS — D500 Iron deficiency anemia secondary to blood loss (chronic): Secondary | ICD-10-CM

## 2016-11-04 DIAGNOSIS — Z87891 Personal history of nicotine dependence: Secondary | ICD-10-CM | POA: Diagnosis not present

## 2016-11-04 DIAGNOSIS — D509 Iron deficiency anemia, unspecified: Secondary | ICD-10-CM

## 2016-11-04 DIAGNOSIS — Z79899 Other long term (current) drug therapy: Secondary | ICD-10-CM | POA: Diagnosis not present

## 2016-11-04 DIAGNOSIS — F17210 Nicotine dependence, cigarettes, uncomplicated: Secondary | ICD-10-CM

## 2016-11-04 LAB — URINALYSIS, COMPLETE (UACMP) WITH MICROSCOPIC
BACTERIA UA: NONE SEEN
BILIRUBIN URINE: NEGATIVE
Glucose, UA: NEGATIVE mg/dL
Hgb urine dipstick: NEGATIVE
Ketones, ur: NEGATIVE mg/dL
Leukocytes, UA: NEGATIVE
NITRITE: NEGATIVE
PH: 7 (ref 5.0–8.0)
Protein, ur: NEGATIVE mg/dL
SPECIFIC GRAVITY, URINE: 1.019 (ref 1.005–1.030)

## 2016-11-04 MED ORDER — AMOXICILLIN 500 MG PO CAPS: 500.0000 mg | ORAL_CAPSULE | Freq: Three times a day (TID) | ORAL | 0 refills | Status: DC

## 2016-11-04 MED ORDER — SODIUM CHLORIDE 0.9 % IV SOLN
Freq: Once | INTRAVENOUS | Status: AC
  Administered 2016-11-04: 15:00:00 via INTRAVENOUS
  Filled 2016-11-04: qty 1000

## 2016-11-04 MED ORDER — IRON SUCROSE 20 MG/ML IV SOLN
200.0000 mg | Freq: Once | INTRAVENOUS | Status: AC
  Administered 2016-11-04: 200 mg via INTRAVENOUS
  Filled 2016-11-04: qty 10

## 2016-11-04 MED ORDER — PREDNISONE 20 MG PO TABS: ORAL_TABLET | ORAL | 0 refills | Status: DC

## 2016-11-04 NOTE — Progress Notes (Signed)
[facility] NOTE  Patient Care Team: Leone Haven, MD as PCP - General (Family Medicine) Allyne Gee, MD (Pulmonary Disease)  CHIEF COMPLAINTS/PURPOSE OF CONSULTATION:   Oncology History   # April 2016-LUL LUNG CA [clinical; no Bx sec to high risk] LUL LUNG NODULE [56mm]; MAY 2017- 46mm; PET avid- no distant mets s/p SBRT [end of June 2017];   # NOV 22nd CT scan- Improved LUL nodule  # COPD- home O2 [Dr.Saadat Khan]     Primary cancer of left upper lobe of lung ([place])   11/09/2015 Initial Diagnosis    Primary cancer of left upper lobe of lung ([place])        HISTORY OF PRESENTING ILLNESS:  Peter Johnston 76 y.o.  male long-standing history of smoking who unfortunately continues to smoke; also history of COPD on 2-3 L of O2 left upper lobe lung nodule growing in size status post SB RT end of June 2017 is here for follow-up.  Patient's last CT scan of the chest- June 2018- stable since last slightly increased left upper lobe lung nodule.  However in the interim patient noted to have severe iron deficiency anemia- hemoglobin 7.9; iron studies suggestive of iron deficiency. Patient continues to deny any blood in stools or black stools. Denies any nausea vomiting abdominal pain. Denies any new onset of constipation or diarrhea.  The last week or so patient noted to have worsening shortness of breath; cough with greenish phlegm. He is currently on prednisone 10 mg a day.  No hemoptysis. He continues to be on 2-3 L of oxygen. Denies any weight loss. Denies any headaches. No chest pain.   ROS: A complete 10 point review of system is done which is negative except mentioned above in history of present illness  MEDICAL HISTORY:  Past Medical History:  Diagnosis Date  . Arthritis   . Asthma   . COPD (chronic obstructive pulmonary disease) ([place])   . Hypertension   . IDA (iron deficiency anemia)   . Lung mass     SURGICAL HISTORY: Past Surgical History:   Procedure Laterality Date  . ABDOMINAL HERNIA REPAIR  1995   Dr. Rochel Brome  . [place]  2011, 2012   x 2- Dr. Elvina Mattes    SOCIAL HISTORY: retired with [employer]; [place]; with girl friend; and son.  Social History   Social History  . Marital status: Widowed    Spouse name: N/A  . Number of children: 2  . Years of education: N/A   Occupational History  . Retired     Social History Main Topics  . Smoking status: Former Smoker    Packs/day: 2.00    Years: 63.00    Types: Cigarettes    Quit date: 09/13/2015  . Smokeless tobacco: Never Used     Comment: Stopped smoking cigarettes Sunday and uses the vapor e-cig to help with cravings  . Alcohol use 0.0 oz/week     Comment: 2-3 beers at night  . Drug use: No  . Sexual activity: No   Other Topics Concern  . Not on file   Social History Narrative   Lives in [place] with son.      2 children, 2 grandchildren             FAMILY HISTORY: Family History  Problem Relation Age of Onset  . Stroke Father   . Heart disease Father   . Hypertension Father   . Heart  disease Brother 10    ALLERGIES:  is allergic to tamsulosin; gold-containing drug products; hydrochlorothiazide; and sulfa antibiotics.  MEDICATIONS:  Current Outpatient Prescriptions  Medication Sig Dispense Refill  . allopurinol (ZYLOPRIM) 300 MG tablet Take 300 mg by mouth daily.    Jearl Klinefelter ELLIPTA 62.5-25 MCG/INH AEPB     . atenolol (TENORMIN) 25 MG tablet Take 1 tablet (25 mg total) by mouth daily. 90 tablet 3  . brimonidine (ALPHAGAN) 0.2 % ophthalmic solution Place 1 drop into both eyes 2 (two) times daily.    . ENBREL 50 MG/ML injection 50 mg once a week.     . fenofibrate (TRICOR) 145 MG tablet TAKE 1 TABLET DAILY 90 tablet 3  . Fluticasone Furoate-Vilanterol (BREO ELLIPTA) 100-25 MCG/INH AEPB Inhale 1 puff into the lungs daily. 60 each 6  . furosemide (LASIX) 20 MG tablet Take by mouth.    . hydroxychloroquine (PLAQUENIL) 200 MG  tablet Take 200 mg by mouth daily.    Marland Kitchen ipratropium-albuterol (DUONEB) 0.5-2.5 (3) MG/3ML SOLN Inhale 3 mLs into the lungs every 4 (four) hours as needed. 90 mL 3  . latanoprost (XALATAN) 0.005 % ophthalmic solution Place 1 drop into both eyes at bedtime.    Marland Kitchen omeprazole (PRILOSEC) 40 MG capsule Take 1 capsule (40 mg total) by mouth daily. 90 capsule 0  . predniSONE (DELTASONE) 10 MG tablet Take 10 mg by mouth daily with breakfast.    . theophylline (UNIPHYL) 400 MG 24 hr tablet     . traMADol (ULTRAM) 50 MG tablet TAKE ONE TABLET BY MOUTH EVERY 12 HOURS AS NEEDED 60 tablet 0  . TRAVATAN Z 0.004 % SOLN ophthalmic solution     . traZODone (DESYREL) 50 MG tablet Take 0.5-1 tablets (25-50 mg total) by mouth at bedtime as needed for sleep. 30 tablet 3  . amoxicillin (AMOXIL) 500 MG capsule Take 1 capsule (500 mg total) by mouth 3 (three) times daily. 30 capsule 0  . amoxicillin-clavulanate (AUGMENTIN) 875-125 MG tablet Take 1 tablet by mouth 2 (two) times daily. (Patient not taking: Reported on 11/04/2016) 14 tablet 0  . azithromycin (ZITHROMAX) 250 MG tablet 2 tablets on Day 1 then 1 tablet daily on Days 2-5. (Patient not taking: Reported on 11/04/2016) 6 tablet 0  . predniSONE (DELTASONE) 20 MG tablet 3 pills once a day x7 days; and then 2 pill a day x7 days; and then one pill once a day x7 days; and then continue previous dose 45 tablet 0   No current facility-administered medications for this visit.       Marland Kitchen  PHYSICAL EXAMINATION: ECOG PERFORMANCE STATUS: 2 - Symptomatic, <50% confined to bed  Vitals:   11/04/16 1406  BP: 123/70  Pulse: 88  Resp: 16  Temp: (!) 97.2 F (36.2 C)   Filed Weights   11/04/16 1406  Weight: 177 lb (80.3 kg)    GENERAL: Well-nourished well-developed; Alert, no distress and comfortable.  He is Accompanied by family. He is wearing oxygen. He is in a wheel chair.  EYES: no pallor or icterus OROPHARYNX: no thrush or ulceration.  NECK: supple, no masses  felt LYMPH:  no palpable lymphadenopathy in the cervical, axillary or inguinal regions LUNGS: Bilateral decreased air entry; positive for bilateral wheeze/coarse breath sounds. HEART/CVS: regular rate & rhythm and no murmurs; No lower extremity edema ABDOMEN: abdomen soft, non-tender and normal bowel sounds Musculoskeletal:no cyanosis of digits and no clubbing  PSYCH: alert & oriented x 3 with fluent speech NEURO: no  focal motor/sensory deficits SKIN:  no rashes or significant lesions  LABORATORY DATA:  I have reviewed the data as listed Lab Results  Component Value Date   WBC 10.6 10/21/2016   HGB 7.9 (L) 10/21/2016   HCT 25.5 (L) 10/21/2016   MCV 78.1 (L) 10/21/2016   PLT 275 10/21/2016    Recent Labs  11/09/15 1058 02/09/16 1034 03/03/16 1354 10/07/16 1518  NA 137  --  141 141  K 3.6  --  4.3 4.1  CL 105  --  102 98  CO2 27  --  33* 36*  GLUCOSE 125*  --  107* 107*  BUN 22*  --  20 25*  CREATININE 1.01 1.10 1.05 1.03  CALCIUM 8.8*  --  9.5 9.2  GFRNONAA >60  --   --   --   GFRAA >60  --   --   --   PROT 7.3  --  7.2 6.4  ALBUMIN 3.7  --  3.7 3.5  AST 20  --  17 15  ALT 11*  --  10 10  ALKPHOS 38  --  41 39  BILITOT 0.4  --  0.3 0.3    RADIOGRAPHIC STUDIES: I have personally reviewed the radiological images as listed and agreed with the findings in the report. No results found.  ASSESSMENT & PLAN:   Primary cancer of left upper lobe of lung ([place]) # LEFT UPPER LOBE LUNG CA- STAGE I; [malignancy-based on imaging; poor candidate for biopsy]; S/p SBRT June 26th. Most recent CT scan- June 2018- slight increase in size of the left upper lobe lung nodule; however no new nodules. Plan is to apply a repeat CT scan in approximately 6 months.   # New-onset of IDA-July 2018 hemoglobin 7.9 ferritin 6 saturation 2%;  unclear etiology. Previous colonoscopy April 2013.  Stool occult x2; UA. Currently status post 2 IV iron infusions. Proceed with 2 more IV iron infusions.  Patient did think is a poor candidate for colonoscopy given his severe COPD- would recommend CT scan of the abdomen and pelvis at next visit.  # Worsening difficulty breathing-increasing cough/sputum production- consent for COPD exacerbation; recommend amoxicillin 500 3 times a day. Also recommend prednisone 60 mg a day with a taper over 3 weeks.   Also recommend follow-up with pulmonologist.   # follow up in 1 month/cbc-possible venofer.   Cc; Dr.Khan.    I reviewed the images myself and with the patient in detail.    Cammie Sickle, MD 11/06/2016 7:09 PM

## 2016-11-04 NOTE — Progress Notes (Signed)
Patient is here today for a follow up.  Patient has a productive cough, and states that he is coughing up "yellow, thick mucus." Patient also reports pain in back when coughing, and this makes it difficult to sleep. Patient states that he has been taking Mucinex with no relief.

## 2016-11-04 NOTE — Assessment & Plan Note (Addendum)
#   LEFT UPPER LOBE LUNG CA- STAGE I; [malignancy-based on imaging; poor candidate for biopsy]; S/p SBRT June 26th. Most recent CT scan- June 2018- slight increase in size of the left upper lobe lung nodule; however no new nodules. Plan is to apply a repeat CT scan in approximately 6 months.   # New-onset of IDA-July 2018 hemoglobin 7.9 ferritin 6 saturation 2%;  unclear etiology. Previous colonoscopy April 2013.  Stool occult x2; UA. Currently status post 2 IV iron infusions. Proceed with 2 more IV iron infusions. Patient did think is a poor candidate for colonoscopy given his severe COPD- would recommend CT scan of the abdomen and pelvis at next visit.  # Worsening difficulty breathing-increasing cough/sputum production- consent for COPD exacerbation; recommend amoxicillin 500 3 times a day. Also recommend prednisone 60 mg a day with a taper over 3 weeks.   Also recommend follow-up with pulmonologist.   # follow up in 1 month/cbc-possible venofer.   Cc; Dr.Khan.

## 2016-11-11 ENCOUNTER — Inpatient Hospital Stay: Payer: Medicare Other

## 2016-11-11 VITALS — BP 124/75 | HR 69 | Temp 97.2°F | Resp 18

## 2016-11-11 DIAGNOSIS — C3412 Malignant neoplasm of upper lobe, left bronchus or lung: Secondary | ICD-10-CM | POA: Diagnosis not present

## 2016-11-11 DIAGNOSIS — D500 Iron deficiency anemia secondary to blood loss (chronic): Secondary | ICD-10-CM

## 2016-11-11 MED ORDER — IRON SUCROSE 20 MG/ML IV SOLN
200.0000 mg | Freq: Once | INTRAVENOUS | Status: AC
  Administered 2016-11-11: 200 mg via INTRAVENOUS
  Filled 2016-11-11: qty 10

## 2016-11-11 MED ORDER — SODIUM CHLORIDE 0.9 % IV SOLN
Freq: Once | INTRAVENOUS | Status: AC
  Administered 2016-11-11: 14:00:00 via INTRAVENOUS
  Filled 2016-11-11: qty 1000

## 2016-11-11 MED ORDER — SODIUM CHLORIDE 0.9 % IV SOLN: 200.0000 mg | Freq: Once | INTRAVENOUS | Status: DC

## 2016-11-16 ENCOUNTER — Ambulatory Visit: Payer: Medicare Other

## 2016-11-30 ENCOUNTER — Other Ambulatory Visit: Payer: Self-pay

## 2016-11-30 MED ORDER — IPRATROPIUM-ALBUTEROL 0.5-2.5 (3) MG/3ML IN SOLN: 3.0000 mL | RESPIRATORY_TRACT | 3 refills | Status: AC | PRN

## 2016-11-30 NOTE — Telephone Encounter (Signed)
Last OV 10/07/16 , last filled by Dr.Walker 01/08/15 90 ml 3rf

## 2016-11-30 NOTE — Telephone Encounter (Signed)
Previously prescribed by Dr Gilford Rile Last filled 03/18/15 Last office visit 10/25/16 Next office visit 01/09/17

## 2016-12-02 ENCOUNTER — Ambulatory Visit: Payer: Medicare Other | Admitting: Internal Medicine

## 2016-12-02 ENCOUNTER — Other Ambulatory Visit: Payer: Medicare Other

## 2016-12-02 ENCOUNTER — Ambulatory Visit: Payer: Medicare Other

## 2016-12-05 ENCOUNTER — Inpatient Hospital Stay (HOSPITAL_BASED_OUTPATIENT_CLINIC_OR_DEPARTMENT_OTHER): Payer: Medicare Other | Admitting: Internal Medicine

## 2016-12-05 ENCOUNTER — Telehealth: Payer: Self-pay | Admitting: Internal Medicine

## 2016-12-05 ENCOUNTER — Inpatient Hospital Stay: Payer: Medicare Other | Attending: Internal Medicine

## 2016-12-05 ENCOUNTER — Other Ambulatory Visit: Payer: Self-pay | Admitting: *Deleted

## 2016-12-05 ENCOUNTER — Telehealth: Payer: Self-pay | Admitting: *Deleted

## 2016-12-05 ENCOUNTER — Inpatient Hospital Stay: Payer: Medicare Other

## 2016-12-05 VITALS — BP 108/60 | HR 78

## 2016-12-05 VITALS — BP 106/66 | HR 69 | Temp 97.8°F | Resp 24 | Ht 67.0 in | Wt 182.0 lb

## 2016-12-05 DIAGNOSIS — C3412 Malignant neoplasm of upper lobe, left bronchus or lung: Secondary | ICD-10-CM | POA: Diagnosis not present

## 2016-12-05 DIAGNOSIS — F17210 Nicotine dependence, cigarettes, uncomplicated: Secondary | ICD-10-CM

## 2016-12-05 DIAGNOSIS — Z79899 Other long term (current) drug therapy: Secondary | ICD-10-CM

## 2016-12-05 DIAGNOSIS — D649 Anemia, unspecified: Secondary | ICD-10-CM

## 2016-12-05 DIAGNOSIS — J449 Chronic obstructive pulmonary disease, unspecified: Secondary | ICD-10-CM | POA: Insufficient documentation

## 2016-12-05 DIAGNOSIS — I10 Essential (primary) hypertension: Secondary | ICD-10-CM | POA: Diagnosis not present

## 2016-12-05 DIAGNOSIS — M129 Arthropathy, unspecified: Secondary | ICD-10-CM

## 2016-12-05 DIAGNOSIS — Z923 Personal history of irradiation: Secondary | ICD-10-CM | POA: Diagnosis not present

## 2016-12-05 DIAGNOSIS — D500 Iron deficiency anemia secondary to blood loss (chronic): Secondary | ICD-10-CM

## 2016-12-05 DIAGNOSIS — D509 Iron deficiency anemia, unspecified: Secondary | ICD-10-CM | POA: Diagnosis not present

## 2016-12-05 LAB — COMPREHENSIVE METABOLIC PANEL
ALT: 17 U/L (ref 17–63)
AST: 21 U/L (ref 15–41)
Albumin: 3.6 g/dL (ref 3.5–5.0)
Alkaline Phosphatase: 36 U/L — ABNORMAL LOW (ref 38–126)
Anion gap: 7 (ref 5–15)
BUN: 23 mg/dL — ABNORMAL HIGH (ref 6–20)
CO2: 30 mmol/L (ref 22–32)
Calcium: 8.9 mg/dL (ref 8.9–10.3)
Chloride: 101 mmol/L (ref 101–111)
Creatinine, Ser: 1.27 mg/dL — ABNORMAL HIGH (ref 0.61–1.24)
GFR calc Af Amer: >60 mL/min (ref >60)
GFR calc non Af Amer: 53 mL/min — ABNORMAL LOW (ref >60)
Glucose, Bld: 127 mg/dL — ABNORMAL HIGH (ref 65–99)
Potassium: 4.5 mmol/L (ref 3.5–5.1)
Sodium: 138 mmol/L (ref 135–145)
Total Bilirubin: 0.5 mg/dL (ref 0.3–1.2)
Total Protein: 6.9 g/dL (ref 6.5–8.1)

## 2016-12-05 LAB — CBC WITH DIFFERENTIAL/PLATELET
Basophils Absolute: 0.1 10*3/uL (ref 0–0.1)
Basophils Relative: 1 %
Eosinophils Absolute: 0 10*3/uL (ref 0–0.7)
Eosinophils Relative: 1 %
HCT: 24.9 % — ABNORMAL LOW (ref 40.0–52.0)
Hemoglobin: 7.6 g/dL — ABNORMAL LOW (ref 13.0–18.0)
Lymphocytes Relative: 6 %
Lymphs Abs: 0.6 10*3/uL — ABNORMAL LOW (ref 1.0–3.6)
MCH: 23.7 pg — ABNORMAL LOW (ref 26.0–34.0)
MCHC: 30.5 g/dL — ABNORMAL LOW (ref 32.0–36.0)
MCV: 77.6 fL — ABNORMAL LOW (ref 80.0–100.0)
Monocytes Absolute: 0.3 10*3/uL (ref 0.2–1.0)
Monocytes Relative: 4 %
Neutro Abs: 8.7 10*3/uL — ABNORMAL HIGH (ref 1.4–6.5)
Neutrophils Relative %: 88 %
Platelets: 317 10*3/uL (ref 150–440)
RBC: 3.21 MIL/uL — ABNORMAL LOW (ref 4.40–5.90)
RDW: 21.5 % — ABNORMAL HIGH (ref 11.5–14.5)
WBC: 9.8 10*3/uL (ref 3.8–10.6)

## 2016-12-05 LAB — OCCULT BLOOD X 1 CARD TO LAB, STOOL
Fecal Occult Bld: POSITIVE — AB
Fecal Occult Bld: POSITIVE — AB

## 2016-12-05 MED ORDER — PANTOPRAZOLE SODIUM 40 MG PO TBEC: 40.0000 mg | DELAYED_RELEASE_TABLET | Freq: Two times a day (BID) | ORAL | 3 refills | Status: DC

## 2016-12-05 MED ORDER — IRON SUCROSE 20 MG/ML IV SOLN
200.0000 mg | Freq: Once | INTRAVENOUS | Status: AC
  Administered 2016-12-05: 200 mg via INTRAVENOUS
  Filled 2016-12-05: qty 10

## 2016-12-05 MED ORDER — SODIUM CHLORIDE 0.9 % IV SOLN
Freq: Once | INTRAVENOUS | Status: AC
  Administered 2016-12-05: 15:00:00 via INTRAVENOUS
  Filled 2016-12-05: qty 1000

## 2016-12-05 MED ORDER — ALBUTEROL SULFATE HFA 108 (90 BASE) MCG/ACT IN AERS: 2.0000 | INHALATION_SPRAY | Freq: Four times a day (QID) | RESPIRATORY_TRACT | 2 refills | Status: AC | PRN

## 2016-12-05 NOTE — Assessment & Plan Note (Addendum)
#   LEFT UPPER LOBE LUNG CA- STAGE I; [malignancy-based on imaging; poor candidate for biopsy]; S/p SBRT June 26th. Most recent CT scan- June 2018- slight increase in size of the left upper lobe lung nodule; however no new nodules.   # New-onset of IDA-July 2018 hemoglobin 7.3 ferritin 6 saturation 2%;  unclear etiology. Previous colonoscopy April 2013.  Currently status post 6 Venofer IV iron infusions. Not improved/ Stool occult x2-positive; UA.  I would recommend CT scan of the abdomen and pelvis ASAP. Discussed with Dr.Skulskie.   # COPD-severe/symptomatic. Currently stable  # Wheel chair/shower chair-is recommended given patient's difficulty with ADLs.   # weekly/cbc IV iron x4; CT ab/pelvis ASAP. Will call with results.   Cc; Dr.Khan; Dr.Skulskie.   Addendum: Discussed with Dr.Skulskie- last EGD colonoscopy 2010-gastritis/polyps. Evaluated in GI office in 2013-iron deficiency anemia; thought to be high risk for invasive procedures. Patient did not follow-up. GI recommends Protonix 40 twice a day [stop Prilosec]. We'll make referral to GI.

## 2016-12-05 NOTE — Progress Notes (Signed)
[facility] NOTE  Patient Care Team: Leone Haven, MD as PCP - General (Family Medicine) Allyne Gee, MD (Pulmonary Disease)  CHIEF COMPLAINTS/PURPOSE OF CONSULTATION:   Oncology History   # April 2016-LUL LUNG CA [clinical; no Bx sec to high risk] LUL LUNG NODULE [86mm]; MAY 2017- 40mm; PET avid- no distant mets s/p SBRT [end of June 2017];   # NOV 22nd CT scan- Improved LUL nodule  # COPD- home O2 [Dr.Saadat Khan]     Primary cancer of left upper lobe of lung ([place])   11/09/2015 Initial Diagnosis    Primary cancer of left upper lobe of lung ([place])        HISTORY OF PRESENTING ILLNESS:  Peter Johnston 76 y.o.  male long-standing history of smoking who unfortunately continues to smoke; also history of COPD on 2-3 L of O2 left upper lobe lung nodule growing in size status post SB RT end of June 2017 is here for follow-up.   At the last visit he was treated with Levaquin and prednisone for COPD exacerbation. His symptoms of shortness of breath have improved; back to baseline. Has chronic cough.  In the interim patient noted to have iron deficiency anemia- is currently status post 6 weekly treatments of IV iron. The etiology of iron deficiency is unclear.  However he notes to have black colored stools over the past few weeks. He denies abdominal pain.  Denies any new onset of constipation or diarrhea.   ROS: A complete 10 point review of system is done which is negative except mentioned above in history of present illness  MEDICAL HISTORY:  Past Medical History:  Diagnosis Date  . Arthritis   . Asthma   . COPD (chronic obstructive pulmonary disease) ([place])   . Hypertension   . IDA (iron deficiency anemia)   . Lung mass     SURGICAL HISTORY: Past Surgical History:  Procedure Laterality Date  . ABDOMINAL HERNIA REPAIR  1995   Dr. Rochel Brome  . [place]  2011, 2012   x 2- Dr. Elvina Mattes    SOCIAL HISTORY: retired with [employer];  [place]; with girl friend; and son.  Social History   Social History  . Marital status: Widowed    Spouse name: N/A  . Number of children: 2  . Years of education: N/A   Occupational History  . Retired     Social History Main Topics  . Smoking status: Former Smoker    Packs/day: 2.00    Years: 63.00    Types: Cigarettes    Quit date: 09/13/2015  . Smokeless tobacco: Never Used     Comment: Stopped smoking cigarettes Sunday and uses the vapor e-cig to help with cravings  . Alcohol use 0.0 oz/week     Comment: 2-3 beers at night  . Drug use: No  . Sexual activity: No   Other Topics Concern  . Not on file   Social History Narrative   Lives in [place] with son.      2 children, 2 grandchildren             FAMILY HISTORY: Family History  Problem Relation Age of Onset  . Stroke Father   . Heart disease Father   . Hypertension Father   . Heart disease Brother 59    ALLERGIES:  is allergic to tamsulosin; gold-containing drug products; hydrochlorothiazide; and sulfa antibiotics.  MEDICATIONS:  Current Outpatient Prescriptions  Medication Sig Dispense Refill  .  allopurinol (ZYLOPRIM) 300 MG tablet Take 300 mg by mouth daily.    Marland Kitchen atenolol (TENORMIN) 25 MG tablet Take 1 tablet (25 mg total) by mouth daily. 90 tablet 3  . brimonidine (ALPHAGAN) 0.2 % ophthalmic solution Place 1 drop into both eyes 2 (two) times daily.    . ENBREL 50 MG/ML injection 50 mg once a week.     . fenofibrate (TRICOR) 145 MG tablet TAKE 1 TABLET DAILY 90 tablet 3  . Fluticasone Furoate-Vilanterol (BREO ELLIPTA) 100-25 MCG/INH AEPB Inhale 1 puff into the lungs daily. 60 each 6  . hydroxychloroquine (PLAQUENIL) 200 MG tablet Take 200 mg by mouth daily.    Marland Kitchen ipratropium-albuterol (DUONEB) 0.5-2.5 (3) MG/3ML SOLN Inhale 3 mLs into the lungs every 4 (four) hours as needed. 90 mL 3  . latanoprost (XALATAN) 0.005 % ophthalmic solution Place 1 drop into both eyes at bedtime.    Marland Kitchen omeprazole  (PRILOSEC) 40 MG capsule Take 1 capsule (40 mg total) by mouth daily. (Patient taking differently: Take 40 mg by mouth as needed (gerd). ) 90 capsule 0  . predniSONE (DELTASONE) 10 MG tablet Take 10 mg by mouth daily with breakfast.    . traMADol (ULTRAM) 50 MG tablet TAKE ONE TABLET BY MOUTH EVERY 12 HOURS AS NEEDED 60 tablet 0  . TRAVATAN Z 0.004 % SOLN ophthalmic solution Place 1 drop into both eyes at bedtime.     Marland Kitchen albuterol (PROVENTIL HFA;VENTOLIN HFA) 108 (90 Base) MCG/ACT inhaler Inhale 2 puffs into the lungs every 6 (six) hours as needed for wheezing or shortness of breath. 1 Inhaler 2  . furosemide (LASIX) 20 MG tablet Take 1 tablet by mouth as needed for edema.    . theophylline (UNIPHYL) 400 MG 24 hr tablet Take 400 mg by mouth daily.     . traZODone (DESYREL) 50 MG tablet Take 0.5-1 tablets (25-50 mg total) by mouth at bedtime as needed for sleep. (Patient not taking: Reported on 12/05/2016) 30 tablet 3   No current facility-administered medications for this visit.       Marland Kitchen  PHYSICAL EXAMINATION: ECOG PERFORMANCE STATUS: 2 - Symptomatic, <50% confined to bed  Vitals:   12/05/16 1400  BP: 106/66  Pulse: 69  Resp: (!) 24  Temp: 97.8 F (36.6 C)   Filed Weights   12/05/16 1411  Weight: 182 lb (82.6 kg)    GENERAL: Well-nourished well-developed; Alert, no distress and comfortable.  He is Accompanied by family. He is wearing oxygen. He is in a wheel chair.  EYES: Positive for pallor. icterus OROPHARYNX: no thrush or ulceration.  NECK: supple, no masses felt LYMPH:  no palpable lymphadenopathy in the cervical, axillary or inguinal regions LUNGS: Bilateral decreased air entry; positive for bilateral wheeze/coarse breath sounds. HEART/CVS: regular rate & rhythm and no murmurs; No lower extremity edema ABDOMEN: abdomen soft, non-tender and normal bowel sounds Musculoskeletal:no cyanosis of digits and no clubbing  PSYCH: alert & oriented x 3 with fluent speech NEURO: no  focal motor/sensory deficits SKIN:  no rashes or significant lesions  LABORATORY DATA:  I have reviewed the data as listed Lab Results  Component Value Date   WBC 9.8 12/05/2016   HGB 7.6 (L) 12/05/2016   HCT 24.9 (L) 12/05/2016   MCV 77.6 (L) 12/05/2016   PLT 317 12/05/2016    Recent Labs  03/03/16 1354 10/07/16 1518 12/05/16 1335  NA 141 141 138  K 4.3 4.1 4.5  CL 102 98 101  CO2 33* 36*  30  GLUCOSE 107* 107* 127*  BUN 20 25* 23*  CREATININE 1.05 1.03 1.27*  CALCIUM 9.5 9.2 8.9  GFRNONAA  --   --  53*  GFRAA  --   --  >60  PROT 7.2 6.4 6.9  ALBUMIN 3.7 3.5 3.6  AST 17 15 21   ALT 10 10 17   ALKPHOS 41 39 36*  BILITOT 0.3 0.3 0.5    RADIOGRAPHIC STUDIES: I have personally reviewed the radiological images as listed and agreed with the findings in the report. No results found.  ASSESSMENT & PLAN:   Primary cancer of left upper lobe of lung ([place]) # LEFT UPPER LOBE LUNG CA- STAGE I; [malignancy-based on imaging; poor candidate for biopsy]; S/p SBRT June 26th. Most recent CT scan- June 2018- slight increase in size of the left upper lobe lung nodule; however no new nodules.   # New-onset of IDA-July 2018 hemoglobin 7.3 ferritin 6 saturation 2%;  unclear etiology. Previous colonoscopy April 2013.  Currently status post 6 Venofer IV iron infusions. Not improved/ Stool occult x2-positive; UA.  I would recommend CT scan of the abdomen and pelvis ASAP. Discussed with Dr.Skulskie.   # COPD-severe/symptomatic. Currently stable  # Wheel chair/shower chair-is recommended given patient's difficulty with ADLs.   # weekly/cbc IV iron x4; CT ab/pelvis ASAP. Will call with results.   Cc; Dr.Khan; Dr.Skulskie.      Cammie Sickle, MD 12/05/2016 4:01 PM

## 2016-12-05 NOTE — Telephone Encounter (Signed)
Discussed with Dr.Skulskie. GI recommends Protonix 40 twice a day [stop Prilosec]. We'll make referral to GI [can see pt tomorrow].  # Also set him up for 1 unit of PRBC transfusion- in 1-2 days.   Please inform patient of above; and I have called in the prescription for Protonix to his pharmacy. And also inform patient that if he gets more dizzy/short of breath- would recommend going to the emergency room.

## 2016-12-05 NOTE — Telephone Encounter (Signed)
Patient notified that Dr. Gustavo Lah could see him at 2pm tomorrow. Pt refused to take this apt time due to no transportation. Pt to call [facility] back to obtain a new apt time.

## 2016-12-05 NOTE — Progress Notes (Signed)
Patient is requesting albuterol rescue inhaler and also a w/c-transport chair and shower chair. Pt states that he is becomes very dyspneic when taking showers due to copd.  Patient also c/o lower ext edema. Patient has lasix 20 mg (old tablet-potentially expired at home). He has not taking any lasix for edema.

## 2016-12-06 ENCOUNTER — Ambulatory Visit
Admission: RE | Admit: 2016-12-06 | Discharge: 2016-12-06 | Disposition: A | Discharge status: Home / Self Care | Payer: Medicare Other | Source: Ambulatory Visit | Attending: Internal Medicine | Admitting: Internal Medicine

## 2016-12-06 ENCOUNTER — Other Ambulatory Visit: Payer: Self-pay

## 2016-12-06 ENCOUNTER — Inpatient Hospital Stay: Payer: Medicare Other

## 2016-12-06 DIAGNOSIS — D500 Iron deficiency anemia secondary to blood loss (chronic): Secondary | ICD-10-CM

## 2016-12-06 DIAGNOSIS — K8020 Calculus of gallbladder without cholecystitis without obstruction: Secondary | ICD-10-CM | POA: Diagnosis not present

## 2016-12-06 DIAGNOSIS — J449 Chronic obstructive pulmonary disease, unspecified: Secondary | ICD-10-CM | POA: Diagnosis not present

## 2016-12-06 DIAGNOSIS — C3412 Malignant neoplasm of upper lobe, left bronchus or lung: Secondary | ICD-10-CM | POA: Diagnosis not present

## 2016-12-06 DIAGNOSIS — D509 Iron deficiency anemia, unspecified: Secondary | ICD-10-CM

## 2016-12-06 DIAGNOSIS — K632 Fistula of intestine: Secondary | ICD-10-CM | POA: Diagnosis not present

## 2016-12-06 DIAGNOSIS — I700 Atherosclerosis of aorta: Secondary | ICD-10-CM | POA: Diagnosis not present

## 2016-12-06 DIAGNOSIS — I517 Cardiomegaly: Secondary | ICD-10-CM | POA: Diagnosis not present

## 2016-12-06 DIAGNOSIS — M4856XA Collapsed vertebra, not elsewhere classified, lumbar region, initial encounter for fracture: Secondary | ICD-10-CM | POA: Insufficient documentation

## 2016-12-06 DIAGNOSIS — K5730 Diverticulosis of large intestine without perforation or abscess without bleeding: Secondary | ICD-10-CM | POA: Insufficient documentation

## 2016-12-06 DIAGNOSIS — D649 Anemia, unspecified: Secondary | ICD-10-CM

## 2016-12-06 LAB — ABO/RH: ABO/RH(D): O POS

## 2016-12-06 LAB — PREPARE RBC (CROSSMATCH)

## 2016-12-06 MED ORDER — IOPAMIDOL (ISOVUE-300) INJECTION 61%: 100.0000 mL | Freq: Once | INTRAVENOUS | Status: DC | PRN

## 2016-12-06 MED ORDER — IOPAMIDOL (ISOVUE-300) INJECTION 61%
100.0000 mL | Freq: Once | INTRAVENOUS | Status: AC | PRN
  Administered 2016-12-06: 100 mL via INTRAVENOUS

## 2016-12-06 NOTE — Telephone Encounter (Signed)
Spoke with patient. Pt made aware of plan of care. Will come 9/11 am s/p ct scan for type/cross for 1 unit prbc. Orders entered per md order.

## 2016-12-07 ENCOUNTER — Telehealth: Payer: Self-pay | Admitting: *Deleted

## 2016-12-07 ENCOUNTER — Inpatient Hospital Stay: Payer: Medicare Other

## 2016-12-07 DIAGNOSIS — C3412 Malignant neoplasm of upper lobe, left bronchus or lung: Secondary | ICD-10-CM | POA: Diagnosis not present

## 2016-12-07 DIAGNOSIS — D649 Anemia, unspecified: Secondary | ICD-10-CM

## 2016-12-07 MED ORDER — ACETAMINOPHEN 325 MG PO TABS
650.0000 mg | ORAL_TABLET | Freq: Once | ORAL | Status: AC
  Administered 2016-12-07: 650 mg via ORAL
  Filled 2016-12-07: qty 2

## 2016-12-07 MED ORDER — DIPHENHYDRAMINE HCL 25 MG PO CAPS
25.0000 mg | ORAL_CAPSULE | Freq: Once | ORAL | Status: AC
  Administered 2016-12-07: 25 mg via ORAL
  Filled 2016-12-07: qty 1

## 2016-12-07 MED ORDER — SODIUM CHLORIDE 0.9% FLUSH
3.0000 mL | INTRAVENOUS | Status: DC | PRN
Start: 2016-12-07 — End: 2016-12-07
  Filled 2016-12-07: qty 3

## 2016-12-07 MED ORDER — SODIUM CHLORIDE 0.9 % IV SOLN
250.0000 mL | Freq: Once | INTRAVENOUS | Status: AC
Start: 2016-12-07 — End: 2016-12-07
  Administered 2016-12-07: 250 mL via INTRAVENOUS
  Filled 2016-12-07: qty 250

## 2016-12-07 NOTE — Telephone Encounter (Signed)
Spoke with patient- pt did not show for his blood transfusion apt. Pt states that he had his blood bank band on but didn't know when to come. He can be at the clinic today by 1pm. I spoke with Maudie Mercury, infusion charge nurse-who agreed to have pt accommodate pt at 130 pm for blood transfusion. Pt agreed to be here at 130pm.

## 2016-12-08 LAB — BPAM RBC
BLOOD PRODUCT EXPIRATION DATE: 201809222359
Blood Product Expiration Date: 201810052359
ISSUE DATE / TIME: 201809121407
ISSUE DATE / TIME: 201809121125
UNIT TYPE AND RH: 5100
UNIT TYPE AND RH: 5100

## 2016-12-08 LAB — TYPE AND SCREEN
ABO/RH(D): O POS
ANTIBODY SCREEN: NEGATIVE
Unit division: 0
Unit division: 0

## 2016-12-12 ENCOUNTER — Inpatient Hospital Stay: Payer: Medicare Other

## 2016-12-12 DIAGNOSIS — C3412 Malignant neoplasm of upper lobe, left bronchus or lung: Secondary | ICD-10-CM | POA: Diagnosis not present

## 2016-12-12 DIAGNOSIS — D500 Iron deficiency anemia secondary to blood loss (chronic): Secondary | ICD-10-CM

## 2016-12-12 DIAGNOSIS — J449 Chronic obstructive pulmonary disease, unspecified: Secondary | ICD-10-CM

## 2016-12-12 LAB — CBC WITH DIFFERENTIAL/PLATELET
Basophils Absolute: 0.1 10*3/uL (ref 0–0.1)
Basophils Relative: 1 %
EOS PCT: 0 %
Eosinophils Absolute: 0 10*3/uL (ref 0–0.7)
HCT: 29.9 % — ABNORMAL LOW (ref 40.0–52.0)
Hemoglobin: 9.2 g/dL — ABNORMAL LOW (ref 13.0–18.0)
LYMPHS ABS: 0.8 10*3/uL — AB (ref 1.0–3.6)
LYMPHS PCT: 7 %
MCH: 23.9 pg — AB (ref 26.0–34.0)
MCHC: 30.7 g/dL — ABNORMAL LOW (ref 32.0–36.0)
MCV: 78.1 fL — AB (ref 80.0–100.0)
MONO ABS: 0.4 10*3/uL (ref 0.2–1.0)
Monocytes Relative: 4 %
Neutro Abs: 10.5 10*3/uL — ABNORMAL HIGH (ref 1.4–6.5)
Neutrophils Relative %: 88 %
PLATELETS: 369 10*3/uL (ref 150–440)
RBC: 3.83 MIL/uL — AB (ref 4.40–5.90)
RDW: 22.1 % — ABNORMAL HIGH (ref 11.5–14.5)
WBC: 11.8 10*3/uL — ABNORMAL HIGH (ref 3.8–10.6)

## 2016-12-12 MED ORDER — SODIUM CHLORIDE 0.9 % IV SOLN
Freq: Once | INTRAVENOUS | Status: AC
  Administered 2016-12-12: 15:00:00 via INTRAVENOUS
  Filled 2016-12-12: qty 1000

## 2016-12-12 MED ORDER — IRON SUCROSE 20 MG/ML IV SOLN
200.0000 mg | Freq: Once | INTRAVENOUS | Status: AC
  Administered 2016-12-12: 200 mg via INTRAVENOUS
  Filled 2016-12-12: qty 10

## 2016-12-19 ENCOUNTER — Inpatient Hospital Stay: Payer: Medicare Other

## 2016-12-19 VITALS — BP 128/84 | HR 70 | Temp 96.8°F | Resp 24

## 2016-12-19 DIAGNOSIS — C3412 Malignant neoplasm of upper lobe, left bronchus or lung: Secondary | ICD-10-CM | POA: Diagnosis not present

## 2016-12-19 DIAGNOSIS — J449 Chronic obstructive pulmonary disease, unspecified: Secondary | ICD-10-CM

## 2016-12-19 DIAGNOSIS — D500 Iron deficiency anemia secondary to blood loss (chronic): Secondary | ICD-10-CM

## 2016-12-19 LAB — CBC WITH DIFFERENTIAL/PLATELET
BASOS ABS: 0.1 10*3/uL (ref 0–0.1)
BASOS PCT: 1 %
EOS ABS: 0.1 10*3/uL (ref 0–0.7)
Eosinophils Relative: 1 %
HCT: 32.1 % — ABNORMAL LOW (ref 40.0–52.0)
HEMOGLOBIN: 10 g/dL — AB (ref 13.0–18.0)
LYMPHS ABS: 0.8 10*3/uL — AB (ref 1.0–3.6)
Lymphocytes Relative: 7 %
MCH: 24.5 pg — ABNORMAL LOW (ref 26.0–34.0)
MCHC: 31.1 g/dL — ABNORMAL LOW (ref 32.0–36.0)
MCV: 78.7 fL — ABNORMAL LOW (ref 80.0–100.0)
Monocytes Absolute: 0.5 10*3/uL (ref 0.2–1.0)
Monocytes Relative: 4 %
NEUTROS PCT: 87 %
Neutro Abs: 9.6 10*3/uL — ABNORMAL HIGH (ref 1.4–6.5)
Platelets: 339 10*3/uL (ref 150–440)
RBC: 4.08 MIL/uL — AB (ref 4.40–5.90)
RDW: 22.3 % — ABNORMAL HIGH (ref 11.5–14.5)
WBC: 11 10*3/uL — AB (ref 3.8–10.6)

## 2016-12-19 MED ORDER — IRON SUCROSE 20 MG/ML IV SOLN
200.0000 mg | Freq: Once | INTRAVENOUS | Status: AC
  Administered 2016-12-19: 200 mg via INTRAVENOUS
  Filled 2016-12-19: qty 10

## 2016-12-19 MED ORDER — SODIUM CHLORIDE 0.9 % IV SOLN
Freq: Once | INTRAVENOUS | Status: AC
  Administered 2016-12-19: 15:00:00 via INTRAVENOUS
  Filled 2016-12-19: qty 1000

## 2016-12-26 ENCOUNTER — Inpatient Hospital Stay: Payer: Medicare Other | Attending: Internal Medicine

## 2016-12-26 ENCOUNTER — Inpatient Hospital Stay: Payer: Medicare Other

## 2016-12-26 VITALS — BP 122/75 | HR 71 | Temp 97.7°F | Resp 20

## 2016-12-26 DIAGNOSIS — K5900 Constipation, unspecified: Secondary | ICD-10-CM | POA: Insufficient documentation

## 2016-12-26 DIAGNOSIS — K921 Melena: Secondary | ICD-10-CM | POA: Diagnosis not present

## 2016-12-26 DIAGNOSIS — Z87891 Personal history of nicotine dependence: Secondary | ICD-10-CM | POA: Diagnosis not present

## 2016-12-26 DIAGNOSIS — D509 Iron deficiency anemia, unspecified: Secondary | ICD-10-CM | POA: Diagnosis not present

## 2016-12-26 DIAGNOSIS — I10 Essential (primary) hypertension: Secondary | ICD-10-CM | POA: Diagnosis not present

## 2016-12-26 DIAGNOSIS — Z79899 Other long term (current) drug therapy: Secondary | ICD-10-CM | POA: Diagnosis not present

## 2016-12-26 DIAGNOSIS — C3412 Malignant neoplasm of upper lobe, left bronchus or lung: Secondary | ICD-10-CM | POA: Diagnosis not present

## 2016-12-26 DIAGNOSIS — D500 Iron deficiency anemia secondary to blood loss (chronic): Secondary | ICD-10-CM

## 2016-12-26 DIAGNOSIS — K8020 Calculus of gallbladder without cholecystitis without obstruction: Secondary | ICD-10-CM | POA: Diagnosis not present

## 2016-12-26 DIAGNOSIS — I700 Atherosclerosis of aorta: Secondary | ICD-10-CM | POA: Insufficient documentation

## 2016-12-26 DIAGNOSIS — J449 Chronic obstructive pulmonary disease, unspecified: Secondary | ICD-10-CM | POA: Diagnosis not present

## 2016-12-26 LAB — CBC WITH DIFFERENTIAL/PLATELET
BASOS PCT: 1 %
Basophils Absolute: 0.1 10*3/uL (ref 0–0.1)
EOS ABS: 0.1 10*3/uL (ref 0–0.7)
Eosinophils Relative: 1 %
HEMATOCRIT: 33 % — AB (ref 40.0–52.0)
HEMOGLOBIN: 10.3 g/dL — AB (ref 13.0–18.0)
LYMPHS ABS: 0.9 10*3/uL — AB (ref 1.0–3.6)
Lymphocytes Relative: 9 %
MCH: 24.6 pg — AB (ref 26.0–34.0)
MCHC: 31.2 g/dL — AB (ref 32.0–36.0)
MCV: 78.9 fL — ABNORMAL LOW (ref 80.0–100.0)
Monocytes Absolute: 0.4 10*3/uL (ref 0.2–1.0)
Monocytes Relative: 4 %
NEUTROS ABS: 8.4 10*3/uL — AB (ref 1.4–6.5)
NEUTROS PCT: 85 %
Platelets: 222 10*3/uL (ref 150–440)
RBC: 4.19 MIL/uL — AB (ref 4.40–5.90)
RDW: 22.2 % — ABNORMAL HIGH (ref 11.5–14.5)
WBC: 9.9 10*3/uL (ref 3.8–10.6)

## 2016-12-26 MED ORDER — SODIUM CHLORIDE 0.9 % IV SOLN
Freq: Once | INTRAVENOUS | Status: AC
  Administered 2016-12-26: 15:00:00 via INTRAVENOUS
  Filled 2016-12-26: qty 1000

## 2016-12-26 MED ORDER — IRON SUCROSE 20 MG/ML IV SOLN
200.0000 mg | Freq: Once | INTRAVENOUS | Status: AC
  Administered 2016-12-26: 200 mg via INTRAVENOUS
  Filled 2016-12-26: qty 10

## 2017-01-02 ENCOUNTER — Inpatient Hospital Stay: Payer: Medicare Other

## 2017-01-02 ENCOUNTER — Inpatient Hospital Stay (HOSPITAL_BASED_OUTPATIENT_CLINIC_OR_DEPARTMENT_OTHER): Payer: Medicare Other | Admitting: Internal Medicine

## 2017-01-02 VITALS — BP 140/86 | HR 80

## 2017-01-02 VITALS — BP 125/79 | HR 82 | Temp 97.8°F | Resp 24 | Ht 67.0 in | Wt 169.8 lb

## 2017-01-02 DIAGNOSIS — I700 Atherosclerosis of aorta: Secondary | ICD-10-CM | POA: Diagnosis not present

## 2017-01-02 DIAGNOSIS — Z79899 Other long term (current) drug therapy: Secondary | ICD-10-CM

## 2017-01-02 DIAGNOSIS — K8020 Calculus of gallbladder without cholecystitis without obstruction: Secondary | ICD-10-CM

## 2017-01-02 DIAGNOSIS — D500 Iron deficiency anemia secondary to blood loss (chronic): Secondary | ICD-10-CM

## 2017-01-02 DIAGNOSIS — D509 Iron deficiency anemia, unspecified: Secondary | ICD-10-CM

## 2017-01-02 DIAGNOSIS — C3412 Malignant neoplasm of upper lobe, left bronchus or lung: Secondary | ICD-10-CM | POA: Diagnosis not present

## 2017-01-02 DIAGNOSIS — Z87891 Personal history of nicotine dependence: Secondary | ICD-10-CM

## 2017-01-02 DIAGNOSIS — J449 Chronic obstructive pulmonary disease, unspecified: Secondary | ICD-10-CM | POA: Diagnosis not present

## 2017-01-02 DIAGNOSIS — K921 Melena: Secondary | ICD-10-CM | POA: Diagnosis not present

## 2017-01-02 DIAGNOSIS — K5900 Constipation, unspecified: Secondary | ICD-10-CM | POA: Diagnosis not present

## 2017-01-02 DIAGNOSIS — I10 Essential (primary) hypertension: Secondary | ICD-10-CM | POA: Diagnosis not present

## 2017-01-02 DIAGNOSIS — R0602 Shortness of breath: Secondary | ICD-10-CM

## 2017-01-02 LAB — CBC WITH DIFFERENTIAL/PLATELET
BASOS ABS: 0.1 10*3/uL (ref 0–0.1)
Basophils Relative: 1 %
Eosinophils Absolute: 0.1 10*3/uL (ref 0–0.7)
Eosinophils Relative: 1 %
HEMATOCRIT: 37.1 % — AB (ref 40.0–52.0)
Hemoglobin: 11.3 g/dL — ABNORMAL LOW (ref 13.0–18.0)
LYMPHS ABS: 1.2 10*3/uL (ref 1.0–3.6)
LYMPHS PCT: 11 %
MCH: 24.5 pg — ABNORMAL LOW (ref 26.0–34.0)
MCHC: 30.4 g/dL — ABNORMAL LOW (ref 32.0–36.0)
MCV: 80.7 fL (ref 80.0–100.0)
MONO ABS: 0.6 10*3/uL (ref 0.2–1.0)
MONOS PCT: 5 %
NEUTROS ABS: 9.4 10*3/uL — AB (ref 1.4–6.5)
Neutrophils Relative %: 82 %
Platelets: 216 10*3/uL (ref 150–440)
RBC: 4.6 MIL/uL (ref 4.40–5.90)
RDW: 23.1 % — AB (ref 11.5–14.5)
WBC: 11.4 10*3/uL — ABNORMAL HIGH (ref 3.8–10.6)

## 2017-01-02 MED ORDER — SODIUM CHLORIDE 0.9 % IV SOLN
Freq: Once | INTRAVENOUS | Status: AC
  Administered 2017-01-02: 16:00:00 via INTRAVENOUS
  Filled 2017-01-02: qty 1000

## 2017-01-02 MED ORDER — IRON SUCROSE 20 MG/ML IV SOLN
200.0000 mg | Freq: Once | INTRAVENOUS | Status: AC
  Administered 2017-01-02: 200 mg via INTRAVENOUS
  Filled 2017-01-02: qty 10

## 2017-01-02 NOTE — Assessment & Plan Note (Addendum)
#   LEFT UPPER LOBE LUNG CA- STAGE I; [malignancy-based on imaging; poor candidate for biopsy]; S/p SBRT June 26th. Most recent CT scan- June 2018- slight increase in size of the left upper lobe lung nodule; however no new nodules.   # New-onset of IDA-July 2018 hemoglobin 7.3 ferritin 6 saturation 2%;  Unclear etiology. Currently status post 8 Venofer IV iron infusions. Hemoglobin 11.3 today. Some improvement in fatigue, pallor, and sob.   #[place]- Dr. Rogue Bussing discussed patient with Dr. Gustavo Lah. Last EGD colonoscopy 2010-gastritis/polyps. Evaluated in GI office in 2013-iron deficiency anemia; thought to be high risk for invasive procedures. GI recommended avoidance of etoh & PPI. Pt to f/u w/ GI 01/17/17.  # COPD-severe. Symptomatic but stable. 3L oxygen. Mobility limited d/t sob. Pt reports mild sob improvement with IV iron.   # Wheel chair/shower chair-is recommended given patient's difficulty with ADLs.   # Venofer today, venofer in 1 week, venofer in 2 weeks then rtc in 6 weeks for labs and consideration of IV venofer

## 2017-01-02 NOTE — Progress Notes (Signed)
Patient's c/o dyspnea with exertion. Patient on 4L oxygen-sats after ambulating to scale 91%.

## 2017-01-02 NOTE — Progress Notes (Signed)
[facility] NOTE  Patient Care Team: Leone Haven, MD as PCP - General (Family Medicine) Allyne Gee, MD (Pulmonary Disease) Lollie Sails, MD as Consulting Physician (Gastroenterology)  CHIEF COMPLAINTS/PURPOSE OF CONSULTATION:   Oncology History   # April 2016-LUL LUNG CA [clinical; no Bx sec to high risk] LUL LUNG NODULE [85mm]; MAY 2017- 26mm; PET avid- no distant mets s/p SBRT [end of June 2017];   # NOV 22nd CT scan- Improved LUL nodule  # COPD- home O2 [Dr.Saadat Khan]     Primary cancer of left upper lobe of lung ([place])   11/09/2015 Initial Diagnosis    Primary cancer of left upper lobe of lung ([place])        HISTORY OF PRESENTING ILLNESS:  Peter Johnston 76 y.o.  male with history of left upper lobe lung nodule growing in size, s/p SB RT ended June 2017, advanced COPD on 3L O2, who continues to smoke, arrives today for f/u for iron deficiency anemia d/t chronic blood loss. He is s/p 8 weekly treatments of IV Venofer.   He continues to have symptoms d/t his COPD. He was treated for exacerbation recently with levaquin and prednisone.  He saw Dr. Gustavo Lah on 12/06/16 for melena. He is a high risk candidate for surgery/sedation. He is avoiding alcohol and has started PPIs. He had one unit of pRBCs on 12/06/16. He sees Dr. Gustavo Lah again on 01/17/17.   He denies abdominal pain.  Denies any new onset of constipation or diarrhea.He is accompanied today by his sister.   ROS: A complete 10 point review of system is done which is negative except mentioned above in history of present illness  MEDICAL HISTORY:  Past Medical History:  Diagnosis Date  . Arthritis   . Asthma   . COPD (chronic obstructive pulmonary disease) ([place])   . Hypertension   . IDA (iron deficiency anemia)   . Lung mass     SURGICAL HISTORY: Past Surgical History:  Procedure Laterality Date  . ABDOMINAL HERNIA REPAIR  1995   Dr. Rochel Brome  . [place]   2011, 2012   x 2- Dr. Elvina Mattes    SOCIAL HISTORY: retired with [employer]; [place]; with girl friend; and son.  Social History   Social History  . Marital status: Widowed    Spouse name: N/A  . Number of children: 2  . Years of education: N/A   Occupational History  . Retired     Social History Main Topics  . Smoking status: Former Smoker    Packs/day: 2.00    Years: 63.00    Types: Cigarettes    Quit date: 09/13/2015  . Smokeless tobacco: Never Used     Comment: Stopped smoking cigarettes Sunday and uses the vapor e-cig to help with cravings  . Alcohol use 0.0 oz/week     Comment: 2-3 beers at night  . Drug use: No  . Sexual activity: No   Other Topics Concern  . Not on file   Social History Narrative   Lives in [place] with son.      2 children, 2 grandchildren             FAMILY HISTORY: Family History  Problem Relation Age of Onset  . Stroke Father   . Heart disease Father   . Hypertension Father   . Heart disease Brother 69    ALLERGIES:  is allergic to tamsulosin; gold-containing drug products; hydrochlorothiazide; and  sulfa antibiotics.  MEDICATIONS:  Current Outpatient Prescriptions  Medication Sig Dispense Refill  . albuterol (PROVENTIL HFA;VENTOLIN HFA) 108 (90 Base) MCG/ACT inhaler Inhale 2 puffs into the lungs every 6 (six) hours as needed for wheezing or shortness of breath. 1 Inhaler 2  . allopurinol (ZYLOPRIM) 300 MG tablet Take 300 mg by mouth daily.    Marland Kitchen atenolol (TENORMIN) 25 MG tablet Take 1 tablet (25 mg total) by mouth daily. 90 tablet 3  . brimonidine (ALPHAGAN) 0.2 % ophthalmic solution Place 1 drop into both eyes 2 (two) times daily.    . ENBREL 50 MG/ML injection 50 mg once a week.     . fenofibrate (TRICOR) 145 MG tablet TAKE 1 TABLET DAILY 90 tablet 3  . Fluticasone Furoate-Vilanterol (BREO ELLIPTA) 100-25 MCG/INH AEPB Inhale 1 puff into the lungs daily. 60 each 6  . furosemide (LASIX) 20 MG tablet Take 1 tablet by mouth as  needed for edema.    . hydroxychloroquine (PLAQUENIL) 200 MG tablet Take 200 mg by mouth daily.    Marland Kitchen ipratropium-albuterol (DUONEB) 0.5-2.5 (3) MG/3ML SOLN Inhale 3 mLs into the lungs every 4 (four) hours as needed. 90 mL 3  . latanoprost (XALATAN) 0.005 % ophthalmic solution Place 1 drop into both eyes at bedtime.    Marland Kitchen omeprazole (PRILOSEC) 40 MG capsule Take 1 capsule (40 mg total) by mouth daily. (Patient taking differently: Take 40 mg by mouth as needed (gerd). ) 90 capsule 0  . pantoprazole (PROTONIX) 40 MG tablet Take 1 tablet (40 mg total) by mouth 2 (two) times daily before a meal. 60 tablet 3  . predniSONE (DELTASONE) 10 MG tablet Take 10 mg by mouth daily with breakfast.    . theophylline (UNIPHYL) 400 MG 24 hr tablet Take 400 mg by mouth daily.     . traMADol (ULTRAM) 50 MG tablet TAKE ONE TABLET BY MOUTH EVERY 12 HOURS AS NEEDED 60 tablet 0  . TRAVATAN Z 0.004 % SOLN ophthalmic solution Place 1 drop into both eyes at bedtime.     . traZODone (DESYREL) 50 MG tablet Take 0.5-1 tablets (25-50 mg total) by mouth at bedtime as needed for sleep. (Patient not taking: Reported on 12/05/2016) 30 tablet 3   No current facility-administered medications for this visit.    Facility-Administered Medications Ordered in Other Visits  Medication Dose Route Frequency Provider Last Rate Last Dose  . iron sucrose (VENOFER) 200 mg IVPB  200 mg Intravenous Once Charlaine Dalton R, MD 120 mL/hr at 01/02/17 1553 200 mg at 01/02/17 1553    PHYSICAL EXAMINATION: ECOG PERFORMANCE STATUS: 2 - Symptomatic, <50% confined to bed  Vitals:   01/02/17 1441  BP: 125/79  Pulse: 82  Resp: (!) 24  Temp: 97.8 F (36.6 C)  SpO2: 91%   Filed Weights   01/02/17 1441  Weight: 169 lb 12.8 oz (77 kg)    GENERAL: Well-nourished well-developed; Alert, on 3L oxygen via nasal cannula. He is accompanied by his sister. He is sitting in wheel chair.   EYES: Pallor has improved compared to previous visits OROPHARYNX:  no thrush or ulceration.  NECK: supple, no masses felt LYMPH:  no palpable lymphadenopathy in the cervical nodes LUNGS: Bilateral decreased air entry; positive for bilateral wheeze/coarse breath sounds. HEART/CVS: regular rate & rhythm and no murmurs; No lower extremity edema ABDOMEN: abdomen soft, non-tender and normal bowel sounds. Obese. Musculoskeletal:no cyanosis of digits and no clubbing  PSYCH: alert & oriented x 3 with fluent speech NEURO: no  focal motor/sensory deficits SKIN:  no rashes or significant lesions  LABORATORY DATA:  I have reviewed the data as listed Lab Results  Component Value Date   WBC 11.4 (H) 01/02/2017   HGB 11.3 (L) 01/02/2017   HCT 37.1 (L) 01/02/2017   MCV 80.7 01/02/2017   PLT 216 01/02/2017    Recent Labs  03/03/16 1354 10/07/16 1518 12/05/16 1335  NA 141 141 138  K 4.3 4.1 4.5  CL 102 98 101  CO2 33* 36* 30  GLUCOSE 107* 107* 127*  BUN 20 25* 23*  CREATININE 1.05 1.03 1.27*  CALCIUM 9.5 9.2 8.9  GFRNONAA  --   --  53*  GFRAA  --   --  >60  PROT 7.2 6.4 6.9  ALBUMIN 3.7 3.5 3.6  AST 17 15 21   ALT 10 10 17   ALKPHOS 41 39 36*  BILITOT 0.3 0.3 0.5    RADIOGRAPHIC STUDIES: I have personally reviewed the radiological images as listed and agreed with the findings in the report. Ct Abdomen Pelvis W Contrast  Result Date: 12/06/2016 CLINICAL DATA:  Severe anemia. Constipation. History of left upper lobe lung cancer diagnosed June 2017 status post radiation therapy. COPD. EXAM: CT ABDOMEN AND PELVIS WITH CONTRAST TECHNIQUE: Multidetector CT imaging of the abdomen and pelvis was performed using the standard protocol following bolus administration of intravenous contrast. CONTRAST:  164mL ISOVUE-300 IOPAMIDOL (ISOVUE-300) INJECTION 61% COMPARISON:  08/27/2015 PET-CT. FINDINGS: Lower chest: Centrilobular emphysema and hypoventilatory changes at the lung bases. Mild cardiomegaly. Hepatobiliary: Normal liver size. No liver mass. Cholelithiasis, no  gallbladder wall thickening, gallbladder distention or pericholecystic fluid. No biliary ductal dilatation. Pancreas: Stable coarse calcifications in the inferior pancreatic head, which may be vascular or due to chronic pancreatitis. No discrete pancreatic mass or pancreatic duct dilation. Spleen: Normal size. No mass. Adrenals/Urinary Tract: Normal adrenals. Normal kidneys with no hydronephrosis and no renal mass. Normal bladder. Stomach/Bowel: Grossly normal stomach. Normal caliber small bowel with no small bowel wall thickening. Normal appendix. Moderate diffuse colonic diverticulosis, most prominent in the sigmoid colon. There is segmental wall thickening in the mid to distal sigmoid colon, similar to the 08/27/2015 PET-CT study. There is a complex interloop fistula in the mid to distal sigmoid colon (series 2/images 50-52), which appears to communicate with the sigmoid colon in at least 3 locations. There is gas within the pericolonic fistula tract without appreciable drainable abscess. No additional sites of large bowel wall thickening. Oral contrast progresses to the distal transverse colon. Vascular/Lymphatic: Atherosclerotic nonaneurysmal abdominal aorta. Patent portal, splenic, hepatic and renal veins. No pathologically enlarged lymph nodes in the abdomen or pelvis. Reproductive: Top-normal size prostate with nonspecific coarse internal prostatic calcifications. Other: No ascites. No focal fluid collection. No additional sites of pneumoperitoneum. Musculoskeletal: No aggressive appearing focal osseous lesions. Moderate L2 vertebral compression fracture with approximately 50% loss of vertebral body height, worsened since 07/11/2013 lumbar spine MRI. Moderate L4 vertebral compression fracture with approximately 40% loss of vertebral body height, new since 07/11/2013. Moderate L5 vertebral compression fracture is unchanged since 07/11/2013. Moderate thoracolumbar spondylosis. IMPRESSION: 1. Moderate diffuse  colonic diverticulosis, most prominent in the sigmoid colon. Segmental wall thickening in the mid to distal sigmoid colon. 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 These findings are most compatible with chronic diverticular disease. Neoplasm is considered less likely. Surgical consultation advised. 2. No measurable / drainable abscess at this time. 3. No evidence of bowel obstruction. 4. Moderate L2, L4 and  L5 vertebral compression fractures, worsened at L2, new at L4 and stable left L5 since 07/11/2013 lumbar spine MRI. 5. Additional findings include Aortic Atherosclerosis (ICD10-I70.0) and Emphysema (ICD10-J43.9), cardiomegaly and cholelithiasis. Electronically Signed   By: Ilona Sorrel M.D.   On: 12/06/2016 11:47    ASSESSMENT & PLAN:   Primary cancer of left upper lobe of lung ([place]) # LEFT UPPER LOBE LUNG CA- STAGE I; [malignancy-based on imaging; poor candidate for biopsy]; S/p SBRT June 26th. Most recent CT scan- June 2018- slight increase in size of the left upper lobe lung nodule; however no new nodules.   # New-onset of IDA-July 2018 hemoglobin 7.3 ferritin 6 saturation 2%;  Unclear etiology. Currently status post 8 Venofer IV iron infusions. Hemoglobin 11.3 today. Some improvement in fatigue, pallor, and sob.   #Melena- Dr. Rogue Bussing discussed patient with Dr. Gustavo Lah. Last EGD colonoscopy 2010-gastritis/polyps. Evaluated in GI office in 2013-iron deficiency anemia; thought to be high risk for invasive procedures. GI recommended avoidance of etoh & PPI. Pt to f/u w/ GI 01/17/17.  # COPD-severe. Symptomatic but stable. 3L oxygen. Mobility limited d/t sob. Pt reports mild sob improvement with IV iron.   # Wheel chair/shower chair-is recommended given patient's difficulty with ADLs.   # Venofer today, venofer in 1 week, venofer in 2 weeks then rtc in 6 weeks for labs and consideration of IV venofer    Beckey Rutter, DNP AGNP-C 01/02/17 3:48 PM      Verlon Au, NP 01/02/2017 3:57 PM

## 2017-01-09 ENCOUNTER — Ambulatory Visit (INDEPENDENT_AMBULATORY_CARE_PROVIDER_SITE_OTHER): Payer: Medicare Other | Admitting: Family Medicine

## 2017-01-09 ENCOUNTER — Ambulatory Visit: Payer: Medicare Other | Admitting: Family Medicine

## 2017-01-09 ENCOUNTER — Encounter: Payer: Self-pay | Admitting: Family Medicine

## 2017-01-09 ENCOUNTER — Other Ambulatory Visit: Payer: Self-pay | Admitting: Family Medicine

## 2017-01-09 DIAGNOSIS — J449 Chronic obstructive pulmonary disease, unspecified: Secondary | ICD-10-CM | POA: Diagnosis not present

## 2017-01-09 DIAGNOSIS — D509 Iron deficiency anemia, unspecified: Secondary | ICD-10-CM | POA: Diagnosis not present

## 2017-01-09 DIAGNOSIS — Z23 Encounter for immunization: Secondary | ICD-10-CM

## 2017-01-09 DIAGNOSIS — R600 Localized edema: Secondary | ICD-10-CM

## 2017-01-09 NOTE — Patient Instructions (Signed)
Nice to see you. Please continue on your oxygen and to use your COPD medications. Please keep your appointment with pulmonology next week. Please continue to follow with hematology for iron transfusions. If you develop issues with breathing or develop increased productive cough or wheezing or fevers please be evaluated.

## 2017-01-09 NOTE — Assessment & Plan Note (Signed)
Suspect venous stasis. No orthopnea. Occasionally takes Lasix. Discussed compression and elevation. Try to minimize use of Lasix.

## 2017-01-09 NOTE — Assessment & Plan Note (Signed)
Following with hematology for this. He'll finish iron therapy through them. Continue to follow with GI.

## 2017-01-09 NOTE — Progress Notes (Signed)
  Tommi Rumps, MD Phone: [phone]  Peter Johnston is a 76 y.o. male who presents today for follow-up.  COPD: Patient notes he is chronically on 3 L of oxygen. Occasionally has to go up on it to 4 L. Typically runs 89-90% O2 saturations at home. Notes the shortness of breath is unchanged. If he is just sitting there he has no issues. Once he gets up and moves around he has more issues with his breathing though this is stable and unchanged. No coughing. He does report some intermittent wheezing. Uses breo once daily. DuoNeb one time daily. He is on 10 mg of prednisone daily for a couple of years. No fevers.  Found to be iron deficient and anemic previously. He's been following with hematology and has been getting iron transfusions. His anemia has progressively improved. This has helped with his breathing. He saw GI for the iron deficiency anemia and states they felt it may be diverticulosis related. He has had no observed bleeding. He is on Protonix.  Lower extremity edema: Notes this has gone down somewhat fluid pills. Notes it's been chronic. Has not gotten worse.  PMH: Smoker   ROS see history of present illness  Objective  Physical Exam Vitals:   01/09/17 1438  BP: 130/72  Pulse: 81  Temp: 98.2 F (36.8 C)  SpO2: 90%    BP Readings from Last 3 Encounters:  01/09/17 130/72  01/02/17 140/86  01/02/17 125/79   Wt Readings from Last 3 Encounters:  01/09/17 172 lb 12.8 oz (78.4 kg)  01/02/17 169 lb 12.8 oz (77 kg)  12/05/16 182 lb (82.6 kg)    Physical Exam  Constitutional: No distress.  Cardiovascular: Normal rate, regular rhythm and normal heart sounds.   Pulmonary/Chest: Effort normal and breath sounds normal.  Musculoskeletal:  1+ pitting edema bilateral lower extremities  Neurological: He is alert.  Skin: Skin is warm and dry. He is not diaphoretic.     Assessment/Plan: Please see individual problem list.  COPD, severe ([place]) Chronic. Stable. He  requested a treatment with increased dose prednisone though he does not appear to have any signs or symptoms of COPD exacerbation. Discussed that this would not be of any benefit. He'll continue his current regimen.  Iron deficiency anemia Following with hematology for this. He'll finish iron therapy through them. Continue to follow with GI.  Lower extremity edema Suspect venous stasis. No orthopnea. Occasionally takes Lasix. Discussed compression and elevation. Try to minimize use of Lasix.   Orders Placed This Encounter  Procedures  . Flu vaccine HIGH DOSE PF    Patient's portable oxygen system shut off while he was in the office. There is a message stating that it had overheated. He is placed on oxygen in the office and at the end of the visit turned his machine back on. It came on and was at 87% badly. He was observed on this for 15-20 minutes and it was functioning normally. He was allowed to leave at that point. I did discuss that the patient should not use electronic cigarettes while using his oxygen as there is some risk of igniting the oxygen.  Tommi Rumps, MD [place]

## 2017-01-09 NOTE — Assessment & Plan Note (Signed)
Chronic. Stable. He requested a treatment with increased dose prednisone though he does not appear to have any signs or symptoms of COPD exacerbation. Discussed that this would not be of any benefit. He'll continue his current regimen.

## 2017-01-10 ENCOUNTER — Inpatient Hospital Stay: Payer: Medicare Other

## 2017-01-10 VITALS — BP 131/73 | HR 82 | Resp 20

## 2017-01-10 DIAGNOSIS — C3412 Malignant neoplasm of upper lobe, left bronchus or lung: Secondary | ICD-10-CM | POA: Diagnosis not present

## 2017-01-10 DIAGNOSIS — D500 Iron deficiency anemia secondary to blood loss (chronic): Secondary | ICD-10-CM

## 2017-01-10 MED ORDER — IRON SUCROSE 20 MG/ML IV SOLN
200.0000 mg | Freq: Once | INTRAVENOUS | Status: AC
  Administered 2017-01-10: 200 mg via INTRAVENOUS
  Filled 2017-01-10: qty 10

## 2017-01-10 MED ORDER — SODIUM CHLORIDE 0.9 % IV SOLN
Freq: Once | INTRAVENOUS | Status: AC
  Administered 2017-01-10: 14:00:00 via INTRAVENOUS
  Filled 2017-01-10: qty 1000

## 2017-01-16 ENCOUNTER — Other Ambulatory Visit: Payer: Self-pay | Admitting: Internal Medicine

## 2017-01-16 ENCOUNTER — Inpatient Hospital Stay: Payer: Medicare Other

## 2017-01-16 VITALS — BP 134/74 | HR 84 | Temp 96.8°F | Resp 24

## 2017-01-16 DIAGNOSIS — D500 Iron deficiency anemia secondary to blood loss (chronic): Secondary | ICD-10-CM

## 2017-01-16 DIAGNOSIS — C3412 Malignant neoplasm of upper lobe, left bronchus or lung: Secondary | ICD-10-CM | POA: Diagnosis not present

## 2017-01-16 MED ORDER — SODIUM CHLORIDE 0.9 % IV SOLN
Freq: Once | INTRAVENOUS | Status: AC
  Administered 2017-01-16: 13:00:00 via INTRAVENOUS
  Filled 2017-01-16: qty 1000

## 2017-01-16 MED ORDER — IRON SUCROSE 20 MG/ML IV SOLN
200.0000 mg | Freq: Once | INTRAVENOUS | Status: AC
  Administered 2017-01-16: 200 mg via INTRAVENOUS
  Filled 2017-01-16: qty 10

## 2017-02-13 ENCOUNTER — Inpatient Hospital Stay (HOSPITAL_BASED_OUTPATIENT_CLINIC_OR_DEPARTMENT_OTHER): Payer: Medicare Other | Admitting: Internal Medicine

## 2017-02-13 ENCOUNTER — Inpatient Hospital Stay: Payer: Medicare Other | Attending: Internal Medicine

## 2017-02-13 ENCOUNTER — Inpatient Hospital Stay: Payer: Medicare Other

## 2017-02-13 VITALS — BP 152/86 | HR 88

## 2017-02-13 VITALS — BP 145/78 | HR 83 | Temp 98.7°F | Resp 16 | Wt 178.0 lb

## 2017-02-13 DIAGNOSIS — Z79899 Other long term (current) drug therapy: Secondary | ICD-10-CM

## 2017-02-13 DIAGNOSIS — R0602 Shortness of breath: Secondary | ICD-10-CM | POA: Diagnosis not present

## 2017-02-13 DIAGNOSIS — M129 Arthropathy, unspecified: Secondary | ICD-10-CM | POA: Diagnosis not present

## 2017-02-13 DIAGNOSIS — C3412 Malignant neoplasm of upper lobe, left bronchus or lung: Secondary | ICD-10-CM

## 2017-02-13 DIAGNOSIS — J449 Chronic obstructive pulmonary disease, unspecified: Secondary | ICD-10-CM | POA: Diagnosis not present

## 2017-02-13 DIAGNOSIS — F17210 Nicotine dependence, cigarettes, uncomplicated: Secondary | ICD-10-CM | POA: Insufficient documentation

## 2017-02-13 DIAGNOSIS — D509 Iron deficiency anemia, unspecified: Secondary | ICD-10-CM | POA: Insufficient documentation

## 2017-02-13 DIAGNOSIS — I10 Essential (primary) hypertension: Secondary | ICD-10-CM

## 2017-02-13 DIAGNOSIS — Z923 Personal history of irradiation: Secondary | ICD-10-CM

## 2017-02-13 DIAGNOSIS — D500 Iron deficiency anemia secondary to blood loss (chronic): Secondary | ICD-10-CM

## 2017-02-13 LAB — CBC WITH DIFFERENTIAL/PLATELET
Basophils Absolute: 0.1 10*3/uL (ref 0–0.1)
Basophils Relative: 1 %
EOS PCT: 2 %
Eosinophils Absolute: 0.2 10*3/uL (ref 0–0.7)
HEMATOCRIT: 38.6 % — AB (ref 40.0–52.0)
Hemoglobin: 12 g/dL — ABNORMAL LOW (ref 13.0–18.0)
LYMPHS PCT: 13 %
Lymphs Abs: 1.4 10*3/uL (ref 1.0–3.6)
MCH: 26.3 pg (ref 26.0–34.0)
MCHC: 31.1 g/dL — AB (ref 32.0–36.0)
MCV: 84.6 fL (ref 80.0–100.0)
Monocytes Absolute: 1 10*3/uL (ref 0.2–1.0)
Monocytes Relative: 9 %
NEUTROS ABS: 8.3 10*3/uL — AB (ref 1.4–6.5)
NEUTROS PCT: 75 %
PLATELETS: 200 10*3/uL (ref 150–440)
RBC: 4.56 MIL/uL (ref 4.40–5.90)
RDW: 22 % — AB (ref 11.5–14.5)
WBC: 10.9 10*3/uL — AB (ref 3.8–10.6)

## 2017-02-13 LAB — COMPREHENSIVE METABOLIC PANEL
ALT: 15 U/L — ABNORMAL LOW (ref 17–63)
ANION GAP: 6 (ref 5–15)
AST: 23 U/L (ref 15–41)
Albumin: 3.8 g/dL (ref 3.5–5.0)
Alkaline Phosphatase: 42 U/L (ref 38–126)
BILIRUBIN TOTAL: 0.3 mg/dL (ref 0.3–1.2)
BUN: 18 mg/dL (ref 6–20)
CHLORIDE: 99 mmol/L — AB (ref 101–111)
CO2: 34 mmol/L — ABNORMAL HIGH (ref 22–32)
Calcium: 9 mg/dL (ref 8.9–10.3)
Creatinine, Ser: 0.87 mg/dL (ref 0.61–1.24)
Glucose, Bld: 117 mg/dL — ABNORMAL HIGH (ref 65–99)
POTASSIUM: 3.9 mmol/L (ref 3.5–5.1)
Sodium: 139 mmol/L (ref 135–145)
TOTAL PROTEIN: 7.4 g/dL (ref 6.5–8.1)

## 2017-02-13 LAB — IRON AND TIBC
IRON: 63 ug/dL (ref 45–182)
SATURATION RATIOS: 16 % — AB (ref 17.9–39.5)
TIBC: 386 ug/dL (ref 250–450)
UIBC: 323 ug/dL

## 2017-02-13 LAB — FERRITIN: FERRITIN: 101 ng/mL (ref 24–336)

## 2017-02-13 MED ORDER — IRON SUCROSE 20 MG/ML IV SOLN
200.0000 mg | Freq: Once | INTRAVENOUS | Status: AC
  Administered 2017-02-13: 200 mg via INTRAVENOUS
  Filled 2017-02-13: qty 10

## 2017-02-13 MED ORDER — SODIUM CHLORIDE 0.9 % IV SOLN
Freq: Once | INTRAVENOUS | Status: AC
Start: 2017-02-13 — End: 2017-02-13
  Administered 2017-02-13: 13:00:00 via INTRAVENOUS
  Filled 2017-02-13: qty 1000

## 2017-02-13 NOTE — Assessment & Plan Note (Addendum)
#   LEFT UPPER LOBE LUNG CA- STAGE I; [malignancy-based on imaging; poor candidate for biopsy]; S/p SBRT June 26th. Most recent CT scan- June 2018- slight increase in size of the left upper lobe lung nodule.  No obvious clinical evidence of progression of lung cancer. Awaiting CT scan in December 2018.   # New-onset of IDA-July 2018 hemoglobin 7.3 ferritin 6 saturation 2%; status post IV iron; hemoglobin today is 12.  Iron studies pending.  I would recommend Venofer again today.  # COPD-severe. Symptomatic but stable. 3L oxygen.  Defer to pulmonary for further recommendations.  #Follow-up in approximately middle of December; labs; possible Venofer; CT scan prior as ordered.

## 2017-02-13 NOTE — Progress Notes (Signed)
[facility] NOTE  Patient Care Team: Leone Haven, MD as PCP - General (Family Medicine) Allyne Gee, MD (Pulmonary Disease) Lollie Sails, MD as Consulting Physician (Gastroenterology)  CHIEF COMPLAINTS/PURPOSE OF CONSULTATION:   Oncology History   # April 2016-LUL LUNG CA [clinical; no Bx sec to high risk] LUL LUNG NODULE [33mm]; MAY 2017- 27mm; PET avid- no distant mets s/p SBRT [end of June 2017];   # NOV 22nd CT scan- Improved LUL nodule  #Iron deficiency anemia-September 2018 [poor candidate for EGD colonoscopy; Dr.Skulskie] ? Gastritis vs AV malformation  # COPD- home O2 [Dr.Saadat Khan]     Primary cancer of left upper lobe of lung ([place])   11/09/2015 Initial Diagnosis    Primary cancer of left upper lobe of lung ([place])        HISTORY OF PRESENTING ILLNESS:  Peter Johnston 76 y.o.  male with history of left upper lobe lung nodule growing in size, s/p SB RT ended June 2017, advanced COPD on 3L O2, who continues to smoke, arrives today for f/u for iron deficiency anemia d/t chronic blood loss.  Is currently status post IV iron.  He denies taking any black colored stools.  Appetite is fair. He denies abdominal pain.  Denies any new onset of constipation or diarrhea.He is accompanied today by his sister.  He continues to have chronic shortness of breath on home O2.  He continues to have chronic cough.   ROS: A complete 10 point review of system is done which is negative except mentioned above in history of present illness  MEDICAL HISTORY:  Past Medical History:  Diagnosis Date  . Arthritis   . Asthma   . COPD (chronic obstructive pulmonary disease) ([place])   . Hypertension   . IDA (iron deficiency anemia)   . Lung mass     SURGICAL HISTORY: Past Surgical History:  Procedure Laterality Date  . ABDOMINAL HERNIA REPAIR  1995   Dr. Rochel Brome  . [place]  2011, 2012   x 2- Dr. Elvina Mattes    SOCIAL HISTORY: retired with [employer]; [place]; with girl friend; and son.  Social History   Socioeconomic History  . Marital status: Widowed    Spouse name: Not on file  . Number of children: 2  . Years of education: Not on file  . Highest education level: Not on file  Social Needs  . Financial resource strain: Not on file  . Food insecurity - worry: Not on file  . Food insecurity - inability: Not on file  . Transportation needs - medical: Not on file  . Transportation needs - non-medical: Not on file  Occupational History  . Occupation: Retired   Tobacco Use  . Smoking status: Former Smoker    Packs/day: 2.00    Years: 63.00    Pack years: 126.00    Types: Cigarettes    Last attempt to quit: 09/13/2015    Years since quitting: 1.4  . Smokeless tobacco: Never Used  . Tobacco comment: Stopped smoking cigarettes Sunday and uses the vapor e-cig to help with cravings  Substance and Sexual Activity  . Alcohol use: Yes    Alcohol/week: 0.0 oz    Comment: 2-3 beers at night  . Drug use: No  . Sexual activity: No  Other Topics Concern  . Not on file  Social History Narrative   Lives in [place] with son.      2 children, 2  grandchildren             FAMILY HISTORY: Family History  Problem Relation Age of Onset  . Stroke Father   . Heart disease Father   . Hypertension Father   . Heart disease Brother 67    ALLERGIES:  is allergic to tamsulosin; gold-containing drug products; hydrochlorothiazide; and sulfa antibiotics.  MEDICATIONS:  Current Outpatient Medications  Medication Sig Dispense Refill  . albuterol (PROVENTIL HFA;VENTOLIN HFA) 108 (90 Base) MCG/ACT inhaler Inhale 2 puffs into the lungs every 6 (six) hours as needed for wheezing or shortness of breath. 1 Inhaler 2  . allopurinol (ZYLOPRIM) 300 MG tablet Take 300 mg by mouth daily.    Marland Kitchen atenolol (TENORMIN) 25 MG tablet TAKE 1 TABLET DAILY 90 tablet 3  . brimonidine (ALPHAGAN) 0.2 % ophthalmic solution Place 1 drop into both eyes 2 (two)  times daily.    . ENBREL 50 MG/ML injection 50 mg once a week.     . fenofibrate (TRICOR) 145 MG tablet TAKE 1 TABLET DAILY 90 tablet 3  . Fluticasone Furoate-Vilanterol (BREO ELLIPTA) 100-25 MCG/INH AEPB Inhale 1 puff into the lungs daily. 60 each 6  . furosemide (LASIX) 20 MG tablet Take 1 tablet by mouth as needed for edema.    . hydroxychloroquine (PLAQUENIL) 200 MG tablet Take 200 mg by mouth daily.    Marland Kitchen ipratropium-albuterol (DUONEB) 0.5-2.5 (3) MG/3ML SOLN Inhale 3 mLs into the lungs every 4 (four) hours as needed. 90 mL 3  . latanoprost (XALATAN) 0.005 % ophthalmic solution Place 1 drop into both eyes at bedtime.    . pantoprazole (PROTONIX) 40 MG tablet Take 1 tablet (40 mg total) by mouth 2 (two) times daily before a meal. 60 tablet 3  . predniSONE (DELTASONE) 10 MG tablet Take 10 mg by mouth daily with breakfast.    . theophylline (UNIPHYL) 400 MG 24 hr tablet Take 400 mg by mouth daily.     . traMADol (ULTRAM) 50 MG tablet TAKE ONE TABLET BY MOUTH EVERY 12 HOURS AS NEEDED 60 tablet 0  . TRAVATAN Z 0.004 % SOLN ophthalmic solution Place 1 drop into both eyes at bedtime.     . traZODone (DESYREL) 50 MG tablet Take 0.5-1 tablets (25-50 mg total) by mouth at bedtime as needed for sleep. 30 tablet 3   No current facility-administered medications for this visit.    Facility-Administered Medications Ordered in Other Visits  Medication Dose Route Frequency Provider Last Rate Last Dose  . iron sucrose (VENOFER) 200 mg IVPB  200 mg Intravenous Once Charlaine Dalton R, MD 120 mL/hr at 02/13/17 1315 200 mg at 02/13/17 1315    PHYSICAL EXAMINATION: ECOG PERFORMANCE STATUS: 2 - Symptomatic, <50% confined to bed  Vitals:   02/13/17 1207 02/13/17 1221  BP: (!) 145/78   Pulse: 83   Resp: 16   Temp: 98.7 F (37.1 C)   SpO2:  92%   Filed Weights   02/13/17 1207  Weight: 178 lb (80.7 kg)    GENERAL: Well-nourished well-developed; Alert, on 3L oxygen via nasal cannula. He is  accompanied by his sister. He is sitting in wheel chair.   EYES: Pallor has improved compared to previous visits OROPHARYNX: no thrush or ulceration.  NECK: supple, no masses felt LYMPH:  no palpable lymphadenopathy in the cervical nodes LUNGS: Bilateral decreased air entry; positive for bilateral wheeze/coarse breath sounds. HEART/CVS: regular rate & rhythm and no murmurs; No lower extremity edema ABDOMEN: abdomen soft, non-tender and normal  bowel sounds. Obese. Musculoskeletal:no cyanosis of digits and no clubbing; barrel chested. PSYCH: alert & oriented x 3 with fluent speech NEURO: no focal motor/sensory deficits SKIN:  no rashes or significant lesions  LABORATORY DATA:  I have reviewed the data as listed Lab Results  Component Value Date   WBC 10.9 (H) 02/13/2017   HGB 12.0 (L) 02/13/2017   HCT 38.6 (L) 02/13/2017   MCV 84.6 02/13/2017   PLT 200 02/13/2017   Recent Labs    10/07/16 1518 12/05/16 1335 02/13/17 1117  NA 141 138 139  K 4.1 4.5 3.9  CL 98 101 99*  CO2 36* 30 34*  GLUCOSE 107* 127* 117*  BUN 25* 23* 18  CREATININE 1.03 1.27* 0.87  CALCIUM 9.2 8.9 9.0  GFRNONAA  --  53* >60  GFRAA  --  >60 >60  PROT 6.4 6.9 7.4  ALBUMIN 3.5 3.6 3.8  AST 15 21 23   ALT 10 17 15*  ALKPHOS 39 36* 42  BILITOT 0.3 0.5 0.3    RADIOGRAPHIC STUDIES: I have personally reviewed the radiological images as listed and agreed with the findings in the report. No results found.  ASSESSMENT & PLAN:   Primary cancer of left upper lobe of lung ([place]) # LEFT UPPER LOBE LUNG CA- STAGE I; [malignancy-based on imaging; poor candidate for biopsy]; S/p SBRT June 26th. Most recent CT scan- June 2018- slight increase in size of the left upper lobe lung nodule.  No obvious clinical evidence of progression of lung cancer. Awaiting CT scan in December 2018.   # New-onset of IDA-July 2018 hemoglobin 7.3 ferritin 6 saturation 2%; status post IV iron; hemoglobin today is 12.  Iron studies pending.   I would recommend Venofer again today.  # COPD-severe. Symptomatic but stable. 3L oxygen.  Defer to pulmonary for further recommendations.  #Follow-up in approximately middle of December; labs; possible Venofer; CT scan prior as ordered.      Cammie Sickle, MD 02/13/2017 1:18 PM

## 2017-03-08 ENCOUNTER — Ambulatory Visit
Admission: RE | Admit: 2017-03-08 | Discharge: 2017-03-08 | Disposition: A | Discharge status: Home / Self Care | Payer: Medicare Other | Source: Ambulatory Visit | Attending: Hematology and Oncology | Admitting: Hematology and Oncology

## 2017-03-08 DIAGNOSIS — I2510 Atherosclerotic heart disease of native coronary artery without angina pectoris: Secondary | ICD-10-CM | POA: Insufficient documentation

## 2017-03-08 DIAGNOSIS — K8020 Calculus of gallbladder without cholecystitis without obstruction: Secondary | ICD-10-CM | POA: Diagnosis not present

## 2017-03-08 DIAGNOSIS — J439 Emphysema, unspecified: Secondary | ICD-10-CM | POA: Diagnosis not present

## 2017-03-08 DIAGNOSIS — I700 Atherosclerosis of aorta: Secondary | ICD-10-CM | POA: Insufficient documentation

## 2017-03-08 DIAGNOSIS — C3412 Malignant neoplasm of upper lobe, left bronchus or lung: Secondary | ICD-10-CM | POA: Diagnosis present

## 2017-03-10 ENCOUNTER — Inpatient Hospital Stay: Payer: Medicare Other | Attending: Internal Medicine

## 2017-03-10 ENCOUNTER — Other Ambulatory Visit: Payer: Self-pay

## 2017-03-10 ENCOUNTER — Inpatient Hospital Stay: Payer: Medicare Other

## 2017-03-10 ENCOUNTER — Inpatient Hospital Stay (HOSPITAL_BASED_OUTPATIENT_CLINIC_OR_DEPARTMENT_OTHER): Payer: Medicare Other | Admitting: Internal Medicine

## 2017-03-10 VITALS — BP 128/92 | HR 76 | Temp 97.8°F | Resp 20 | Ht 67.0 in | Wt 174.0 lb

## 2017-03-10 DIAGNOSIS — I10 Essential (primary) hypertension: Secondary | ICD-10-CM | POA: Diagnosis not present

## 2017-03-10 DIAGNOSIS — Z923 Personal history of irradiation: Secondary | ICD-10-CM | POA: Diagnosis not present

## 2017-03-10 DIAGNOSIS — F17210 Nicotine dependence, cigarettes, uncomplicated: Secondary | ICD-10-CM | POA: Insufficient documentation

## 2017-03-10 DIAGNOSIS — R0602 Shortness of breath: Secondary | ICD-10-CM

## 2017-03-10 DIAGNOSIS — J449 Chronic obstructive pulmonary disease, unspecified: Secondary | ICD-10-CM | POA: Diagnosis not present

## 2017-03-10 DIAGNOSIS — M129 Arthropathy, unspecified: Secondary | ICD-10-CM

## 2017-03-10 DIAGNOSIS — Z79899 Other long term (current) drug therapy: Secondary | ICD-10-CM | POA: Insufficient documentation

## 2017-03-10 DIAGNOSIS — J45909 Unspecified asthma, uncomplicated: Secondary | ICD-10-CM | POA: Insufficient documentation

## 2017-03-10 DIAGNOSIS — D500 Iron deficiency anemia secondary to blood loss (chronic): Secondary | ICD-10-CM

## 2017-03-10 DIAGNOSIS — R5383 Other fatigue: Secondary | ICD-10-CM | POA: Insufficient documentation

## 2017-03-10 DIAGNOSIS — Z9981 Dependence on supplemental oxygen: Secondary | ICD-10-CM | POA: Diagnosis not present

## 2017-03-10 DIAGNOSIS — D509 Iron deficiency anemia, unspecified: Secondary | ICD-10-CM | POA: Insufficient documentation

## 2017-03-10 DIAGNOSIS — C3412 Malignant neoplasm of upper lobe, left bronchus or lung: Secondary | ICD-10-CM | POA: Diagnosis not present

## 2017-03-10 LAB — CBC WITH DIFFERENTIAL/PLATELET
Basophils Absolute: 0.1 10*3/uL (ref 0–0.1)
Basophils Relative: 1 %
EOS ABS: 0.1 10*3/uL (ref 0–0.7)
Eosinophils Relative: 1 %
HEMATOCRIT: 40.9 % (ref 40.0–52.0)
HEMOGLOBIN: 12.8 g/dL — AB (ref 13.0–18.0)
LYMPHS ABS: 0.8 10*3/uL — AB (ref 1.0–3.6)
LYMPHS PCT: 8 %
MCH: 27.3 pg (ref 26.0–34.0)
MCHC: 31.3 g/dL — AB (ref 32.0–36.0)
MCV: 87.1 fL (ref 80.0–100.0)
MONOS PCT: 4 %
Monocytes Absolute: 0.4 10*3/uL (ref 0.2–1.0)
NEUTROS ABS: 9.1 10*3/uL — AB (ref 1.4–6.5)
NEUTROS PCT: 88 %
Platelets: 210 10*3/uL (ref 150–440)
RBC: 4.69 MIL/uL (ref 4.40–5.90)
RDW: 20.5 % — ABNORMAL HIGH (ref 11.5–14.5)
WBC: 10.5 10*3/uL (ref 3.8–10.6)

## 2017-03-10 MED ORDER — SODIUM CHLORIDE 0.9 % IV SOLN
Freq: Once | INTRAVENOUS | Status: AC
  Administered 2017-03-10: 16:00:00 via INTRAVENOUS
  Filled 2017-03-10: qty 1000

## 2017-03-10 MED ORDER — IRON SUCROSE 20 MG/ML IV SOLN
200.0000 mg | Freq: Once | INTRAVENOUS | Status: AC
  Administered 2017-03-10: 200 mg via INTRAVENOUS
  Filled 2017-03-10: qty 10

## 2017-03-10 NOTE — Assessment & Plan Note (Signed)
#   LEFT UPPER LOBE LUNG CA- STAGE I; [malignancy-based on imaging; poor candidate for biopsy]; S/p SBRT June 2017.  #CT scan March 08, 2017-stable 11 mm left upper lobe lung nodule.  A separate 3 mm nodule noted.  Recommend follow-up CT scan in 6 months.  Will order at next visit   # New-onset of IDA-July 2018-poor candidate for colonoscopy discussed with Dr. Gustavo Lah; improved with IV iron.  Today hemoglobin is 12.5.  Proceed with IV iron infusion.   # COPD-severe. Symptomatic but stable. 3L oxygen.  Defer to pulmonary for further recommendations.  #Follow-up in 3 months labs and studies/possible Venofer.  # I reviewed the blood work- with the patient in detail; also reviewed the imaging independently [as summarized above]; and with the patient in detail.

## 2017-03-10 NOTE — Progress Notes (Signed)
[facility] NOTE  Patient Care Team: Leone Haven, MD as PCP - General (Family Medicine) Allyne Gee, MD (Pulmonary Disease) Lollie Sails, MD as Consulting Physician (Gastroenterology)  CHIEF COMPLAINTS/PURPOSE OF CONSULTATION:   Oncology History   # April 2016-LUL LUNG CA [clinical; no Bx sec to high risk] LUL LUNG NODULE [68mm]; MAY 2017- 26mm; PET avid- no distant mets s/p SBRT [end of June 2017];   # NOV 22nd CT scan- Improved LUL nodule  #Iron deficiency anemia-September 2018 [poor candidate for EGD colonoscopy; Dr.Skulskie] ? Gastritis vs AV malformation  # COPD- home O2 [Dr.Saadat Khan]     Primary cancer of left upper lobe of lung ([place])   11/09/2015 Initial Diagnosis    Primary cancer of left upper lobe of lung ([place])      HISTORY OF PRESENTING ILLNESS:  Peter Johnston 76 y.o.  male stage I LUL nodule s/p SB RT ended June 2017, advanced COPD on 3L O2, who continues to smoke, f/u for iron deficiency anemia.  Is currently status post IV iron.  Patient continues to have chronic shortness of breath on home O2. Chronic fatigue. He denies taking any black colored stools.  Appetite is fair. He denies abdominal pain. No nausea/ vomiting.   ROS: A complete 10 point review of system is done which is negative except mentioned above in history of present illness  MEDICAL HISTORY:  Past Medical History:  Diagnosis Date  . Arthritis   . Asthma   . COPD (chronic obstructive pulmonary disease) ([place])   . Hypertension   . IDA (iron deficiency anemia)   . Lung mass     SURGICAL HISTORY: Past Surgical History:  Procedure Laterality Date  . ABDOMINAL HERNIA REPAIR  1995   Dr. Rochel Brome  . [place]  2011, 2012   x 2- Dr. Elvina Mattes    SOCIAL HISTORY: retired with [employer]; [place]; with girl friend; and son.  Social History   Socioeconomic History  . Marital status: Widowed    Spouse name: Not on file  . Number of children: 2   . Years of education: Not on file  . Highest education level: Not on file  Social Needs  . Financial resource strain: Not on file  . Food insecurity - worry: Not on file  . Food insecurity - inability: Not on file  . Transportation needs - medical: Not on file  . Transportation needs - non-medical: Not on file  Occupational History  . Occupation: Retired   Tobacco Use  . Smoking status: Former Smoker    Packs/day: 2.00    Years: 63.00    Pack years: 126.00    Types: Cigarettes    Last attempt to quit: 09/13/2015    Years since quitting: 1.4  . Smokeless tobacco: Never Used  . Tobacco comment: Stopped smoking cigarettes Sunday and uses the vapor e-cig to help with cravings  Substance and Sexual Activity  . Alcohol use: Yes    Alcohol/week: 0.0 oz    Comment: 2-3 beers at night  . Drug use: No  . Sexual activity: No  Other Topics Concern  . Not on file  Social History Narrative   Lives in [place] with son.      2 children, 2 grandchildren             FAMILY HISTORY: Family History  Problem Relation Age of Onset  . Stroke Father   . Heart disease Father   .  Hypertension Father   . Heart disease Brother 103    ALLERGIES:  is allergic to tamsulosin; gold-containing drug products; hydrochlorothiazide; and sulfa antibiotics.  MEDICATIONS:  Current Outpatient Medications  Medication Sig Dispense Refill  . albuterol (PROVENTIL HFA;VENTOLIN HFA) 108 (90 Base) MCG/ACT inhaler Inhale 2 puffs into the lungs every 6 (six) hours as needed for wheezing or shortness of breath. 1 Inhaler 2  . allopurinol (ZYLOPRIM) 300 MG tablet Take 300 mg by mouth daily.    Marland Kitchen atenolol (TENORMIN) 25 MG tablet TAKE 1 TABLET DAILY 90 tablet 3  . brimonidine (ALPHAGAN) 0.2 % ophthalmic solution Place 1 drop into both eyes 2 (two) times daily.    . ENBREL 50 MG/ML injection 50 mg once a week.     . fenofibrate (TRICOR) 145 MG tablet TAKE 1 TABLET DAILY 90 tablet 3  . Fluticasone  Furoate-Vilanterol (BREO ELLIPTA) 100-25 MCG/INH AEPB Inhale 1 puff into the lungs daily. 60 each 6  . furosemide (LASIX) 20 MG tablet Take 1 tablet by mouth as needed for edema.    . hydroxychloroquine (PLAQUENIL) 200 MG tablet Take 200 mg by mouth daily.    Marland Kitchen ipratropium-albuterol (DUONEB) 0.5-2.5 (3) MG/3ML SOLN Inhale 3 mLs into the lungs every 4 (four) hours as needed. 90 mL 3  . latanoprost (XALATAN) 0.005 % ophthalmic solution Place 1 drop into both eyes at bedtime.    . pantoprazole (PROTONIX) 40 MG tablet Take 1 tablet (40 mg total) by mouth 2 (two) times daily before a meal. 60 tablet 3  . predniSONE (DELTASONE) 10 MG tablet Take 10 mg by mouth daily with breakfast.    . theophylline (UNIPHYL) 400 MG 24 hr tablet Take 400 mg by mouth daily.     . traMADol (ULTRAM) 50 MG tablet TAKE ONE TABLET BY MOUTH EVERY 12 HOURS AS NEEDED 60 tablet 0  . TRAVATAN Z 0.004 % SOLN ophthalmic solution Place 1 drop into both eyes at bedtime.     . traZODone (DESYREL) 50 MG tablet Take 0.5-1 tablets (25-50 mg total) by mouth at bedtime as needed for sleep. 30 tablet 3   No current facility-administered medications for this visit.    Facility-Administered Medications Ordered in Other Visits  Medication Dose Route Frequency Provider Last Rate Last Dose  . 0.9 %  sodium chloride infusion   Intravenous Once Charlaine Dalton R, MD      . iron sucrose (VENOFER) 200 mg IVPB  200 mg Intravenous Once Cammie Sickle, MD        PHYSICAL EXAMINATION: ECOG PERFORMANCE STATUS: 2 - Symptomatic, <50% confined to bed  Vitals:   03/10/17 1516  BP: (!) 128/92  Pulse: 76  Resp: 20  Temp: 97.8 F (36.6 C)  SpO2: 90%   Filed Weights   03/10/17 1516  Weight: 174 lb (78.9 kg)    GENERAL: Well-nourished well-developed; Alert, on 3L oxygen via nasal cannula. He is accompanied by his son. He is sitting in wheel chair.   EYES: Pallor has improved compared to previous visits OROPHARYNX: no thrush or  ulceration.  NECK: supple, no masses felt LYMPH:  no palpable lymphadenopathy in the cervical nodes LUNGS: Bilateral decreased air entry; positive for bilateral wheeze/coarse breath sounds. HEART/CVS: regular rate & rhythm and no murmurs; No lower extremity edema ABDOMEN: abdomen soft, non-tender and normal bowel sounds. Obese. Musculoskeletal:no cyanosis of digits and no clubbing; barrel chested. PSYCH: alert & oriented x 3 with fluent speech NEURO: no focal motor/sensory deficits SKIN:  no  rashes or significant lesions  LABORATORY DATA:  I have reviewed the data as listed Lab Results  Component Value Date   WBC 10.5 03/10/2017   HGB 12.8 (L) 03/10/2017   HCT 40.9 03/10/2017   MCV 87.1 03/10/2017   PLT 210 03/10/2017   Recent Labs    10/07/16 1518 12/05/16 1335 02/13/17 1117  NA 141 138 139  K 4.1 4.5 3.9  CL 98 101 99*  CO2 36* 30 34*  GLUCOSE 107* 127* 117*  BUN 25* 23* 18  CREATININE 1.03 1.27* 0.87  CALCIUM 9.2 8.9 9.0  GFRNONAA  --  53* >60  GFRAA  --  >60 >60  PROT 6.4 6.9 7.4  ALBUMIN 3.5 3.6 3.8  AST 15 21 23   ALT 10 17 15*  ALKPHOS 39 36* 42  BILITOT 0.3 0.5 0.3    RADIOGRAPHIC STUDIES: I have personally reviewed the radiological images as listed and agreed with the findings in the report. Ct Chest Wo Contrast  Result Date: 03/08/2017 CLINICAL DATA:  Followup lung cancer. EXAM: CT CHEST WITHOUT CONTRAST TECHNIQUE: Multidetector CT imaging of the chest was performed following the standard protocol without IV contrast. COMPARISON:  09/06/2016 FINDINGS: Cardiovascular: The heart size appears within normal limits. Aortic atherosclerosis noted. Calcification within the LAD and left circumflex and RCA coronary arteries noted. Mediastinum/Nodes: The trachea appears patent and is midline. Normal appearance of the esophagus. No enlarged mediastinal or hilar adenopathy. No axillary or supraclavicular adenopathy. Lungs/Pleura: Advanced changes of bullous emphysema.  Pulmonary nodule in the left upper lobe measures 1.2 cm and is unchanged from previous exam, image 66 of series 3. Stable nodule in the left upper lobe measuring 4 mm, image 58 of series 3. Upper Abdomen: Gallstones. Aortic atherosclerosis. No acute abnormality. Musculoskeletal: Degenerative disc disease noted. IMPRESSION: 1. No acute cardiopulmonary abnormalities. 2. Stable nodular densities within the left upper lobe 3. Advanced changes of bullous emphysema (ICD10-J43.9). 4. Aortic Atherosclerosis (ICD10-I70.0). Three vessel coronary artery calcifications noted. 5. Gallstones. Electronically Signed   By: Kerby Moors M.D.   On: 03/08/2017 15:25    ASSESSMENT & PLAN:   Primary cancer of left upper lobe of lung ([place]) # LEFT UPPER LOBE LUNG CA- STAGE I; [malignancy-based on imaging; poor candidate for biopsy]; S/p SBRT June 2017.  #CT scan March 08, 2017-stable 11 mm left upper lobe lung nodule.  A separate 3 mm nodule noted.  Recommend follow-up CT scan in 6 months.  Will order at next visit   # New-onset of IDA-July 2018-poor candidate for colonoscopy discussed with Dr. Gustavo Lah; improved with IV iron.  Today hemoglobin is 12.5.  Proceed with IV iron infusion.   # COPD-severe. Symptomatic but stable. 3L oxygen.  Defer to pulmonary for further recommendations.  #Follow-up in 3 months labs and studies/possible Venofer.  # I reviewed the blood work- with the patient in detail; also reviewed the imaging independently [as summarized above]; and with the patient in detail.       Cammie Sickle, MD 03/10/2017 4:11 PM

## 2017-04-13 ENCOUNTER — Other Ambulatory Visit: Payer: Self-pay

## 2017-04-13 DIAGNOSIS — R0602 Shortness of breath: Secondary | ICD-10-CM | POA: Diagnosis not present

## 2017-04-13 DIAGNOSIS — J9610 Chronic respiratory failure, unspecified whether with hypoxia or hypercapnia: Secondary | ICD-10-CM | POA: Diagnosis not present

## 2017-04-13 DIAGNOSIS — J449 Chronic obstructive pulmonary disease, unspecified: Secondary | ICD-10-CM | POA: Diagnosis not present

## 2017-04-13 NOTE — Telephone Encounter (Signed)
Last OV 01/09/17 last filed by DR.Cook 07/12/16 90 3rf

## 2017-04-15 MED ORDER — FENOFIBRATE 145 MG PO TABS: 145.0000 mg | ORAL_TABLET | Freq: Every day | ORAL | 3 refills | Status: DC

## 2017-04-18 ENCOUNTER — Encounter: Payer: Self-pay | Admitting: Family Medicine

## 2017-04-18 ENCOUNTER — Ambulatory Visit (INDEPENDENT_AMBULATORY_CARE_PROVIDER_SITE_OTHER): Payer: Medicare Other | Admitting: Family Medicine

## 2017-04-18 ENCOUNTER — Other Ambulatory Visit: Payer: Self-pay

## 2017-04-18 DIAGNOSIS — J449 Chronic obstructive pulmonary disease, unspecified: Secondary | ICD-10-CM

## 2017-04-18 DIAGNOSIS — I10 Essential (primary) hypertension: Secondary | ICD-10-CM | POA: Diagnosis not present

## 2017-04-18 DIAGNOSIS — E785 Hyperlipidemia, unspecified: Secondary | ICD-10-CM | POA: Diagnosis not present

## 2017-04-18 NOTE — Progress Notes (Addendum)
  Peter Rumps, MD Phone: [phone]  Peter Johnston is a 77 y.o. male who presents today for follow-up.  Patient notes his cough is not as much as it used to be.  Some mild chest congestion though not more than usual.  Some dyspnea that is chronic and stable.  He has had to go up on his oxygen to 3-4 L about a month or so ago.  He feels great on this level.  No chest pain.  No lower extremity edema.  No palpitations.  No fevers.  He is continuing on prednisone and using his inhalers.  He has been taking an over-the-counter supplement called Cordyceps.  Wonders if he should be taking his theophylline.  He does see his pulmonologist soon.  Hyperlipidemia: His TriCor is been too expensive.  He has not been taking this in some time now.  Patient reports he does not get around very easily given his chronic medical issues and breathing.  He requests a shower chair as well as a rolling wheelchair.  Social History   Tobacco Use  Smoking Status Former Smoker  . Packs/day: 2.00  . Years: 63.00  . Pack years: 126.00  . Types: Cigarettes  . Last attempt to quit: 09/13/2015  . Years since quitting: 1.6  Smokeless Tobacco Never Used  Tobacco Comment   Stopped smoking cigarettes Sunday and uses the vapor e-cig to help with cravings     ROS see history of present illness  Objective  Physical Exam Vitals:   04/18/17 1337  BP: 120/70  Pulse: 74  Temp: 98.3 F (36.8 C)  SpO2: 95%    BP Readings from Last 3 Encounters:  04/18/17 120/70  03/10/17 (!) 128/92  02/13/17 (!) 152/86   Wt Readings from Last 3 Encounters:  04/18/17 172 lb 9.6 oz (78.3 kg)  03/10/17 174 lb (78.9 kg)  02/13/17 178 lb (80.7 kg)    Physical Exam  Constitutional: No distress.  Chronically ill-appearing  Cardiovascular: Normal rate, regular rhythm and normal heart sounds.  Pulmonary/Chest: Effort normal and breath sounds normal.  Musculoskeletal: He exhibits no edema.  Neurological: He is alert.  Skin:  Skin is warm and dry. He is not diaphoretic.     Assessment/Plan: Please see individual problem list.  Essential hypertension Remains stable.  Asymptomatic.  Continue to monitor.  COPD, severe ([place]) Seems to be pretty stable.  About a month ago he has been starting to use 3-4 L.  He states he was previously just on 3 L.  He has felt well with this increase.  Discussed the need to see his pulmonologist for follow-up.  If he develops any acute changes he needs to be reevaluated.  Hyperlipidemia Lipid panel is been very well controlled.  Given TriCor is too expensive he can discontinue this and will plan on rechecking his cholesterol panel in the future.  I did advise the patient to not use Cordyceps or any other supplements as we do not know what is actually in them and they are not regulated.  No orders of the defined types were placed in this encounter.   No orders of the defined types were placed in this encounter.    Peter Rumps, [facility]

## 2017-04-18 NOTE — Patient Instructions (Signed)
Nice to see you. Please see your pulmonologist as scheduled. Please monitor your breathing. I would avoid taking supplements as there is no way to know exactly what is in them. We will try to get you set up with a wheelchair and as shower chair You can discontinue the TriCor.  We will plan on rechecking your cholesterol at a future visit.

## 2017-04-20 ENCOUNTER — Telehealth: Payer: Self-pay | Admitting: Family Medicine

## 2017-04-20 DIAGNOSIS — J449 Chronic obstructive pulmonary disease, unspecified: Secondary | ICD-10-CM

## 2017-04-20 NOTE — Assessment & Plan Note (Signed)
Lipid panel is been very well controlled.  Given TriCor is too expensive he can discontinue this and will plan on rechecking his cholesterol panel in the future.

## 2017-04-20 NOTE — Progress Notes (Signed)
Left message to return call, ok for pec to speak to patient and ask if he has issues bathing, feeding or toileting himself

## 2017-04-20 NOTE — Assessment & Plan Note (Signed)
Remains stable.  Asymptomatic.  Continue to monitor.

## 2017-04-20 NOTE — Telephone Encounter (Signed)
FYI

## 2017-04-20 NOTE — Telephone Encounter (Signed)
Patient returned call from below message left. He said "I don't have any trouble toileting or eating. I do have trouble getting in the shower, so I just bathe the best way I can. I would like a prescription for a shower chair and a wheelchair, if possible."    Juanda Chance, [place]  Certified Medical Assistant    Progress Notes  Signed  Encounter Date:  04/18/2017          Signed            Left message to return call, ok for pec to speak to patient and ask if he has issues bathing, feeding or toileting himself          Electronically signed by Juanda Chance, CMA at 04/20/2017 3:48 PM

## 2017-04-20 NOTE — Assessment & Plan Note (Signed)
Seems to be pretty stable.  About a month ago he has been starting to use 3-4 L.  He states he was previously just on 3 L.  He has felt well with this increase.  Discussed the need to see his pulmonologist for follow-up.  If he develops any acute changes he needs to be reevaluated.

## 2017-04-21 NOTE — Telephone Encounter (Signed)
fyi

## 2017-04-23 NOTE — Telephone Encounter (Signed)
Order signed and printed.  Please fax with shower chair prescription.  Thanks.

## 2017-04-23 NOTE — Addendum Note (Signed)
Addended by: Leone Haven on: 04/23/2017 09:42 AM   Modules accepted: Orders

## 2017-04-24 NOTE — Telephone Encounter (Signed)
Tried calling to ask patient where we should fax the orders, no vm. [place] for pec to speak to patient

## 2017-04-25 ENCOUNTER — Encounter: Payer: Self-pay | Admitting: Internal Medicine

## 2017-04-25 ENCOUNTER — Ambulatory Visit (INDEPENDENT_AMBULATORY_CARE_PROVIDER_SITE_OTHER): Payer: Medicare Other | Admitting: Internal Medicine

## 2017-04-25 ENCOUNTER — Ambulatory Visit: Payer: Self-pay | Admitting: Internal Medicine

## 2017-04-25 VITALS — BP 116/66 | HR 67 | Resp 16 | Ht 68.0 in | Wt 167.0 lb

## 2017-04-25 DIAGNOSIS — J9611 Chronic respiratory failure with hypoxia: Secondary | ICD-10-CM

## 2017-04-25 DIAGNOSIS — K219 Gastro-esophageal reflux disease without esophagitis: Secondary | ICD-10-CM

## 2017-04-25 DIAGNOSIS — J4489 Other specified chronic obstructive pulmonary disease: Secondary | ICD-10-CM

## 2017-04-25 DIAGNOSIS — I2583 Coronary atherosclerosis due to lipid rich plaque: Secondary | ICD-10-CM | POA: Diagnosis not present

## 2017-04-25 DIAGNOSIS — C3412 Malignant neoplasm of upper lobe, left bronchus or lung: Secondary | ICD-10-CM | POA: Diagnosis not present

## 2017-04-25 DIAGNOSIS — J449 Chronic obstructive pulmonary disease, unspecified: Secondary | ICD-10-CM

## 2017-04-25 DIAGNOSIS — J849 Interstitial pulmonary disease, unspecified: Secondary | ICD-10-CM | POA: Insufficient documentation

## 2017-04-25 DIAGNOSIS — I2510 Atherosclerotic heart disease of native coronary artery without angina pectoris: Secondary | ICD-10-CM | POA: Diagnosis not present

## 2017-04-25 MED ORDER — THEOPHYLLINE ER 400 MG PO TB24: 400.0000 mg | ORAL_TABLET | Freq: Every day | ORAL | 4 refills | Status: DC

## 2017-04-25 NOTE — Patient Instructions (Signed)

## 2017-04-25 NOTE — Progress Notes (Signed)
[facility] [place], [place] [address]  Pulmonary Sleep Medicine  Office Visit Note  Patient Name: Peter Johnston DOB: [birth year] MRN [id number]  Date of Service: 04/25/2017  Complaints/HPI: on oxygen doing well. He states he is not smoking. Recently seen by PCP doing fine. Still with SOB on exertion noted. Notes cough and little bit of sputum. Patient states that he has no hemoptysis. Denies having chest pain at this time. Currently he is on oxygen 3-4lpm. He has niot been taking theophylline because he is out of the medications  ROS  General: (-) fever, (-) chills, (-) night sweats, (-) weakness Skin: (-) rashes, (-) itching,. Eyes: (-) visual changes, (-) redness, (-) itching. Nose and Sinuses: (-) nasal stuffiness or itchiness, (-) postnasal drip, (-) nosebleeds, (-) sinus trouble. Mouth and Throat: (-) sore throat, (-) hoarseness. Neck: (-) swollen glands, (-) enlarged thyroid, (-) neck pain. Respiratory: + cough, (-) bloody sputum, + shortness of breath, + wheezing. Cardiovascular: +- ankle swelling, (-) chest pain. Lymphatic: (-) lymph node enlargement. Neurologic: (-) numbness, (-) tingling. Psychiatric: (-) anxiety, (-) depression   Current Medication: Outpatient Encounter Medications as of 04/25/2017  Medication Sig Note  . albuterol (PROVENTIL HFA;VENTOLIN HFA) 108 (90 Base) MCG/ACT inhaler Inhale 2 puffs into the lungs every 6 (six) hours as needed for wheezing or shortness of breath.   . allopurinol (ZYLOPRIM) 300 MG tablet Take 300 mg by mouth daily.   Marland Kitchen atenolol (TENORMIN) 25 MG tablet TAKE 1 TABLET DAILY   . brimonidine (ALPHAGAN) 0.2 % ophthalmic solution Place 1 drop into both eyes 2 (two) times daily.   . ENBREL 50 MG/ML injection 50 mg once a week.    . fenofibrate (TRICOR) 145 MG tablet Take 1 tablet (145 mg total) by mouth daily.   . Fluticasone Furoate-Vilanterol (BREO ELLIPTA) 100-25 MCG/INH AEPB Inhale 1 puff into the lungs  daily.   . furosemide (LASIX) 20 MG tablet Take 1 tablet by mouth as needed for edema.   . hydroxychloroquine (PLAQUENIL) 200 MG tablet Take 200 mg by mouth daily.   Marland Kitchen ipratropium-albuterol (DUONEB) 0.5-2.5 (3) MG/3ML SOLN Inhale 3 mLs into the lungs every 4 (four) hours as needed.   . latanoprost (XALATAN) 0.005 % ophthalmic solution Place 1 drop into both eyes at bedtime.   . pantoprazole (PROTONIX) 40 MG tablet Take 1 tablet (40 mg total) by mouth 2 (two) times daily before a meal.   . predniSONE (DELTASONE) 10 MG tablet Take 10 mg by mouth daily with breakfast.   . theophylline (UNIPHYL) 400 MG 24 hr tablet Take 400 mg by mouth daily.  12/05/2016: Patient out of medication  . TRAVATAN Z 0.004 % SOLN ophthalmic solution Place 1 drop into both eyes at bedtime.    . traZODone (DESYREL) 50 MG tablet Take 0.5-1 tablets (25-50 mg total) by mouth at bedtime as needed for sleep. 12/05/2016: Only taking as needed   No facility-administered encounter medications on file as of 04/25/2017.     Surgical History: Past Surgical History:  Procedure Laterality Date  . ABDOMINAL HERNIA REPAIR  1995   Dr. Rochel Brome  . HAMMER TOE SURGERY  2011, 2012   x 2- Dr. Elvina Mattes    Medical History: Past Medical History:  Diagnosis Date  . Arthritis   . Asthma   . COPD (chronic obstructive pulmonary disease) ([place])   . Hypertension   . IDA (iron deficiency anemia)   . Lung mass     Family  History: Family History  Problem Relation Age of Onset  . Stroke Father   . Heart disease Father   . Hypertension Father   . Heart disease Brother 4    Social History: Social History   Socioeconomic History  . Marital status: Widowed    Spouse name: Not on file  . Number of children: 2  . Years of education: Not on file  . Highest education level: Not on file  Social Needs  . Financial resource strain: Not on file  . Food insecurity - worry: Not on file  . Food insecurity - inability: Not on file  .  Transportation needs - medical: Not on file  . Transportation needs - non-medical: Not on file  Occupational History  . Occupation: Retired   Tobacco Use  . Smoking status: Former Smoker    Packs/day: 2.00    Years: 63.00    Pack years: 126.00    Types: Cigarettes    Last attempt to quit: 09/13/2015    Years since quitting: 1.6  . Smokeless tobacco: Never Used  . Tobacco comment: Stopped smoking cigarettes Sunday and uses the vapor e-cig to help with cravings  Substance and Sexual Activity  . Alcohol use: Yes    Alcohol/week: 0.0 oz    Comment: 2-3 beers at night  . Drug use: No  . Sexual activity: No  Other Topics Concern  . Not on file  Social History Narrative   Lives in [place] with son.      2 children, 2 grandchildren             Vital Signs: Blood pressure 116/66, pulse 67, resp. rate 16, height '5\' 8"'$  (1.727 m), weight 167 lb (75.8 kg), SpO2 92 %.  Examination: General Appearance: The patient is well-developed, well-nourished, and in no distress. Skin: Gross inspection of skin unremarkable. Head: normocephalic, no gross deformities. Eyes: no gross deformities noted. ENT: ears appear grossly normal no exudates. Neck: Supple. No thyromegaly. No LAD. Respiratory: diminished and no rhonchi noted. Cardiovascular: Normal S1 and S2 without murmur or rub. Extremities: No cyanosis. pulses are equal. Neurologic: Alert and oriented. No involuntary movements.  LABS: Recent Results (from the past 2160 hour(s))  Iron and TIBC     Status: Abnormal   Collection Time: 02/13/17 11:17 AM  Result Value Ref Range   Iron 63 45 - 182 ug/dL   TIBC 386 250 - 450 ug/dL   Saturation Ratios 16 (L) 17.9 - 39.5 %   UIBC 323 ug/dL  Ferritin     Status: None   Collection Time: 02/13/17 11:17 AM  Result Value Ref Range   Ferritin 101 24 - 336 ng/mL  Comprehensive metabolic panel     Status: Abnormal   Collection Time: 02/13/17 11:17 AM  Result Value Ref Range   Sodium 139 135 -  145 mmol/L   Potassium 3.9 3.5 - 5.1 mmol/L   Chloride 99 (L) 101 - 111 mmol/L   CO2 34 (H) 22 - 32 mmol/L   Glucose, Bld 117 (H) 65 - 99 mg/dL   BUN 18 6 - 20 mg/dL   Creatinine, Ser 0.87 0.61 - 1.24 mg/dL   Calcium 9.0 8.9 - 10.3 mg/dL   Total Protein 7.4 6.5 - 8.1 g/dL   Albumin 3.8 3.5 - 5.0 g/dL   AST 23 15 - 41 U/L   ALT 15 (L) 17 - 63 U/L   Alkaline Phosphatase 42 38 - 126 U/L   Total Bilirubin 0.3 0.3 -  1.2 mg/dL   GFR calc non Af Amer >60 >60 mL/min   GFR calc Af Amer >60 >60 mL/min    Comment: (NOTE) The eGFR has been calculated using the CKD EPI equation. This calculation has not been validated in all clinical situations. eGFR's persistently <60 mL/min signify possible Chronic Kidney Disease.    Anion gap 6 5 - 15  CBC with Differential     Status: Abnormal   Collection Time: 02/13/17 11:17 AM  Result Value Ref Range   WBC 10.9 (H) 3.8 - 10.6 K/uL   RBC 4.56 4.40 - 5.90 MIL/uL   Hemoglobin 12.0 (L) 13.0 - 18.0 g/dL   HCT 38.6 (L) 40.0 - 52.0 %   MCV 84.6 80.0 - 100.0 fL   MCH 26.3 26.0 - 34.0 pg   MCHC 31.1 (L) 32.0 - 36.0 g/dL   RDW 22.0 (H) 11.5 - 14.5 %   Platelets 200 150 - 440 K/uL   Neutrophils Relative % 75 %   Neutro Abs 8.3 (H) 1.4 - 6.5 K/uL   Lymphocytes Relative 13 %   Lymphs Abs 1.4 1.0 - 3.6 K/uL   Monocytes Relative 9 %   Monocytes Absolute 1.0 0.2 - 1.0 K/uL   Eosinophils Relative 2 %   Eosinophils Absolute 0.2 0 - 0.7 K/uL   Basophils Relative 1 %   Basophils Absolute 0.1 0 - 0.1 K/uL  CBC with Differential/Platelet     Status: Abnormal   Collection Time: 03/10/17  2:31 PM  Result Value Ref Range   WBC 10.5 3.8 - 10.6 K/uL   RBC 4.69 4.40 - 5.90 MIL/uL   Hemoglobin 12.8 (L) 13.0 - 18.0 g/dL   HCT 40.9 40.0 - 52.0 %   MCV 87.1 80.0 - 100.0 fL   MCH 27.3 26.0 - 34.0 pg   MCHC 31.3 (L) 32.0 - 36.0 g/dL   RDW 20.5 (H) 11.5 - 14.5 %   Platelets 210 150 - 440 K/uL   Neutrophils Relative % 88 %   Neutro Abs 9.1 (H) 1.4 - 6.5 K/uL    Lymphocytes Relative 8 %   Lymphs Abs 0.8 (L) 1.0 - 3.6 K/uL   Monocytes Relative 4 %   Monocytes Absolute 0.4 0.2 - 1.0 K/uL   Eosinophils Relative 1 %   Eosinophils Absolute 0.1 0 - 0.7 K/uL   Basophils Relative 1 %   Basophils Absolute 0.1 0 - 0.1 K/uL    Radiology: Ct Chest Wo Contrast  Result Date: 03/08/2017 CLINICAL DATA:  Followup lung cancer. EXAM: CT CHEST WITHOUT CONTRAST TECHNIQUE: Multidetector CT imaging of the chest was performed following the standard protocol without IV contrast. COMPARISON:  09/06/2016 FINDINGS: Cardiovascular: The heart size appears within normal limits. Aortic atherosclerosis noted. Calcification within the LAD and left circumflex and RCA coronary arteries noted. Mediastinum/Nodes: The trachea appears patent and is midline. Normal appearance of the esophagus. No enlarged mediastinal or hilar adenopathy. No axillary or supraclavicular adenopathy. Lungs/Pleura: Advanced changes of bullous emphysema. Pulmonary nodule in the left upper lobe measures 1.2 cm and is unchanged from previous exam, image 66 of series 3. Stable nodule in the left upper lobe measuring 4 mm, image 58 of series 3. Upper Abdomen: Gallstones. Aortic atherosclerosis. No acute abnormality. Musculoskeletal: Degenerative disc disease noted. IMPRESSION: 1. No acute cardiopulmonary abnormalities. 2. Stable nodular densities within the left upper lobe 3. Advanced changes of bullous emphysema (ICD10-J43.9). 4. Aortic Atherosclerosis (ICD10-I70.0). Three vessel coronary artery calcifications noted. 5. Gallstones. Electronically Signed  By: Kerby Moors M.D.   On: 03/08/2017 15:25    No results found.  No results found.    Assessment and Plan: Patient Active Problem List   Diagnosis Date Noted  . Interstitial lung disease ([place]) 04/25/2017  . Iron deficiency anemia due to chronic blood loss 10/17/2016  . COPD exacerbation ([place]) 10/09/2016  . Back pain 10/09/2016  . Lower extremity edema  10/09/2016  . Hyperlipidemia 03/03/2016  . Primary cancer of left upper lobe of lung ([place]) 11/09/2015  . Insomnia 10/16/2015  . Rheumatoid arthritis ([place]) 08/17/2015  . OP (osteoporosis) 08/17/2015  . Gout 08/17/2015  . Glaucoma 08/17/2015  . Coronary artery disease 07/22/2014  . Lumbar canal stenosis 08/28/2013  . GERD (gastroesophageal reflux disease) 06/29/2011  . Iron deficiency anemia 06/29/2011  . Essential hypertension 06/07/2011  . Benign prostatic hypertrophy with urinary frequency 06/07/2011  . COPD, severe ([place]) 06/07/2011    1. COPD will give script for theo today. contonuie with oxygen therapy sample of trelegy 2. Chronic respiratory failure with hypoxia on oxygen therapy which will be continued 3. CAD stable no active pain 4. LUL Lung cancer seen by oncology will continje 5. GERD stable on meds  General Counseling: I have discussed the findings of the evaluation and examination with Peter Johnston.  I have also discussed any further diagnostic evaluation thatmay be needed or ordered today. Peter Johnston verbalizes understanding of the findings of todays visit. We also reviewed his medications today and discussed drug interactions and side effects including but not limited excessive drowsiness and altered mental states. We also discussed that there is always a risk not just to him but also people around him. he has been encouraged to call the office with any questions or concerns that should arise related to todays visit.    Time spent: 35mn  I have personally obtained a history, examined the patient, evaluated laboratory and imaging results, formulated the assessment and plan and placed orders.    SAllyne Gee MD FSt Lucys Outpatient Surgery Center IncPulmonary and Critical Care Sleep medicine

## 2017-04-27 NOTE — Telephone Encounter (Signed)
Patient called and said Peter Johnston [phone] needs an order from the doctor for a Transport Chair and a Civil engineer, contracting.  It can be faxed to [phone].

## 2017-05-01 NOTE — Telephone Encounter (Signed)
faxed

## 2017-05-14 DIAGNOSIS — J449 Chronic obstructive pulmonary disease, unspecified: Secondary | ICD-10-CM | POA: Diagnosis not present

## 2017-05-14 DIAGNOSIS — R0602 Shortness of breath: Secondary | ICD-10-CM | POA: Diagnosis not present

## 2017-05-14 DIAGNOSIS — J9610 Chronic respiratory failure, unspecified whether with hypoxia or hypercapnia: Secondary | ICD-10-CM | POA: Diagnosis not present

## 2017-05-18 ENCOUNTER — Telehealth: Payer: Self-pay | Admitting: Radiology

## 2017-05-18 DIAGNOSIS — E781 Pure hyperglyceridemia: Secondary | ICD-10-CM

## 2017-05-18 NOTE — Telephone Encounter (Signed)
I believe this is for a lipid panel.  I do not think this is for the labs from his hematologist.  I placed an order for the lipid panel.

## 2017-05-18 NOTE — Addendum Note (Signed)
Addended by: Leone Haven on: 05/18/2017 06:38 PM   Modules accepted: Orders

## 2017-05-18 NOTE — Telephone Encounter (Signed)
Pt coming in for labs tomorrow, labs are in for another provider. Do you need labs on pt? IF so, please place future orders. Thank you.

## 2017-05-19 ENCOUNTER — Other Ambulatory Visit (INDEPENDENT_AMBULATORY_CARE_PROVIDER_SITE_OTHER): Payer: Medicare Other

## 2017-05-19 DIAGNOSIS — M1000 Idiopathic gout, unspecified site: Secondary | ICD-10-CM

## 2017-05-19 DIAGNOSIS — E781 Pure hyperglyceridemia: Secondary | ICD-10-CM | POA: Diagnosis not present

## 2017-05-19 LAB — LIPID PANEL
CHOLESTEROL: 150 mg/dL (ref 0–200)
HDL: 85.1 mg/dL (ref >39.00)
LDL CALC: 48 mg/dL (ref 0–99)
NonHDL: 64.99
TRIGLYCERIDES: 85 mg/dL (ref 0.0–149.0)
Total CHOL/HDL Ratio: 2
VLDL: 17 mg/dL (ref 0.0–40.0)

## 2017-05-19 LAB — URIC ACID: Uric Acid, Serum: 4.8 mg/dL (ref 4.0–7.8)

## 2017-05-19 NOTE — Addendum Note (Signed)
Addended by: Leeanne Rio on: 05/19/2017 01:46 PM   Modules accepted: Orders

## 2017-05-29 ENCOUNTER — Other Ambulatory Visit: Payer: Self-pay

## 2017-05-29 DIAGNOSIS — J42 Unspecified chronic bronchitis: Secondary | ICD-10-CM | POA: Diagnosis not present

## 2017-05-29 DIAGNOSIS — J449 Chronic obstructive pulmonary disease, unspecified: Secondary | ICD-10-CM | POA: Diagnosis not present

## 2017-05-29 MED ORDER — UMECLIDINIUM-VILANTEROL 62.5-25 MCG/INH IN AEPB: INHALATION_SPRAY | RESPIRATORY_TRACT | 1 refills | Status: DC

## 2017-05-30 ENCOUNTER — Telehealth: Payer: Self-pay | Admitting: Family Medicine

## 2017-05-30 NOTE — Telephone Encounter (Signed)
Please advise  Labs mailed

## 2017-05-30 NOTE — Telephone Encounter (Signed)
Copied from [place] [phone]. Topic: General - Other >> May 30, 2017  1:55 PM Yvette Rack wrote: Reason for CRM: patient calling stating that he use to be on Ranitidine 150 mg Dr Ronette Deter had put him on this medicine its not in his medicine list he also would like a refill on that patient also would like a copy of his labs mailed to him

## 2017-05-31 MED ORDER — RANITIDINE HCL 150 MG PO TABS: 150.0000 mg | ORAL_TABLET | Freq: Two times a day (BID) | ORAL | 1 refills | Status: DC

## 2017-05-31 NOTE — Telephone Encounter (Signed)
Zantac sent to his pharmacy.  Please see if he is taking anything else for reflux.  Please see if he has had any additional issues with his reflux.  Thanks.

## 2017-06-01 ENCOUNTER — Telehealth: Payer: Self-pay | Admitting: *Deleted

## 2017-06-01 MED ORDER — RANITIDINE HCL 150 MG PO TABS: 150.0000 mg | ORAL_TABLET | Freq: Two times a day (BID) | ORAL | 1 refills | Status: DC

## 2017-06-01 NOTE — Telephone Encounter (Signed)
FYI    Copied from [place]. Topic: Quick Communication - See Telephone Encounter >> Jun 01, 2017 11:43 AM Juanda Chance, CMA wrote: CRM for notification. See Telephone encounter for:  Left message to return call, ok for pec to speak to patient, also please see result note 06/01/17. >> Jun 01, 2017 12:10 PM Brayton El, Shana A wrote: Pt called in and said that he was taking pantoprazole 40mg .  He stated that it is not working.  He will take the ranitidine (ZANTAC) 150 MG tablet [[id number]] but he would like it sent to [facility] ( his mail order).  He get them free though mail order.  He is cancelling the one at [facility]

## 2017-06-01 NOTE — Telephone Encounter (Signed)
Left message to return call, ok for pec to speak to patient, also please see result note

## 2017-06-01 NOTE — Telephone Encounter (Signed)
Sent to pharmacy 

## 2017-06-01 NOTE — Telephone Encounter (Signed)
Copied from [place] [id number]. Topic: Quick Communication - See Telephone Encounter >> Jun 01, 2017 11:43 AM Juanda Chance, CMA wrote: CRM for notification. See Telephone encounter for:  Left message to return call, ok for pec to speak to patient, also please see result note 06/01/17. >> Jun 01, 2017 12:10 PM Brayton El, Shana A wrote: Pt called in and said that he was taking pantoprazole 40mg .  He stated that it is not working.  He will take the ranitidine (ZANTAC) 150 MG tablet [[id number]] but he would like it sent to [facility] ( his mail order).  He get them free though mail order.  He is cancelling the one at [facility]

## 2017-06-09 ENCOUNTER — Other Ambulatory Visit: Payer: Self-pay | Admitting: *Deleted

## 2017-06-09 MED ORDER — PANTOPRAZOLE SODIUM 40 MG PO TBEC: 40.0000 mg | DELAYED_RELEASE_TABLET | Freq: Two times a day (BID) | ORAL | 0 refills | Status: DC

## 2017-06-11 DIAGNOSIS — J9610 Chronic respiratory failure, unspecified whether with hypoxia or hypercapnia: Secondary | ICD-10-CM | POA: Diagnosis not present

## 2017-06-11 DIAGNOSIS — R0602 Shortness of breath: Secondary | ICD-10-CM | POA: Diagnosis not present

## 2017-06-11 DIAGNOSIS — J449 Chronic obstructive pulmonary disease, unspecified: Secondary | ICD-10-CM | POA: Diagnosis not present

## 2017-06-22 ENCOUNTER — Telehealth: Payer: Self-pay

## 2017-06-22 NOTE — Telephone Encounter (Signed)
Copied from [place] [id number]. Topic: General - Other >> Jun 22, 2017  4:21 PM Carolyn Stare wrote:  Pt was notified of his lab results

## 2017-06-23 ENCOUNTER — Inpatient Hospital Stay: Payer: Medicare Other

## 2017-06-23 ENCOUNTER — Inpatient Hospital Stay (HOSPITAL_BASED_OUTPATIENT_CLINIC_OR_DEPARTMENT_OTHER): Payer: Medicare Other | Admitting: Internal Medicine

## 2017-06-23 ENCOUNTER — Inpatient Hospital Stay: Payer: Medicare Other | Attending: Internal Medicine

## 2017-06-23 ENCOUNTER — Encounter: Payer: Self-pay | Admitting: Internal Medicine

## 2017-06-23 VITALS — BP 108/64 | HR 85 | Resp 16 | Wt 164.0 lb

## 2017-06-23 DIAGNOSIS — R5383 Other fatigue: Secondary | ICD-10-CM

## 2017-06-23 DIAGNOSIS — Z79899 Other long term (current) drug therapy: Secondary | ICD-10-CM | POA: Diagnosis not present

## 2017-06-23 DIAGNOSIS — R0602 Shortness of breath: Secondary | ICD-10-CM | POA: Insufficient documentation

## 2017-06-23 DIAGNOSIS — I10 Essential (primary) hypertension: Secondary | ICD-10-CM | POA: Diagnosis not present

## 2017-06-23 DIAGNOSIS — D500 Iron deficiency anemia secondary to blood loss (chronic): Secondary | ICD-10-CM

## 2017-06-23 DIAGNOSIS — J449 Chronic obstructive pulmonary disease, unspecified: Secondary | ICD-10-CM | POA: Diagnosis not present

## 2017-06-23 DIAGNOSIS — C3412 Malignant neoplasm of upper lobe, left bronchus or lung: Secondary | ICD-10-CM | POA: Insufficient documentation

## 2017-06-23 DIAGNOSIS — M129 Arthropathy, unspecified: Secondary | ICD-10-CM

## 2017-06-23 DIAGNOSIS — Z87891 Personal history of nicotine dependence: Secondary | ICD-10-CM | POA: Diagnosis not present

## 2017-06-23 LAB — COMPREHENSIVE METABOLIC PANEL
ALBUMIN: 3.5 g/dL (ref 3.5–5.0)
ALK PHOS: 58 U/L (ref 38–126)
ALT: 13 U/L — AB (ref 17–63)
AST: 23 U/L (ref 15–41)
Anion gap: 9 (ref 5–15)
BUN: 12 mg/dL (ref 6–20)
CALCIUM: 9.2 mg/dL (ref 8.9–10.3)
CO2: 28 mmol/L (ref 22–32)
CREATININE: 0.8 mg/dL (ref 0.61–1.24)
Chloride: 101 mmol/L (ref 101–111)
GFR calc Af Amer: >60 mL/min (ref >60)
GFR calc non Af Amer: >60 mL/min (ref >60)
GLUCOSE: 135 mg/dL — AB (ref 65–99)
POTASSIUM: 4.2 mmol/L (ref 3.5–5.1)
SODIUM: 138 mmol/L (ref 135–145)
TOTAL PROTEIN: 7.6 g/dL (ref 6.5–8.1)
Total Bilirubin: 0.6 mg/dL (ref 0.3–1.2)

## 2017-06-23 LAB — CBC WITH DIFFERENTIAL/PLATELET
BASOS ABS: 0.1 10*3/uL (ref 0–0.1)
Basophils Relative: 1 %
EOS PCT: 1 %
Eosinophils Absolute: 0.1 10*3/uL (ref 0–0.7)
HCT: 39.5 % — ABNORMAL LOW (ref 40.0–52.0)
Hemoglobin: 13.1 g/dL (ref 13.0–18.0)
LYMPHS PCT: 7 %
Lymphs Abs: 0.9 10*3/uL — ABNORMAL LOW (ref 1.0–3.6)
MCH: 30.1 pg (ref 26.0–34.0)
MCHC: 33.1 g/dL (ref 32.0–36.0)
MCV: 90.8 fL (ref 80.0–100.0)
MONO ABS: 0.5 10*3/uL (ref 0.2–1.0)
Monocytes Relative: 4 %
Neutro Abs: 11.2 10*3/uL — ABNORMAL HIGH (ref 1.4–6.5)
Neutrophils Relative %: 87 %
PLATELETS: 233 10*3/uL (ref 150–440)
RBC: 4.35 MIL/uL — ABNORMAL LOW (ref 4.40–5.90)
RDW: 15.7 % — AB (ref 11.5–14.5)
WBC: 12.8 10*3/uL — ABNORMAL HIGH (ref 3.8–10.6)

## 2017-06-23 LAB — IRON AND TIBC
Iron: 44 ug/dL — ABNORMAL LOW (ref 45–182)
Saturation Ratios: 16 % — ABNORMAL LOW (ref 17.9–39.5)
TIBC: 273 ug/dL (ref 250–450)
UIBC: 229 ug/dL

## 2017-06-23 LAB — FERRITIN: Ferritin: 99 ng/mL (ref 24–336)

## 2017-06-23 MED ORDER — IRON SUCROSE 20 MG/ML IV SOLN
200.0000 mg | Freq: Once | INTRAVENOUS | Status: AC
  Administered 2017-06-23: 200 mg via INTRAVENOUS
  Filled 2017-06-23: qty 10

## 2017-06-23 MED ORDER — SODIUM CHLORIDE 0.9 % IV SOLN
Freq: Once | INTRAVENOUS | Status: AC
  Administered 2017-06-23: 16:00:00 via INTRAVENOUS
  Filled 2017-06-23: qty 1000

## 2017-06-23 NOTE — Assessment & Plan Note (Signed)
#   IDA-July 2018 [poor candidate for colonoscopy; Dr. Gustavo Lah   improved with IV iron.  Today hemoglobin is 13. Iron sat- 16%; Proceed with IV iron infusion today.   # # LEFT UPPER LOBE LUNG CA- STAGE I- [malignancy-based on imaging; poor candidate for biopsy]; s/p SBRT June 2017.  #CT scan March 08, 2017-stable 11 mm left upper lobe lung nodule.  A separate 3 mm nodule noted. Recommend follow up CT scan in 3 months from now.  # COPD-severe. Symptomatic but stable. 3L oxygen.  Defer to pulmonary for further recommendations.  # Follow-up in 3 months labs and studies/possible Venofer; CT scan prior.

## 2017-06-23 NOTE — Progress Notes (Signed)
[facility] NOTE  Patient Care Team: Leone Haven, MD as PCP - General (Family Medicine) Allyne Gee, MD (Pulmonary Disease) Lollie Sails, MD as Consulting Physician (Gastroenterology)  CHIEF COMPLAINTS/PURPOSE OF CONSULTATION:   Oncology History   # April 2016-LUL LUNG CA [clinical; no Bx sec to high risk] LUL LUNG NODULE [80mm]; MAY 2017- 67mm; PET avid- no distant mets s/p SBRT [end of June 2017];   # NOV 22nd CT scan- Improved LUL nodule  #Iron deficiency anemia-September 2018 [poor candidate for EGD colonoscopy; Dr.Skulskie] ? Gastritis vs AV malformation  # COPD- home O2 [Dr.Saadat Khan]     Primary cancer of left upper lobe of lung ([place])   11/09/2015 Initial Diagnosis    Primary cancer of left upper lobe of lung ([place])      HISTORY OF PRESENTING ILLNESS:  Peter Johnston 77 y.o.  male stage I LUL nodule s/p SB RT ended June 2017, advanced COPD on 3L O2, who continues to smoke, f/u for iron deficiency anemia.  Is currently status post IV iron.  Patient has noted significant improvement of his fatigue since IV iron transfusion. Patient continues to have chronic shortness of breath on home O2.  He continues to have chronic fatigue. He denies taking any black colored stools.  Appetite is fair. He denies abdominal pain. No nausea/ vomiting.  No weight loss.  ROS: A complete 10 point review of system is done which is negative except mentioned above in history of present illness  MEDICAL HISTORY:  Past Medical History:  Diagnosis Date  . Arthritis   . Asthma   . COPD (chronic obstructive pulmonary disease) ([place])   . Hypertension   . IDA (iron deficiency anemia)   . Lung mass     SURGICAL HISTORY: Past Surgical History:  Procedure Laterality Date  . ABDOMINAL HERNIA REPAIR  1995   Dr. Rochel Brome  . [place]  2011, 2012   x 2- Dr. Elvina Mattes    SOCIAL HISTORY: retired with [employer]; [place]; with girl friend; and son.   Social History   Socioeconomic History  . Marital status: Widowed    Spouse name: Not on file  . Number of children: 2  . Years of education: Not on file  . Highest education level: Not on file  Occupational History  . Occupation: Retired   Scientific laboratory technician  . Financial resource strain: Not on file  . Food insecurity:    Worry: Not on file    Inability: Not on file  . Transportation needs:    Medical: Not on file    Non-medical: Not on file  Tobacco Use  . Smoking status: Former Smoker    Packs/day: 2.00    Years: 63.00    Pack years: 126.00    Types: Cigarettes    Last attempt to quit: 09/13/2015    Years since quitting: 1.7  . Smokeless tobacco: Never Used  . Tobacco comment: Stopped smoking cigarettes Sunday and uses the vapor e-cig to help with cravings  Substance and Sexual Activity  . Alcohol use: Yes    Alcohol/week: 0.0 oz    Comment: 2-3 beers at night  . Drug use: No  . Sexual activity: Never  Lifestyle  . Physical activity:    Days per week: Not on file    Minutes per session: Not on file  . Stress: Not on file  Relationships  . Social connections:    Talks on phone:  Not on file    Gets together: Not on file    Attends religious service: Not on file    Active member of club or organization: Not on file    Attends meetings of clubs or organizations: Not on file    Relationship status: Not on file  . Intimate partner violence:    Fear of current or ex partner: Not on file    Emotionally abused: Not on file    Physically abused: Not on file    Forced sexual activity: Not on file  Other Topics Concern  . Not on file  Social History Narrative   Lives in [place] with son.      2 children, 2 grandchildren             FAMILY HISTORY: Family History  Problem Relation Age of Onset  . Stroke Father   . Heart disease Father   . Hypertension Father   . Heart disease Brother 8    ALLERGIES:  is allergic to tamsulosin; gold-containing drug  products; hydrochlorothiazide; and sulfa antibiotics.  MEDICATIONS:  Current Outpatient Medications  Medication Sig Dispense Refill  . albuterol (PROVENTIL HFA;VENTOLIN HFA) 108 (90 Base) MCG/ACT inhaler Inhale 2 puffs into the lungs every 6 (six) hours as needed for wheezing or shortness of breath. 1 Inhaler 2  . allopurinol (ZYLOPRIM) 300 MG tablet Take 300 mg by mouth daily.    Marland Kitchen atenolol (TENORMIN) 25 MG tablet TAKE 1 TABLET DAILY 90 tablet 3  . brimonidine (ALPHAGAN) 0.2 % ophthalmic solution Place 1 drop into both eyes 2 (two) times daily.    . ENBREL 50 MG/ML injection 50 mg once a week.     . fenofibrate (TRICOR) 145 MG tablet Take 1 tablet (145 mg total) by mouth daily. 90 tablet 3  . Fluticasone Furoate-Vilanterol (BREO ELLIPTA) 100-25 MCG/INH AEPB Inhale 1 puff into the lungs daily. 60 each 6  . furosemide (LASIX) 20 MG tablet Take 1 tablet by mouth as needed for edema.    . hydroxychloroquine (PLAQUENIL) 200 MG tablet Take 200 mg by mouth daily.    Marland Kitchen ipratropium-albuterol (DUONEB) 0.5-2.5 (3) MG/3ML SOLN Inhale 3 mLs into the lungs every 4 (four) hours as needed. 90 mL 3  . latanoprost (XALATAN) 0.005 % ophthalmic solution Place 1 drop into both eyes at bedtime.    . pantoprazole (PROTONIX) 40 MG tablet Take 1 tablet (40 mg total) by mouth 2 (two) times daily before a meal. 180 tablet 0  . predniSONE (DELTASONE) 10 MG tablet Take 10 mg by mouth daily with breakfast.    . ranitidine (ZANTAC) 150 MG tablet Take 1 tablet (150 mg total) by mouth 2 (two) times daily. 180 tablet 1  . theophylline (UNIPHYL) 400 MG 24 hr tablet Take 1 tablet (400 mg total) by mouth daily. 30 tablet 4  . TRAVATAN Z 0.004 % SOLN ophthalmic solution Place 1 drop into both eyes at bedtime.     . traZODone (DESYREL) 50 MG tablet Take 0.5-1 tablets (25-50 mg total) by mouth at bedtime as needed for sleep. 30 tablet 3  . umeclidinium-vilanterol (ANORO ELLIPTA) 62.5-25 MCG/INH AEPB Inhale 1 puff daily 3 each 1    No current facility-administered medications for this visit.     PHYSICAL EXAMINATION: ECOG PERFORMANCE STATUS: 2 - Symptomatic, <50% confined to bed  Vitals:   06/23/17 1447  BP: 108/64  Pulse: 85  Resp: 16  SpO2: 92%   Filed Weights   06/23/17 1447  Weight:  164 lb (74.4 kg)    GENERAL: Well-nourished well-developed; Alert, on 3L oxygen via nasal cannula. He is accompanied by his family.  He is sitting in wheel chair.   EYES: Pallor has improved compared to previous visits OROPHARYNX: no thrush or ulceration.  NECK: supple, no masses felt LYMPH:  no palpable lymphadenopathy in the cervical nodes LUNGS: Bilateral decreased air entry; positive for bilateral wheeze/coarse breath sounds. HEART/CVS: regular rate & rhythm and no murmurs; No lower extremity edema ABDOMEN: abdomen soft, non-tender and normal bowel sounds. Obese. Musculoskeletal:no cyanosis of digits and no clubbing; barrel chested. PSYCH: alert & oriented x 3 with fluent speech NEURO: no focal motor/sensory deficits SKIN:  no rashes or significant lesions  LABORATORY DATA:  I have reviewed the data as listed Lab Results  Component Value Date   WBC 12.8 (H) 06/23/2017   HGB 13.1 06/23/2017   HCT 39.5 (L) 06/23/2017   MCV 90.8 06/23/2017   PLT 233 06/23/2017   Recent Labs    12/05/16 1335 02/13/17 1117 06/23/17 1420  NA 138 139 138  K 4.5 3.9 4.2  CL 101 99* 101  CO2 30 34* 28  GLUCOSE 127* 117* 135*  BUN 23* 18 12  CREATININE 1.27* 0.87 0.80  CALCIUM 8.9 9.0 9.2  GFRNONAA 53* >60 >60  GFRAA >60 >60 >60  PROT 6.9 7.4 7.6  ALBUMIN 3.6 3.8 3.5  AST 21 23 23   ALT 17 15* 13*  ALKPHOS 36* 42 58  BILITOT 0.5 0.3 0.6    RADIOGRAPHIC STUDIES: I have personally reviewed the radiological images as listed and agreed with the findings in the report. No results found.  ASSESSMENT & PLAN:   Iron deficiency anemia due to chronic blood loss # IDA-July 2018 [poor candidate for colonoscopy; Dr.  Gustavo Lah   improved with IV iron.  Today hemoglobin is 13. Iron sat- 16%; Proceed with IV iron infusion today.   # # LEFT UPPER LOBE LUNG CA- STAGE I- [malignancy-based on imaging; poor candidate for biopsy]; s/p SBRT June 2017.  #CT scan March 08, 2017-stable 11 mm left upper lobe lung nodule.  A separate 3 mm nodule noted. Recommend follow up CT scan in 3 months from now.  # COPD-severe. Symptomatic but stable. 3L oxygen.  Defer to pulmonary for further recommendations.  # Follow-up in 3 months labs and studies/possible Venofer; CT scan prior.     Cammie Sickle, MD 06/23/2017 4:47 PM

## 2017-06-28 DIAGNOSIS — H401112 Primary open-angle glaucoma, right eye, moderate stage: Secondary | ICD-10-CM | POA: Diagnosis not present

## 2017-07-12 DIAGNOSIS — J449 Chronic obstructive pulmonary disease, unspecified: Secondary | ICD-10-CM | POA: Diagnosis not present

## 2017-07-12 DIAGNOSIS — R0602 Shortness of breath: Secondary | ICD-10-CM | POA: Diagnosis not present

## 2017-07-12 DIAGNOSIS — J9610 Chronic respiratory failure, unspecified whether with hypoxia or hypercapnia: Secondary | ICD-10-CM | POA: Diagnosis not present

## 2017-07-17 ENCOUNTER — Ambulatory Visit: Payer: Medicare Other | Admitting: Family Medicine

## 2017-07-17 DIAGNOSIS — M1A00X0 Idiopathic chronic gout, unspecified site, without tophus (tophi): Secondary | ICD-10-CM | POA: Diagnosis not present

## 2017-07-17 DIAGNOSIS — C3412 Malignant neoplasm of upper lobe, left bronchus or lung: Secondary | ICD-10-CM | POA: Diagnosis not present

## 2017-07-17 DIAGNOSIS — M0579 Rheumatoid arthritis with rheumatoid factor of multiple sites without organ or systems involvement: Secondary | ICD-10-CM | POA: Diagnosis not present

## 2017-07-17 DIAGNOSIS — D508 Other iron deficiency anemias: Secondary | ICD-10-CM | POA: Diagnosis not present

## 2017-07-18 ENCOUNTER — Ambulatory Visit: Payer: Self-pay | Admitting: Internal Medicine

## 2017-07-26 ENCOUNTER — Other Ambulatory Visit: Payer: Self-pay

## 2017-07-28 ENCOUNTER — Other Ambulatory Visit: Payer: Self-pay | Admitting: Internal Medicine

## 2017-08-01 ENCOUNTER — Encounter: Payer: Self-pay | Admitting: Internal Medicine

## 2017-08-01 ENCOUNTER — Ambulatory Visit: Payer: Medicare Other | Admitting: Internal Medicine

## 2017-08-01 VITALS — BP 110/64 | HR 80 | Resp 16 | Ht 67.0 in | Wt 170.0 lb

## 2017-08-01 DIAGNOSIS — K219 Gastro-esophageal reflux disease without esophagitis: Secondary | ICD-10-CM

## 2017-08-01 DIAGNOSIS — J449 Chronic obstructive pulmonary disease, unspecified: Secondary | ICD-10-CM | POA: Diagnosis not present

## 2017-08-01 DIAGNOSIS — C3412 Malignant neoplasm of upper lobe, left bronchus or lung: Secondary | ICD-10-CM

## 2017-08-01 DIAGNOSIS — M069 Rheumatoid arthritis, unspecified: Secondary | ICD-10-CM

## 2017-08-01 NOTE — Patient Instructions (Signed)

## 2017-08-01 NOTE — Progress Notes (Signed)
[facility] [place], [place] [address]  Pulmonary Sleep Medicine   Office Visit Note  Patient Name: Peter Johnston DOB: 08-08-1940 MRN [id number]  Date of Service: 08/01/2017  Complaints/HPI:  Patient is here for follow-up he is doing fairly well.  He has had no admissions to the hospital.  Following with his oncologist for his lung cancer.  As far as his COPD is concerned it is under control he is had any admissions to the hospital.  Patient denies having any hemoptysis right now is at low if cough not much more than his usual self.  He is having some shortness breath at his baseline.  He continues to use vaping unfortunately.  ROS  General: (-) fever, (-) chills, (-) night sweats, (-) weakness Skin: (-) rashes, (-) itching,. Eyes: (-) visual changes, (-) redness, (-) itching. Nose and Sinuses: (-) nasal stuffiness or itchiness, (-) postnasal drip, (-) nosebleeds, (-) sinus trouble. Mouth and Throat: (-) sore throat, (-) hoarseness. Neck: (-) swollen glands, (-) enlarged thyroid, (-) neck pain. Respiratory: - cough, (-) bloody sputum, + shortness of breath, - wheezing. Cardiovascular: - ankle swelling, (-) chest pain. Lymphatic: (-) lymph node enlargement. Neurologic: (-) numbness, (-) tingling. Psychiatric: (-) anxiety, (-) depression   Current Medication: Outpatient Encounter Medications as of 08/01/2017  Medication Sig Note  . albuterol (PROVENTIL HFA;VENTOLIN HFA) 108 (90 Base) MCG/ACT inhaler Inhale 2 puffs into the lungs every 6 (six) hours as needed for wheezing or shortness of breath.   . allopurinol (ZYLOPRIM) 300 MG tablet Take 300 mg by mouth daily.   Marland Kitchen atenolol (TENORMIN) 25 MG tablet TAKE 1 TABLET DAILY   . brimonidine (ALPHAGAN) 0.2 % ophthalmic solution Place 1 drop into both eyes 2 (two) times daily.   . ENBREL 50 MG/ML injection 50 mg once a week.    . fenofibrate (TRICOR) 145 MG tablet Take 1 tablet (145 mg total) by mouth daily.   .  Fluticasone Furoate-Vilanterol (BREO ELLIPTA) 100-25 MCG/INH AEPB Inhale 1 puff into the lungs daily.   . furosemide (LASIX) 20 MG tablet Take 1 tablet by mouth as needed for edema.   . hydroxychloroquine (PLAQUENIL) 200 MG tablet Take 200 mg by mouth daily.   Marland Kitchen ipratropium-albuterol (DUONEB) 0.5-2.5 (3) MG/3ML SOLN Inhale 3 mLs into the lungs every 4 (four) hours as needed.   . latanoprost (XALATAN) 0.005 % ophthalmic solution Place 1 drop into both eyes at bedtime.   . pantoprazole (PROTONIX) 40 MG tablet TAKE 1 TABLET BY MOUTH 2  TIMES DAILY BEFORE A MEAL.   . predniSONE (DELTASONE) 10 MG tablet Take 10 mg by mouth daily with breakfast.   . ranitidine (ZANTAC) 150 MG tablet Take 1 tablet (150 mg total) by mouth 2 (two) times daily.   . theophylline (UNIPHYL) 400 MG 24 hr tablet Take 1 tablet (400 mg total) by mouth daily.   . TRAVATAN Z 0.004 % SOLN ophthalmic solution Place 1 drop into both eyes at bedtime.    . traZODone (DESYREL) 50 MG tablet Take 0.5-1 tablets (25-50 mg total) by mouth at bedtime as needed for sleep. 12/05/2016: Only taking as needed  . umeclidinium-vilanterol (ANORO ELLIPTA) 62.5-25 MCG/INH AEPB Inhale 1 puff daily    No facility-administered encounter medications on file as of 08/01/2017.     Surgical History: Past Surgical History:  Procedure Laterality Date  . ABDOMINAL HERNIA REPAIR  1995   Dr. Rochel Brome  . [place]  2011, 2012  x 2- Dr. Elvina Mattes    Medical History: Past Medical History:  Diagnosis Date  . Arthritis   . Asthma   . COPD (chronic obstructive pulmonary disease) ([place])   . Hypertension   . IDA (iron deficiency anemia)   . Lung mass     Family History: Family History  Problem Relation Age of Onset  . Stroke Father   . Heart disease Father   . Hypertension Father   . Heart disease Brother 95    Social History: Social History   Socioeconomic History  . Marital status: Widowed    Spouse name: Not on file  . Number of  children: 2  . Years of education: Not on file  . Highest education level: Not on file  Occupational History  . Occupation: Retired   Scientific laboratory technician  . Financial resource strain: Not on file  . Food insecurity:    Worry: Not on file    Inability: Not on file  . Transportation needs:    Medical: Not on file    Non-medical: Not on file  Tobacco Use  . Smoking status: Former Smoker    Packs/day: 2.00    Years: 63.00    Pack years: 126.00    Types: Cigarettes    Last attempt to quit: 09/13/2015    Years since quitting: 1.8  . Smokeless tobacco: Never Used  . Tobacco comment: Stopped smoking cigarettes Sunday and uses the vapor e-cig to help with cravings  Substance and Sexual Activity  . Alcohol use: Yes    Alcohol/week: 0.0 oz    Comment: 2-3 beers at night  . Drug use: No  . Sexual activity: Never  Lifestyle  . Physical activity:    Days per week: Not on file    Minutes per session: Not on file  . Stress: Not on file  Relationships  . Social connections:    Talks on phone: Not on file    Gets together: Not on file    Attends religious service: Not on file    Active member of club or organization: Not on file    Attends meetings of clubs or organizations: Not on file    Relationship status: Not on file  . Intimate partner violence:    Fear of current or ex partner: Not on file    Emotionally abused: Not on file    Physically abused: Not on file    Forced sexual activity: Not on file  Other Topics Concern  . Not on file  Social History Narrative   Lives in [place] with son.      2 children, 2 grandchildren             Vital Signs: Blood pressure 110/64, pulse 80, resp. rate 16, height '5\' 7"'$  (1.702 m), weight 170 lb (77.1 kg), SpO2 95 %.  Examination: General Appearance: The patient is well-developed, well-nourished, and in no distress. Skin: Gross inspection of skin unremarkable. Head: normocephalic, no gross deformities. Eyes: no gross deformities  noted. ENT: ears appear grossly normal no exudates. Neck: Supple. No thyromegaly. No LAD. Respiratory: no rhonchi noted at this time. Cardiovascular: Normal S1 and S2 without murmur or rub. Extremities: No cyanosis. pulses are equal. Neurologic: Alert and oriented. No involuntary movements.  LABS: Recent Results (from the past 2160 hour(s))  Lipid panel     Status: None   Collection Time: 05/19/17 10:51 AM  Result Value Ref Range   Cholesterol 150 0 - 200 mg/dL  Comment: ATP III Classification       Desirable:  < 200 mg/dL               Borderline High:  200 - 239 mg/dL          High:  > = 240 mg/dL   Triglycerides 85.0 0.0 - 149.0 mg/dL    Comment: Normal:  <150 mg/dLBorderline High:  150 - 199 mg/dL   HDL 85.10 >39.00 mg/dL   VLDL 17.0 0.0 - 40.0 mg/dL   LDL Cholesterol 48 0 - 99 mg/dL   Total CHOL/HDL Ratio 2     Comment:                Men          Women1/2 Average Risk     3.4          3.3Average Risk          5.0          4.42X Average Risk          9.6          7.13X Average Risk          15.0          11.0                       NonHDL 64.99     Comment: NOTE:  Non-HDL goal should be 30 mg/dL higher than patient's LDL goal (i.e. LDL goal of < 70 mg/dL, would have non-HDL goal of < 100 mg/dL)  Uric acid     Status: None   Collection Time: 05/19/17  2:36 PM  Result Value Ref Range   Uric Acid, Serum 4.8 4.0 - 7.8 mg/dL  Comprehensive metabolic panel     Status: Abnormal   Collection Time: 06/23/17  2:20 PM  Result Value Ref Range   Sodium 138 135 - 145 mmol/L   Potassium 4.2 3.5 - 5.1 mmol/L   Chloride 101 101 - 111 mmol/L   CO2 28 22 - 32 mmol/L   Glucose, Bld 135 (H) 65 - 99 mg/dL   BUN 12 6 - 20 mg/dL   Creatinine, Ser 0.80 0.61 - 1.24 mg/dL   Calcium 9.2 8.9 - 10.3 mg/dL   Total Protein 7.6 6.5 - 8.1 g/dL   Albumin 3.5 3.5 - 5.0 g/dL   AST 23 15 - 41 U/L   ALT 13 (L) 17 - 63 U/L   Alkaline Phosphatase 58 38 - 126 U/L   Total Bilirubin 0.6 0.3 - 1.2 mg/dL   GFR  calc non Af Amer >60 >60 mL/min   GFR calc Af Amer >60 >60 mL/min    Comment: (NOTE) The eGFR has been calculated using the CKD EPI equation. This calculation has not been validated in all clinical situations. eGFR's persistently <60 mL/min signify possible Chronic Kidney Disease.    Anion gap 9 5 - 15    Comment: Performed at [facility], [address] [place], [place] [address]  CBC with Differential/Platelet     Status: Abnormal   Collection Time: 06/23/17  2:20 PM  Result Value Ref Range   WBC 12.8 (H) 3.8 - 10.6 K/uL   RBC 4.35 (L) 4.40 - 5.90 MIL/uL   Hemoglobin 13.1 13.0 - 18.0 g/dL   HCT 39.5 (L) 40.0 - 52.0 %   MCV 90.8 80.0 - 100.0 fL   MCH 30.1 26.0 - 34.0 pg  MCHC 33.1 32.0 - 36.0 g/dL   RDW 15.7 (H) 11.5 - 14.5 %   Platelets 233 150 - 440 K/uL   Neutrophils Relative % 87 %   Neutro Abs 11.2 (H) 1.4 - 6.5 K/uL   Lymphocytes Relative 7 %   Lymphs Abs 0.9 (L) 1.0 - 3.6 K/uL   Monocytes Relative 4 %   Monocytes Absolute 0.5 0.2 - 1.0 K/uL   Eosinophils Relative 1 %   Eosinophils Absolute 0.1 0 - 0.7 K/uL   Basophils Relative 1 %   Basophils Absolute 0.1 0 - 0.1 K/uL    Comment: Performed at [facility], [address] [place], [place] [address]  Ferritin     Status: None   Collection Time: 06/23/17  2:20 PM  Result Value Ref Range   Ferritin 99 24 - 336 ng/mL    Comment: Performed at [facility], [address] [place], [place] [address]  Iron and TIBC     Status: Abnormal   Collection Time: 06/23/17  2:20 PM  Result Value Ref Range   Iron 44 (L) 45 - 182 ug/dL   TIBC 273 250 - 450 ug/dL   Saturation Ratios 16 (L) 17.9 - 39.5 %   UIBC 229 ug/dL    Comment: Performed at [facility], [address] [place], [place] [address]    Radiology: Ct Chest Wo Contrast  Result Date: 03/08/2017 CLINICAL DATA:  Followup lung cancer. EXAM: CT CHEST WITHOUT CONTRAST TECHNIQUE: Multidetector CT imaging of the chest was performed  following the standard protocol without IV contrast. COMPARISON:  09/06/2016 FINDINGS: Cardiovascular: The heart size appears within normal limits. Aortic atherosclerosis noted. Calcification within the LAD and left circumflex and RCA coronary arteries noted. Mediastinum/Nodes: The trachea appears patent and is midline. Normal appearance of the esophagus. No enlarged mediastinal or hilar adenopathy. No axillary or supraclavicular adenopathy. Lungs/Pleura: Advanced changes of bullous emphysema. Pulmonary nodule in the left upper lobe measures 1.2 cm and is unchanged from previous exam, image 66 of series 3. Stable nodule in the left upper lobe measuring 4 mm, image 58 of series 3. Upper Abdomen: Gallstones. Aortic atherosclerosis. No acute abnormality. Musculoskeletal: Degenerative disc disease noted. IMPRESSION: 1. No acute cardiopulmonary abnormalities. 2. Stable nodular densities within the left upper lobe 3. Advanced changes of bullous emphysema (ICD10-J43.9). 4. Aortic Atherosclerosis (ICD10-I70.0). Three vessel coronary artery calcifications noted. 5. Gallstones. Electronically Signed   By: Kerby Moors M.D.   On: 03/08/2017 15:25    No results found.  No results found.    Assessment and Plan: Patient Active Problem List   Diagnosis Date Noted  . Interstitial lung disease ([place]) 04/25/2017  . Iron deficiency anemia due to chronic blood loss 10/17/2016  . COPD exacerbation ([place]) 10/09/2016  . Back pain 10/09/2016  . Lower extremity edema 10/09/2016  . Hyperlipidemia 03/03/2016  . Primary cancer of left upper lobe of lung ([place]) 11/09/2015  . Insomnia 10/16/2015  . Rheumatoid arthritis ([place]) 08/17/2015  . OP (osteoporosis) 08/17/2015  . Gout 08/17/2015  . Glaucoma 08/17/2015  . Coronary artery disease 07/22/2014  . Lumbar canal stenosis 08/28/2013  . GERD (gastroesophageal reflux disease) 06/29/2011  . Iron deficiency anemia 06/29/2011  . Essential hypertension 06/07/2011  . Benign  prostatic hypertrophy with urinary frequency 06/07/2011  . COPD, severe ([place]) 06/07/2011    1. COPD severe disease has been stable will continue with present management. He states that he is vaping still howvere 2. LUL cancer will continue  with present management per omcology 3. GERD stable at his time 4. RA stable will monitor  General Counseling: I have discussed the findings of the evaluation and examination with Peter Johnston.  I have also discussed any further diagnostic evaluation thatmay be needed or ordered today. Peter Johnston verbalizes understanding of the findings of todays visit. We also reviewed his medications today and discussed drug interactions and side effects including but not limited excessive drowsiness and altered mental states. We also discussed that there is always a risk not just to him but also people around him. he has been encouraged to call the office with any questions or concerns that should arise related to todays visit.    Time spent: 95mn  I have personally obtained a history, examined the patient, evaluated laboratory and imaging results, formulated the assessment and plan and placed orders.    SAllyne Gee MD FNortheast [facility] Health Services Trinity HospitalPulmonary and Critical Care Sleep medicine

## 2017-08-02 ENCOUNTER — Ambulatory Visit (INDEPENDENT_AMBULATORY_CARE_PROVIDER_SITE_OTHER): Payer: Medicare Other | Admitting: Family Medicine

## 2017-08-02 ENCOUNTER — Encounter: Payer: Self-pay | Admitting: Family Medicine

## 2017-08-02 ENCOUNTER — Other Ambulatory Visit: Payer: Self-pay

## 2017-08-02 VITALS — BP 120/68 | HR 78 | Temp 98.4°F | Ht 68.0 in | Wt 160.8 lb

## 2017-08-02 DIAGNOSIS — R29898 Other symptoms and signs involving the musculoskeletal system: Secondary | ICD-10-CM | POA: Diagnosis not present

## 2017-08-02 DIAGNOSIS — J449 Chronic obstructive pulmonary disease, unspecified: Secondary | ICD-10-CM

## 2017-08-02 DIAGNOSIS — M069 Rheumatoid arthritis, unspecified: Secondary | ICD-10-CM | POA: Diagnosis not present

## 2017-08-02 DIAGNOSIS — I10 Essential (primary) hypertension: Secondary | ICD-10-CM | POA: Diagnosis not present

## 2017-08-02 NOTE — Assessment & Plan Note (Signed)
Stable on current regimen   

## 2017-08-02 NOTE — Assessment & Plan Note (Addendum)
He will continue to follow with rheumatology.  He will contact his insurance company regarding coverage of the urine tramadol test and then he will touch base with his rheumatologist as the patient noted he may want to come off of the tramadol.  Advised him not to discontinue it without consulting with his rheumatologist.  He will also touch base regarding going back on Plaquenil as his joint pain seemed to worsen after coming off of the Plaquenil.  He will continue to follow with rheumatology regarding his joint pain in his left hand.

## 2017-08-02 NOTE — Progress Notes (Signed)
Tommi Rumps, MD Phone: [phone]  Peter Johnston is a 77 y.o. male who presents today for f/u.  HYPERTENSION  Disease Monitoring  Home BP Monitoring not checking Chest pain- no    Dyspnea- chronic and stable related to COPD Medications  Compliance-  Taking atenolol.   Edema- no  COPD: He is taking Breo, theophylline, and prednisone.  He does have duo nebs to use as needed.  He notes his shortness of breath is stable and unchanged.  He saw his pulmonologist yesterday.  No cough or wheezing.  He typically uses 3-1/2 or 4 L of oxygen at home.  His portable oxygen only goes up to 3 L.  His oxygen was 83% when he came in though he was only on 2 L.  He was up to 91% when we placed him on his typical 4 L.  Rheumatoid arthritis:  He is following with rheumatology.  He does note some joint pain in his left hand which he reports his rheumatologist is aware of.  He is on Enbrel.  Takes tramadol which is somewhat beneficial for about an hour.  He does get somewhat drowsy with this.  He comes in with an order for a urine tramadol test.  Last time this was obtained he got a bill.  He reports his legs feel little weak way when he gets up in the morning.  He has to sit there to try to get his legs warmed up to get going.  No leg pain.  No falls.  He would be interested in doing physical therapy.    Social History   Tobacco Use  Smoking Status Former Smoker  . Packs/day: 2.00  . Years: 63.00  . Pack years: 126.00  . Types: Cigarettes  . Last attempt to quit: 09/13/2015  . Years since quitting: 1.8  Smokeless Tobacco Never Used  Tobacco Comment   Stopped smoking cigarettes Sunday and uses the vapor e-cig to help with cravings     ROS see history of present illness  Objective  Physical Exam Vitals:   08/02/17 1315  BP: 120/68  Pulse: 78  Temp: 98.4 F (36.9 C)  SpO2: 91%    BP Readings from Last 3 Encounters:  08/02/17 120/68  08/01/17 110/64  06/23/17 108/64   Wt Readings  from Last 3 Encounters:  08/02/17 160 lb 12.8 oz (72.9 kg)  08/01/17 170 lb (77.1 kg)  06/23/17 164 lb (74.4 kg)    Physical Exam  Constitutional: No distress.  Cardiovascular: Normal rate, regular rhythm and normal heart sounds.  Pulmonary/Chest: Effort normal. No respiratory distress.  Minimal faint scattered wheezes, no crackles or other abnormal breath sounds  Musculoskeletal: He exhibits no edema.  Slight tenderness to the joints in his left hand, no obvious swelling, warmth, or erythema  Neurological: He is alert.  5/5 strength right grip, bilateral quads, hamstrings, plantar flexion, and dorsiflexion, 4+/5 strength left grip related to discomfort, sensation light touch intact bilateral upper and lower extremities  Skin: Skin is warm and dry. He is not diaphoretic.     Assessment/Plan: Please see individual problem list.  Rheumatoid arthritis ([place]) He will continue to follow with rheumatology.  He will contact his insurance company regarding coverage of the urine tramadol test and then he will touch base with his rheumatologist as the patient noted he may want to come off of the tramadol.  Advised him not to discontinue it without consulting with his rheumatologist.  He will also touch base regarding going  back on Plaquenil as his joint pain seemed to worsen after coming off of the Plaquenil.  He will continue to follow with rheumatology regarding his joint pain in his left hand.  COPD, severe ([place]) Overall seems stable.  He has been using 3-1/2 or 4 L at home.  Oxygen was slightly low when he came into the office though he was only on 2 L of oxygen.  Came up to the normal range when placed on 4 L.  He will continue to see his pulmonologist.  He will continue his current regimen through them.  Given return precautions.  Essential hypertension Stable on current regimen.  Weakness of both lower extremities Likely related to deconditioning.  His strength testing on exam is normal in  his lower extremities.  We will place a home health referral for physical therapy.   Orders Placed This Encounter  Procedures  . Ambulatory referral to Home Health    Referral Priority:   Routine    Referral Type:   Home Health Care    Referral Reason:   Specialty Services Required    Requested Specialty:   [place]    Number of Visits Requested:   1    No orders of the defined types were placed in this encounter.    Tommi Rumps, MD [place]

## 2017-08-02 NOTE — Assessment & Plan Note (Signed)
Overall seems stable.  He has been using 3-1/2 or 4 L at home.  Oxygen was slightly low when he came into the office though he was only on 2 L of oxygen.  Came up to the normal range when placed on 4 L.  He will continue to see his pulmonologist.  He will continue his current regimen through them.  Given return precautions.

## 2017-08-02 NOTE — Assessment & Plan Note (Signed)
Likely related to deconditioning.  His strength testing on exam is normal in his lower extremities.  We will place a home health referral for physical therapy.

## 2017-08-02 NOTE — Patient Instructions (Signed)
Nice to see you. Please make sure you use the appropriate amount of oxygen at home.  If your breathing worsens please be evaluated. Please check with your insurance company regarding the urine test for your tramadol.  Please contact your rheumatologist as well to discuss discontinuing this as you mentioned you thought about discontinuing this during your visit. We will get you to see physical therapy at home to help with leg strength.

## 2017-08-11 DIAGNOSIS — R0602 Shortness of breath: Secondary | ICD-10-CM | POA: Diagnosis not present

## 2017-08-11 DIAGNOSIS — J9610 Chronic respiratory failure, unspecified whether with hypoxia or hypercapnia: Secondary | ICD-10-CM | POA: Diagnosis not present

## 2017-08-11 DIAGNOSIS — J449 Chronic obstructive pulmonary disease, unspecified: Secondary | ICD-10-CM | POA: Diagnosis not present

## 2017-08-22 DIAGNOSIS — J42 Unspecified chronic bronchitis: Secondary | ICD-10-CM | POA: Diagnosis not present

## 2017-08-22 DIAGNOSIS — J449 Chronic obstructive pulmonary disease, unspecified: Secondary | ICD-10-CM | POA: Diagnosis not present

## 2017-08-23 ENCOUNTER — Other Ambulatory Visit: Payer: Self-pay | Admitting: Nurse Practitioner

## 2017-08-23 MED ORDER — PREDNISONE 10 MG PO TABS: 10.0000 mg | ORAL_TABLET | Freq: Every day | ORAL | 1 refills | Status: DC

## 2017-09-11 DIAGNOSIS — J9610 Chronic respiratory failure, unspecified whether with hypoxia or hypercapnia: Secondary | ICD-10-CM | POA: Diagnosis not present

## 2017-09-11 DIAGNOSIS — R0602 Shortness of breath: Secondary | ICD-10-CM | POA: Diagnosis not present

## 2017-09-11 DIAGNOSIS — J449 Chronic obstructive pulmonary disease, unspecified: Secondary | ICD-10-CM | POA: Diagnosis not present

## 2017-09-20 ENCOUNTER — Ambulatory Visit
Admission: RE | Admit: 2017-09-20 | Discharge: 2017-09-20 | Disposition: A | Discharge status: Home / Self Care | Payer: Medicare Other | Source: Ambulatory Visit | Attending: Internal Medicine | Admitting: Internal Medicine

## 2017-09-20 DIAGNOSIS — C3412 Malignant neoplasm of upper lobe, left bronchus or lung: Secondary | ICD-10-CM | POA: Diagnosis not present

## 2017-09-20 DIAGNOSIS — J432 Centrilobular emphysema: Secondary | ICD-10-CM | POA: Insufficient documentation

## 2017-09-22 ENCOUNTER — Other Ambulatory Visit: Payer: Self-pay

## 2017-09-22 ENCOUNTER — Encounter: Payer: Self-pay | Admitting: Internal Medicine

## 2017-09-22 ENCOUNTER — Inpatient Hospital Stay: Payer: Medicare Other | Attending: Internal Medicine

## 2017-09-22 ENCOUNTER — Inpatient Hospital Stay (HOSPITAL_BASED_OUTPATIENT_CLINIC_OR_DEPARTMENT_OTHER): Payer: Medicare Other | Admitting: Internal Medicine

## 2017-09-22 VITALS — BP 129/87 | HR 70 | Temp 97.9°F | Resp 22 | Ht 68.0 in | Wt 162.0 lb

## 2017-09-22 DIAGNOSIS — C3412 Malignant neoplasm of upper lobe, left bronchus or lung: Secondary | ICD-10-CM

## 2017-09-22 DIAGNOSIS — Z87891 Personal history of nicotine dependence: Secondary | ICD-10-CM | POA: Diagnosis not present

## 2017-09-22 DIAGNOSIS — I10 Essential (primary) hypertension: Secondary | ICD-10-CM

## 2017-09-22 DIAGNOSIS — J45909 Unspecified asthma, uncomplicated: Secondary | ICD-10-CM | POA: Insufficient documentation

## 2017-09-22 DIAGNOSIS — J449 Chronic obstructive pulmonary disease, unspecified: Secondary | ICD-10-CM | POA: Diagnosis not present

## 2017-09-22 DIAGNOSIS — D509 Iron deficiency anemia, unspecified: Secondary | ICD-10-CM | POA: Insufficient documentation

## 2017-09-22 DIAGNOSIS — Z79899 Other long term (current) drug therapy: Secondary | ICD-10-CM

## 2017-09-22 DIAGNOSIS — M129 Arthropathy, unspecified: Secondary | ICD-10-CM | POA: Insufficient documentation

## 2017-09-22 DIAGNOSIS — R5382 Chronic fatigue, unspecified: Secondary | ICD-10-CM | POA: Diagnosis not present

## 2017-09-22 LAB — CBC WITH DIFFERENTIAL/PLATELET
Basophils Absolute: 0.1 10*3/uL (ref 0–0.1)
Basophils Relative: 1 %
EOS ABS: 0.1 10*3/uL (ref 0–0.7)
EOS PCT: 1 %
HCT: 39.6 % — ABNORMAL LOW (ref 40.0–52.0)
Hemoglobin: 13.2 g/dL (ref 13.0–18.0)
LYMPHS ABS: 0.7 10*3/uL — AB (ref 1.0–3.6)
LYMPHS PCT: 5 %
MCH: 29.8 pg (ref 26.0–34.0)
MCHC: 33.2 g/dL (ref 32.0–36.0)
MCV: 89.6 fL (ref 80.0–100.0)
MONOS PCT: 4 %
Monocytes Absolute: 0.5 10*3/uL (ref 0.2–1.0)
Neutro Abs: 11.9 10*3/uL — ABNORMAL HIGH (ref 1.4–6.5)
Neutrophils Relative %: 89 %
PLATELETS: 192 10*3/uL (ref 150–440)
RBC: 4.42 MIL/uL (ref 4.40–5.90)
RDW: 16.3 % — AB (ref 11.5–14.5)
WBC: 13.2 10*3/uL — ABNORMAL HIGH (ref 3.8–10.6)

## 2017-09-22 LAB — IRON AND TIBC
IRON: 44 ug/dL — AB (ref 45–182)
Saturation Ratios: 15 % — ABNORMAL LOW (ref 17.9–39.5)
TIBC: 290 ug/dL (ref 250–450)
UIBC: 246 ug/dL

## 2017-09-22 LAB — FERRITIN: FERRITIN: 134 ng/mL (ref 24–336)

## 2017-09-22 NOTE — Progress Notes (Signed)
[facility] OFFICE PROGRESS NOTE  Patient Care Team: Leone Haven, MD as PCP - General (Family Medicine) Allyne Gee, MD (Pulmonary Disease) Lollie Sails, MD as Consulting Physician (Gastroenterology)  Cancer Staging No matching staging information was found for the patient.   Oncology History   # April 2016-LUL LUNG CA [clinical; no Bx sec to high risk] LUL LUNG NODULE [47mm]; MAY 2017- 23mm; PET avid- no distant mets s/p SBRT [end of June 2017];   # NOV 22nd CT scan- Improved LUL nodule  #Iron deficiency anemia-September 2018 [poor candidate for EGD colonoscopy; Dr.Skulskie] ? Gastritis vs AV malformation  # COPD- home O2 [Dr.Saadat Khan] -------------------------------------------------------------   DIAGNOSIS: Left upper lobe non-small cell lung cancer   STAGE:  I       ;GOALS: Cure/control  CURRENT/MOST RECENT THERAPY: Surveillance.      Primary cancer of left upper lobe of lung ([place])   11/09/2015 Initial Diagnosis    Primary cancer of left upper lobe of lung ([place])         INTERVAL HISTORY:  Peter Johnston 77 y.o.  male pleasant patient above history of stage I lung cancer status post SBRT; iron deficiency anemia is here for follow-up and review the CAT scan.  Patient complains of chronic shortness of breath chronic cough chronic fatigue.  Denies any redness of his breakfast with abdominal pain.  Review of Systems  Constitutional: Positive for malaise/fatigue. Negative for chills, diaphoresis, fever and weight loss.  HENT: Negative for nosebleeds and sore throat.   Eyes: Negative for double vision.  Respiratory: Positive for cough, sputum production, shortness of breath and wheezing. Negative for hemoptysis.   Cardiovascular: Negative for chest pain, palpitations, orthopnea and leg swelling.  Gastrointestinal: Negative for abdominal pain, blood in stool, constipation, diarrhea, heartburn, melena, nausea and vomiting.  Genitourinary:  Negative for dysuria, frequency and urgency.  Musculoskeletal: Positive for back pain. Negative for joint pain.  Skin: Negative.  Negative for itching and rash.  Neurological: Negative for dizziness, tingling, focal weakness, weakness and headaches.  Endo/Heme/Allergies: Does not bruise/bleed easily.  Psychiatric/Behavioral: Negative for depression. The patient is not nervous/anxious and does not have insomnia.       PAST MEDICAL HISTORY :  Past Medical History:  Diagnosis Date  . Arthritis   . Asthma   . COPD (chronic obstructive pulmonary disease) ([place])   . Hypertension   . IDA (iron deficiency anemia)   . Lung mass     PAST SURGICAL HISTORY :   Past Surgical History:  Procedure Laterality Date  . ABDOMINAL HERNIA REPAIR  1995   Dr. Rochel Brome  . HAMMER TOE SURGERY  2011, 2012   x 2- Dr. Elvina Mattes    FAMILY HISTORY :   Family History  Problem Relation Age of Onset  . Stroke Father   . Heart disease Father   . Hypertension Father   . Heart disease Brother 17    SOCIAL HISTORY:   Social History   Tobacco Use  . Smoking status: Former Smoker    Packs/day: 2.00    Years: 63.00    Pack years: 126.00    Types: Cigarettes    Last attempt to quit: 09/13/2015    Years since quitting: 2.0  . Smokeless tobacco: Never Used  . Tobacco comment: Stopped smoking cigarettes Sunday and uses the vapor e-cig to help with cravings  Substance Use Topics  . Alcohol use: Yes    Alcohol/week: 0.0 oz  Comment: 2-3 beers at night  . Drug use: No    ALLERGIES:  is allergic to tamsulosin; gold-containing drug products; hydrochlorothiazide; and sulfa antibiotics.  MEDICATIONS:  Current Outpatient Medications  Medication Sig Dispense Refill  . albuterol (PROVENTIL HFA;VENTOLIN HFA) 108 (90 Base) MCG/ACT inhaler Inhale 2 puffs into the lungs every 6 (six) hours as needed for wheezing or shortness of breath. 1 Inhaler 2  . allopurinol (ZYLOPRIM) 300 MG tablet Take 300 mg by mouth  daily.    Marland Kitchen atenolol (TENORMIN) 25 MG tablet TAKE 1 TABLET DAILY 90 tablet 3  . brimonidine (ALPHAGAN) 0.2 % ophthalmic solution Place 1 drop into both eyes 2 (two) times daily.    . ENBREL 50 MG/ML injection 50 mg once a week.     . fenofibrate (TRICOR) 145 MG tablet Take 1 tablet (145 mg total) by mouth daily. 90 tablet 3  . Fluticasone Furoate-Vilanterol (BREO ELLIPTA) 100-25 MCG/INH AEPB Inhale 1 puff into the lungs daily. 60 each 6  . ipratropium-albuterol (DUONEB) 0.5-2.5 (3) MG/3ML SOLN Inhale 3 mLs into the lungs every 4 (four) hours as needed. 90 mL 3  . latanoprost (XALATAN) 0.005 % ophthalmic solution Place 1 drop into both eyes at bedtime.    . pantoprazole (PROTONIX) 40 MG tablet TAKE 1 TABLET BY MOUTH 2  TIMES DAILY BEFORE A MEAL. 180 tablet 0  . predniSONE (DELTASONE) 10 MG tablet Take 1 tablet (10 mg total) by mouth daily with breakfast. 30 tablet 1  . ranitidine (ZANTAC) 150 MG tablet Take 1 tablet (150 mg total) by mouth 2 (two) times daily. 180 tablet 1  . theophylline (UNIPHYL) 400 MG 24 hr tablet Take 1 tablet (400 mg total) by mouth daily. 30 tablet 4  . traMADol (ULTRAM) 50 MG tablet Take 50 mg by mouth daily.    . TRAVATAN Z 0.004 % SOLN ophthalmic solution Place 1 drop into both eyes at bedtime.     Marland Kitchen umeclidinium-vilanterol (ANORO ELLIPTA) 62.5-25 MCG/INH AEPB Inhale 1 puff daily 3 each 1  . furosemide (LASIX) 20 MG tablet Take 1 tablet by mouth as needed for edema.     No current facility-administered medications for this visit.     PHYSICAL EXAMINATION: ECOG PERFORMANCE STATUS: 2 - Symptomatic, <50% confined to bed  BP 129/87 (Patient Position: Sitting)   Pulse 70   Temp 97.9 F (36.6 C) (Tympanic)   Resp (!) 22   Ht 5\' 8"  (1.727 m)   Wt 162 lb (73.5 kg)   SpO2 92%   BMI 24.63 kg/m   Filed Weights   09/22/17 1436  Weight: 162 lb (73.5 kg)    GENERAL: Well-nourished well-developed; Alert, no distress and comfortable.  Accompanied by his wife he is in  a wheelchair.  O2.   EYES: no pallor or icterus OROPHARYNX: no thrush or ulceration; NECK: supple; no lymph nodes felt. LYMPH:  no palpable lymphadenopathy in the axillary or inguinal regions LUNGS: Decreased breath sounds auscultation bilaterally. No wheeze or crackles HEART/CVS: regular rate & rhythm and no murmurs; No lower extremity edema ABDOMEN:abdomen soft, non-tender and normal bowel sounds. No hepatomegaly or splenomegaly.  Musculoskeletal:no cyanosis of digits and no clubbing  PSYCH: alert & oriented x 3 with fluent speech NEURO: no focal motor/sensory deficits SKIN:  no rashes or significant lesions    LABORATORY DATA:  I have reviewed the data as listed    Component Value Date/Time   NA 138 06/23/2017 1420   NA 139 02/01/2012 0446  K 4.2 06/23/2017 1420   K 4.5 02/01/2012 0446   CL 101 06/23/2017 1420   CL 105 02/01/2012 0446   CO2 28 06/23/2017 1420   CO2 29 02/01/2012 0446   GLUCOSE 135 (H) 06/23/2017 1420   GLUCOSE 127 (H) 02/01/2012 0446   BUN 12 06/23/2017 1420   BUN 18 02/01/2012 0446   CREATININE 0.80 06/23/2017 1420   CREATININE 0.62 02/01/2012 0446   CALCIUM 9.2 06/23/2017 1420   CALCIUM 9.2 02/01/2012 0446   PROT 7.6 06/23/2017 1420   PROT 8.2 01/30/2012 1859   ALBUMIN 3.5 06/23/2017 1420   ALBUMIN 2.8 (L) 01/30/2012 1859   AST 23 06/23/2017 1420   AST 17 01/30/2012 1859   ALT 13 (L) 06/23/2017 1420   ALT 17 01/30/2012 1859   ALKPHOS 58 06/23/2017 1420   ALKPHOS 81 01/30/2012 1859   BILITOT 0.6 06/23/2017 1420   BILITOT 0.4 01/30/2012 1859   GFRNONAA >60 06/23/2017 1420   GFRNONAA >60 02/01/2012 0446   GFRAA >60 06/23/2017 1420   GFRAA >60 02/01/2012 0446    No results found for: SPEP, UPEP  Lab Results  Component Value Date   WBC 13.2 (H) 09/22/2017   NEUTROABS 11.9 (H) 09/22/2017   HGB 13.2 09/22/2017   HCT 39.6 (L) 09/22/2017   MCV 89.6 09/22/2017   PLT 192 09/22/2017      Chemistry      Component Value Date/Time   NA 138  06/23/2017 1420   NA 139 02/01/2012 0446   K 4.2 06/23/2017 1420   K 4.5 02/01/2012 0446   CL 101 06/23/2017 1420   CL 105 02/01/2012 0446   CO2 28 06/23/2017 1420   CO2 29 02/01/2012 0446   BUN 12 06/23/2017 1420   BUN 18 02/01/2012 0446   CREATININE 0.80 06/23/2017 1420   CREATININE 0.62 02/01/2012 0446      Component Value Date/Time   CALCIUM 9.2 06/23/2017 1420   CALCIUM 9.2 02/01/2012 0446   ALKPHOS 58 06/23/2017 1420   ALKPHOS 81 01/30/2012 1859   AST 23 06/23/2017 1420   AST 17 01/30/2012 1859   ALT 13 (L) 06/23/2017 1420   ALT 17 01/30/2012 1859   BILITOT 0.6 06/23/2017 1420   BILITOT 0.4 01/30/2012 1859       RADIOGRAPHIC STUDIES: I have personally reviewed the radiological images as listed and agreed with the findings in the report. No results found.   ASSESSMENT & PLAN:  Primary cancer of left upper lobe of lung ([place]) # # LEFT UPPER LOBE LUNG CA- STAGE I-status post SBRT June 2017.  June 2019-improved lung nodule; lung nodules.  We will plan to repeat a CT scan in 6 months.  #Iron deficiency anemia unclear etiology; today hemoglobin is 13 saturation 16%; recommend holding off any IV iron.  #Chronic severe COPD-stable; with worsening cough/phlegm recommend adding Mucinex.  Continue follow-up pulmonary.  #Chronic fatigue-likely second advance COPD.   #Follow-up in 3 months labs; possible Venofer.  # I reviewed the blood work- with the patient in detail; also reviewed the imaging independently [as summarized above]; and with the patient in detail.     Orders Placed This Encounter  Procedures  . CBC with Differential/Platelet    Standing Status:   Future    Standing Expiration Date:   09/23/2018  . Comprehensive metabolic panel    Standing Status:   Future    Standing Expiration Date:   09/23/2018  . Ferritin    Standing Status:   Future  Standing Expiration Date:   09/23/2018  . Iron and TIBC    Standing Status:   Future    Standing Expiration Date:    09/23/2018   All questions were answered. The patient knows to call the clinic with any problems, questions or concerns.      Cammie Sickle, MD 09/23/2017 10:39 PM

## 2017-09-22 NOTE — Assessment & Plan Note (Addendum)
# #   LEFT UPPER LOBE LUNG CA- STAGE I-status post SBRT June 2017.  June 2019-improved lung nodule; lung nodules.  We will plan to repeat a CT scan in 6 months.  #Iron deficiency anemia unclear etiology; today hemoglobin is 13 saturation 16%; recommend holding off any IV iron.  #Chronic severe COPD-stable; with worsening cough/phlegm recommend adding Mucinex.  Continue follow-up pulmonary.  #Chronic fatigue-likely second advance COPD.   #Follow-up in 3 months labs; possible Venofer.  # I reviewed the blood work- with the patient in detail; also reviewed the imaging independently [as summarized above]; and with the patient in detail.

## 2017-10-01 ENCOUNTER — Other Ambulatory Visit: Payer: Self-pay | Admitting: Internal Medicine

## 2017-10-01 DIAGNOSIS — J449 Chronic obstructive pulmonary disease, unspecified: Secondary | ICD-10-CM

## 2017-10-11 DIAGNOSIS — J9610 Chronic respiratory failure, unspecified whether with hypoxia or hypercapnia: Secondary | ICD-10-CM | POA: Diagnosis not present

## 2017-10-11 DIAGNOSIS — R0602 Shortness of breath: Secondary | ICD-10-CM | POA: Diagnosis not present

## 2017-10-11 DIAGNOSIS — J449 Chronic obstructive pulmonary disease, unspecified: Secondary | ICD-10-CM | POA: Diagnosis not present

## 2017-10-17 ENCOUNTER — Ambulatory Visit: Payer: Medicare Other

## 2017-10-25 DIAGNOSIS — R3915 Urgency of urination: Secondary | ICD-10-CM | POA: Diagnosis not present

## 2017-10-25 DIAGNOSIS — M545 Low back pain: Secondary | ICD-10-CM | POA: Diagnosis not present

## 2017-10-30 ENCOUNTER — Other Ambulatory Visit: Payer: Self-pay

## 2017-10-30 MED ORDER — PREDNISONE 10 MG PO TABS: 10.0000 mg | ORAL_TABLET | Freq: Every day | ORAL | 0 refills | Status: DC

## 2017-11-03 ENCOUNTER — Ambulatory Visit (INDEPENDENT_AMBULATORY_CARE_PROVIDER_SITE_OTHER): Payer: Medicare Other

## 2017-11-03 ENCOUNTER — Ambulatory Visit (INDEPENDENT_AMBULATORY_CARE_PROVIDER_SITE_OTHER): Payer: Medicare Other | Admitting: Family Medicine

## 2017-11-03 ENCOUNTER — Encounter: Payer: Self-pay | Admitting: Family Medicine

## 2017-11-03 VITALS — BP 128/80 | HR 85 | Temp 98.0°F

## 2017-11-03 DIAGNOSIS — R197 Diarrhea, unspecified: Secondary | ICD-10-CM

## 2017-11-03 DIAGNOSIS — M8448XA Pathological fracture, other site, initial encounter for fracture: Secondary | ICD-10-CM | POA: Diagnosis not present

## 2017-11-03 DIAGNOSIS — R1031 Right lower quadrant pain: Secondary | ICD-10-CM | POA: Diagnosis not present

## 2017-11-03 DIAGNOSIS — M545 Low back pain, unspecified: Secondary | ICD-10-CM

## 2017-11-03 DIAGNOSIS — J449 Chronic obstructive pulmonary disease, unspecified: Secondary | ICD-10-CM | POA: Diagnosis not present

## 2017-11-03 DIAGNOSIS — R1032 Left lower quadrant pain: Secondary | ICD-10-CM

## 2017-11-03 LAB — POCT URINALYSIS DIPSTICK
Glucose, UA: NEGATIVE
Ketones, UA: NEGATIVE
NITRITE UA: NEGATIVE
PROTEIN UA: POSITIVE — AB
RBC UA: NEGATIVE
SPEC GRAV UA: 1.01 (ref 1.010–1.025)
Urobilinogen, UA: 1 E.U./dL
pH, UA: 8.5 — AB (ref 5.0–8.0)

## 2017-11-03 NOTE — Patient Instructions (Signed)
Nice to see you.  We will get labs and an x-ray today to evaluate for a cause of your symptoms.  If you develop worsening back pain or you develop abdominal discomfort, lack of passing gas, passing blood in your stool, or you have fevers, or feel increasingly poorly please seek medical attention.

## 2017-11-04 LAB — URINE CULTURE
MICRO NUMBER:: 90945897
SPECIMEN QUALITY:: ADEQUATE

## 2017-11-04 LAB — CBC WITH DIFFERENTIAL/PLATELET
BASOS ABS: 54 {cells}/uL (ref 0–200)
Basophils Relative: 0.4 %
EOS PCT: 0.2 %
Eosinophils Absolute: 27 cells/uL (ref 15–500)
HCT: 39.4 % (ref 38.5–50.0)
Hemoglobin: 12.9 g/dL — ABNORMAL LOW (ref 13.2–17.1)
Lymphs Abs: 530 cells/uL — ABNORMAL LOW (ref 850–3900)
MCH: 29.1 pg (ref 27.0–33.0)
MCHC: 32.7 g/dL (ref 32.0–36.0)
MCV: 88.7 fL (ref 80.0–100.0)
MONOS PCT: 3.3 %
MPV: 12.2 fL (ref 7.5–12.5)
NEUTROS PCT: 92.2 %
Neutro Abs: 12539 cells/uL — ABNORMAL HIGH (ref 1500–7800)
Platelets: 266 10*3/uL (ref 140–400)
RBC: 4.44 10*6/uL (ref 4.20–5.80)
RDW: 14.4 % (ref 11.0–15.0)
TOTAL LYMPHOCYTE: 3.9 %
WBC mixed population: 449 cells/uL (ref 200–950)
WBC: 13.6 10*3/uL — AB (ref 3.8–10.8)

## 2017-11-04 LAB — COMPREHENSIVE METABOLIC PANEL
AG Ratio: 1.1 (calc) (ref 1.0–2.5)
ALBUMIN MSPROF: 3.6 g/dL (ref 3.6–5.1)
ALT: 9 U/L (ref 9–46)
AST: 15 U/L (ref 10–35)
Alkaline phosphatase (APISO): 111 U/L (ref 40–115)
BILIRUBIN TOTAL: 0.6 mg/dL (ref 0.2–1.2)
BUN: 18 mg/dL (ref 7–25)
CALCIUM: 9.3 mg/dL (ref 8.6–10.3)
CO2: 31 mmol/L (ref 20–32)
CREATININE: 0.87 mg/dL (ref 0.70–1.18)
Chloride: 97 mmol/L — ABNORMAL LOW (ref 98–110)
GLUCOSE: 110 mg/dL — AB (ref 65–99)
Globulin: 3.4 g/dL (calc) (ref 1.9–3.7)
POTASSIUM: 4.6 mmol/L (ref 3.5–5.3)
SODIUM: 138 mmol/L (ref 135–146)
TOTAL PROTEIN: 7 g/dL (ref 6.1–8.1)

## 2017-11-04 LAB — URINALYSIS, MICROSCOPIC ONLY
Bacteria, UA: NONE SEEN /HPF
Hyaline Cast: NONE SEEN /LPF
Squamous Epithelial / LPF: NONE SEEN /HPF (ref <=5)

## 2017-11-04 NOTE — Assessment & Plan Note (Signed)
Intermittent issues with this alternating with constipation.  Some slight lower abdominal tenderness.  He has good bowel sounds and is passing some gas thus I do not think he is obstructed.  We will check urine studies to evaluate for UTI as cause of abdominal discomfort.  We will check CMP and CBC.  Consider imaging if significantly elevated WBC.  Will need to consider stool studies as well if this continues.

## 2017-11-04 NOTE — Assessment & Plan Note (Addendum)
Chronic issue.  Seems to be relatively stable with the exception of his back pain limiting his ability to take deep breaths.  We will evaluate his back pain.  He will continue his current treatment for COPD.

## 2017-11-04 NOTE — Assessment & Plan Note (Addendum)
Patient has had continued issues with back pain over the last month.  Unlikely urologic source.  We will recheck his urine.  We will check an x-ray to evaluate for bony pathology.  I suspect this is musculoskeletal.  He will monitor at this time.  Given return precautions.

## 2017-11-04 NOTE — Progress Notes (Signed)
Tommi Rumps, MD Phone: [phone]  MARGARET STAGGS is a 77 y.o. male who presents today for f/u.  CC: COPD, low back pain, diarrhea  COPD: Patient is using his inhalers.  No coughing.  Some intermittent wheezing.  He has chronic shortness of breath that may be slightly worse given that he has had some low back pain that does make it a little difficult to take a deep breath at times.  He typically wears oxygen at 3-3.5 L.  He had it at 1 L when he came into the office and his oxygen was 86%.  It came up to 91% with going on his typical level of oxygen.  Low back pain: Patient was seen at urgent care for low back pain and he felt he may have had a UTI.  He had some urinary urgency and odor at that time.  They placed him on Cefdinir and sent a culture off.  The culture was negative.  He has had sharp pain in his low back for about a month.  No injury.  No radiation.  Bothers him only if he twists or moves in a certain direction.  His legs feel weak though nothing focal.  No numbness.  No saddle anesthesia or bowel or bladder incontinence.  Patient reports that he has had intermittent diarrhea off and on for the last 2 to 3 months.  He will occasionally be constipated as well.  He notes last bowel movement was 2 days ago.  He is passing some gas though not much.  He has had some intermittent lower abdominal discomfort over this period of time as well.  That has been going on for several weeks.  Is not constant.  No blood in his stool.  No history of abdominal surgeries.  He does note having had some urinary symptoms previously.  Social History   Tobacco Use  Smoking Status Former Smoker  . Packs/day: 2.00  . Years: 63.00  . Pack years: 126.00  . Types: Cigarettes  . Last attempt to quit: 09/13/2015  . Years since quitting: 2.1  Smokeless Tobacco Never Used  Tobacco Comment   Stopped smoking cigarettes Sunday and uses the vapor e-cig to help with cravings     ROS see history of present  illness  Objective  Physical Exam Vitals:   11/03/17 1455  BP: 128/80  Pulse: 85  Temp: 98 F (36.7 C)  SpO2: 91%    BP Readings from Last 3 Encounters:  11/03/17 128/80  09/22/17 129/87  08/02/17 120/68   Wt Readings from Last 3 Encounters:  09/22/17 162 lb (73.5 kg)  08/02/17 160 lb 12.8 oz (72.9 kg)  08/01/17 170 lb (77.1 kg)    Physical Exam  Constitutional: No distress.  Cardiovascular: Normal rate, regular rhythm and normal heart sounds.  Pulmonary/Chest: Effort normal and breath sounds normal.  Abdominal: Soft. Bowel sounds are normal. He exhibits no distension and no mass. There is tenderness (mild lower abdominal tenderness). There is no rebound and no guarding.  Musculoskeletal: He exhibits no edema.  Midline spine tenderness, no midline spine step-off, no muscular back tenderness, 5/5 strength bilateral quads, hamstrings, plantar flexion, and dorsiflexion, sensation to light touch intact bilateral lower extremities  Neurological: He is alert.  Skin: Skin is warm and dry. He is not diaphoretic.     Assessment/Plan: Please see individual problem list.  COPD, severe ([place]) Chronic issue.  Seems to be relatively stable with the exception of his back pain limiting his ability  to take deep breaths.  We will evaluate his back pain.  He will continue his current treatment for COPD.  Back pain Patient has had continued issues with back pain over the last month.  Unlikely urologic source.  We will recheck his urine.  We will check an x-ray to evaluate for bony pathology.  I suspect this is musculoskeletal.  He will monitor at this time.  Given return precautions.  Diarrhea Intermittent issues with this alternating with constipation.  Some slight lower abdominal tenderness.  He has good bowel sounds and is passing some gas thus I do not think he is obstructed.  We will check urine studies to evaluate for UTI as cause of abdominal discomfort.  We will check CMP and CBC.   Consider imaging if significantly elevated WBC.  Will need to consider stool studies as well if this continues.   Orders Placed This Encounter  Procedures  . Urine Culture  . DG Lumbar Spine Complete    Standing Status:   Future    Number of Occurrences:   1    Standing Expiration Date:   01/04/2019    Order Specific Question:   Reason for Exam (SYMPTOM  OR DIAGNOSIS REQUIRED)    Answer:   low back pain    Order Specific Question:   Preferred imaging location?    Answer:   [facility] Specific Question:   Radiology Contrast Protocol - do NOT remove file path    Answer:   [email or web address]  . CBC w/Diff  . Comp Met (CMET)  . Urine Microscopic Only  . POCT Urinalysis Dipstick    No orders of the defined types were placed in this encounter.    Tommi Rumps, MD [place]

## 2017-11-06 ENCOUNTER — Telehealth: Payer: Self-pay

## 2017-11-06 ENCOUNTER — Other Ambulatory Visit: Payer: Self-pay | Admitting: Family Medicine

## 2017-11-06 DIAGNOSIS — S32030A Wedge compression fracture of third lumbar vertebra, initial encounter for closed fracture: Secondary | ICD-10-CM

## 2017-11-06 DIAGNOSIS — D72829 Elevated white blood cell count, unspecified: Secondary | ICD-10-CM

## 2017-11-06 DIAGNOSIS — R809 Proteinuria, unspecified: Secondary | ICD-10-CM

## 2017-11-06 NOTE — Telephone Encounter (Signed)
Copied from [place] [phone]. Topic: General - Other >> Nov 06, 2017  2:26 PM Judyann Munson wrote: Reason for CRM: Patient is requesting a call back in regards to labs and xray results on 11-03-17. Please advise

## 2017-11-07 ENCOUNTER — Other Ambulatory Visit: Payer: Self-pay | Admitting: Family Medicine

## 2017-11-07 DIAGNOSIS — R1030 Lower abdominal pain, unspecified: Secondary | ICD-10-CM

## 2017-11-08 ENCOUNTER — Telehealth: Payer: Self-pay

## 2017-11-08 NOTE — Telephone Encounter (Signed)
Spoke with pt and informed him that his sample of auro was at the front desk that he could pick them up at his convenience

## 2017-11-11 DIAGNOSIS — J9610 Chronic respiratory failure, unspecified whether with hypoxia or hypercapnia: Secondary | ICD-10-CM | POA: Diagnosis not present

## 2017-11-11 DIAGNOSIS — R0602 Shortness of breath: Secondary | ICD-10-CM | POA: Diagnosis not present

## 2017-11-11 DIAGNOSIS — J449 Chronic obstructive pulmonary disease, unspecified: Secondary | ICD-10-CM | POA: Diagnosis not present

## 2017-11-13 ENCOUNTER — Telehealth: Payer: Self-pay | Admitting: Family Medicine

## 2017-11-13 NOTE — Telephone Encounter (Signed)
Copied from [place] [phone]. Topic: General - Other >> Nov 13, 2017 12:20 PM Sheran Luz wrote: Reason for CRM: Pt called requesting to speak with someone regarding his xrays. Pt stated that he would like to know which ones he needs to have and why.  CB#[phone]

## 2017-11-13 NOTE — Telephone Encounter (Signed)
Tried calling, no voicemail. [place] for pec to speak to patient and inform him that he is having an abdominal xray done for stomach pain

## 2017-11-13 NOTE — Telephone Encounter (Signed)
X ray results

## 2017-11-13 NOTE — Telephone Encounter (Signed)
Called patient back regarding xrays. Pt stated that he is not having abdominal pain but having lower back pain. And he wants to know if an xray can be done on his back. He thinks it is a bad vertebra or maybe a pinched nerve. He is requesting a call back.

## 2017-11-14 NOTE — Telephone Encounter (Signed)
Patient notified, patient spoke with [facility] about appointment

## 2017-11-14 NOTE — Telephone Encounter (Signed)
Referral sent to [facility] in [place]. I had been declined. I called [facility] to find out why. They state that they called him to schedule but he kept hanging up on them and said that he was going to keep hanging up the longer they call. I went ahead and scheduled him with Dr. Sharyne Richters (back specialist) for Sept. 5. I tried calling him, but no answer and no vm picked up. CRM created with appt information.

## 2017-11-14 NOTE — Telephone Encounter (Signed)
Please ensure the patient did not pass out and make sure he is not having any new symptoms from his fall or new symptoms that caused his fall. Please reinforce that he should be evaluated today. Thanks.

## 2017-11-14 NOTE — Telephone Encounter (Signed)
Please advise 

## 2017-11-14 NOTE — Telephone Encounter (Signed)
Patient notified, patient states he just had a fall and hit his mouth. He states he tripped on the carpet. I informed patient he will need to be evaluated today since he already had a compression fracture. Patient verbalized understanding and states he will get evaluated for fall. He states he did not hit his head.

## 2017-11-14 NOTE — Telephone Encounter (Signed)
Noted.  I am glad he is not having abdominal discomfort anymore.  We did an x-ray of his back previously and it revealed a compression fracture.  I have referred him to orthopedics for evaluation and management of this issue.  I will forward this message to [facility] to see if she can check into his referral.  Thanks.

## 2017-11-14 NOTE — Telephone Encounter (Signed)
Noted. Thank you for advising him to get evaluated today for his more recent fall. Please attempt to contact him regarding his appointment with orthopedics. Thanks.

## 2017-11-14 NOTE — Telephone Encounter (Signed)
Spoke to pt-he states that "he will give me a call back because he was about to go lay down and take a nap" Also stated that he was not going to go anywhere today to get evaluated from his fall.

## 2017-11-14 NOTE — Telephone Encounter (Signed)
Noted  

## 2017-11-14 NOTE — Telephone Encounter (Signed)
Patient states he did not pass out, he states his back hurts slightly more but not much. Informed patient he needs to be evaluated. He states he does not have a ride. Informed him to be evaluated right away if he develops new symptoms.

## 2017-11-15 ENCOUNTER — Telehealth: Payer: Self-pay | Admitting: Radiology

## 2017-11-15 ENCOUNTER — Encounter: Payer: Self-pay | Admitting: Emergency Medicine

## 2017-11-15 ENCOUNTER — Emergency Department
Admission: EM | Admit: 2017-11-15 | Discharge: 2017-11-15 | Disposition: A | Discharge status: Home / Self Care | Payer: Medicare Other | Attending: Emergency Medicine | Admitting: Emergency Medicine

## 2017-11-15 ENCOUNTER — Other Ambulatory Visit: Payer: Self-pay

## 2017-11-15 DIAGNOSIS — M79631 Pain in right forearm: Secondary | ICD-10-CM

## 2017-11-15 DIAGNOSIS — Y939 Activity, unspecified: Secondary | ICD-10-CM | POA: Insufficient documentation

## 2017-11-15 DIAGNOSIS — M549 Dorsalgia, unspecified: Secondary | ICD-10-CM | POA: Diagnosis not present

## 2017-11-15 DIAGNOSIS — Y92008 Other place in unspecified non-institutional (private) residence as the place of occurrence of the external cause: Secondary | ICD-10-CM | POA: Diagnosis not present

## 2017-11-15 DIAGNOSIS — Y999 Unspecified external cause status: Secondary | ICD-10-CM | POA: Insufficient documentation

## 2017-11-15 DIAGNOSIS — M545 Low back pain, unspecified: Secondary | ICD-10-CM

## 2017-11-15 DIAGNOSIS — M25571 Pain in right ankle and joints of right foot: Secondary | ICD-10-CM | POA: Insufficient documentation

## 2017-11-15 DIAGNOSIS — I2510 Atherosclerotic heart disease of native coronary artery without angina pectoris: Secondary | ICD-10-CM | POA: Diagnosis not present

## 2017-11-15 DIAGNOSIS — W010XXA Fall on same level from slipping, tripping and stumbling without subsequent striking against object, initial encounter: Secondary | ICD-10-CM | POA: Diagnosis not present

## 2017-11-15 DIAGNOSIS — Z87891 Personal history of nicotine dependence: Secondary | ICD-10-CM | POA: Diagnosis not present

## 2017-11-15 DIAGNOSIS — S3992XA Unspecified injury of lower back, initial encounter: Secondary | ICD-10-CM | POA: Diagnosis present

## 2017-11-15 DIAGNOSIS — I10 Essential (primary) hypertension: Secondary | ICD-10-CM | POA: Diagnosis not present

## 2017-11-15 DIAGNOSIS — M25562 Pain in left knee: Secondary | ICD-10-CM | POA: Diagnosis not present

## 2017-11-15 DIAGNOSIS — J449 Chronic obstructive pulmonary disease, unspecified: Secondary | ICD-10-CM | POA: Diagnosis not present

## 2017-11-15 DIAGNOSIS — R0602 Shortness of breath: Secondary | ICD-10-CM | POA: Diagnosis not present

## 2017-11-15 DIAGNOSIS — W19XXXA Unspecified fall, initial encounter: Secondary | ICD-10-CM

## 2017-11-15 MED ORDER — ACETAMINOPHEN 500 MG PO TABS
1000.0000 mg | ORAL_TABLET | Freq: Once | ORAL | Status: AC
  Administered 2017-11-15: 1000 mg via ORAL
  Filled 2017-11-15: qty 2

## 2017-11-15 MED ORDER — TRAMADOL HCL 50 MG PO TABS
50.0000 mg | ORAL_TABLET | Freq: Once | ORAL | Status: AC
  Administered 2017-11-15: 50 mg via ORAL
  Filled 2017-11-15: qty 1

## 2017-11-15 NOTE — Discharge Instructions (Signed)
You may continue to take Tylenol and tramadol for your back pain.  You may also continue to use your heating pad as this seems to be helping.  Please use your walker or cane at all times or walking in her home to prevent falls.  Return to the emergency department for severe pain, changes in bladder or bowel function, numbness tingling or weakness, inability to walk, or any other symptoms concerning to you.

## 2017-11-15 NOTE — ED Notes (Signed)
Patient given phone to call family for ride. States he will have to have someone bring his oxygen when they pick him up.

## 2017-11-15 NOTE — ED Provider Notes (Signed)
[facility] Emergency Department Provider Note  ____________________________________________  Time seen: Approximately 1:58 PM  I have reviewed the triage vital signs and the nursing notes.   HISTORY  Chief Complaint Back Pain and Shortness of Breath    HPI Peter Johnston is a 77 y.o. male history of COPD on 3.5 L nasal cannula, chronic back pain, presenting for back pain.  The patient reports that yesterday he got his foot caught under an area rug and fell onto carpet in his home.  He was unable to stand by himself, and had to crawl to some furniture to be able to assist himself up.  Since then, his chronic left lower back pain has been worse.  It feels significantly better when he takes tramadol, Tylenol and uses a heating pad, which is how he usually treats acute on chronic back pain.  He also reports mild mid forearm pain, left knee pain, and right ankle pain, which he attributes to crawling.  He denies any saddle anesthesia, new numbness or tingling, fecal or urinary incontinence or retention.  Today, the patient has been able to ambulate short distances within his home.  He called EMS for ongoing pain, but upon their arrival they noted that he was having shortness of breath although he was maintaining oxygen saturations of greater than 92% on his home O2.  He was given a duoneb and albuterol in route.  The patient denies that he has any new symptoms in regards to his breathing, including shortness of breath, cough, fever, chest pain.  Past Medical History:  Diagnosis Date  . Arthritis   . Asthma   . COPD (chronic obstructive pulmonary disease) ([place])   . Hypertension   . IDA (iron deficiency anemia)   . Lung mass     Patient Active Problem List   Diagnosis Date Noted  . Weakness of both lower extremities 08/02/2017  . Interstitial lung disease ([place]) 04/25/2017  . Iron deficiency anemia due to chronic blood loss 10/17/2016  . Back pain 10/09/2016  . Lower  extremity edema 10/09/2016  . Hyperlipidemia 03/03/2016  . Primary cancer of left upper lobe of lung ([place]) 11/09/2015  . Insomnia 10/16/2015  . Rheumatoid arthritis ([place]) 08/17/2015  . OP (osteoporosis) 08/17/2015  . Gout 08/17/2015  . Glaucoma 08/17/2015  . Coronary artery disease 07/22/2014  . Diarrhea 12/06/2013  . Lumbar canal stenosis 08/28/2013  . GERD (gastroesophageal reflux disease) 06/29/2011  . Iron deficiency anemia 06/29/2011  . Essential hypertension 06/07/2011  . Benign prostatic hypertrophy with urinary frequency 06/07/2011  . COPD, severe ([place]) 06/07/2011    Past Surgical History:  Procedure Laterality Date  . ABDOMINAL HERNIA REPAIR  1995   Dr. Rochel Brome  . [place]  2011, 2012   x 2- Dr. Elvina Mattes    Current Outpatient Rx  . Order #: [id number] Class: Normal  . Order #: [id number] Class: Historical Med  . Order #: [id number] Class: Normal  . Order #: [id number] Class: Historical Med  . Order #: [id number] Class: Historical Med  . Order #: [id number] Class: Normal  . Order #: [id number] Class: Historical Med  . Order #: [id number] Class: Normal  . Order #: [id number] Class: Historical Med  . Order #: [id number] Class: Normal  . Order #: [id number] Class: Normal  . Order #: [id number] Class: Normal  . Order #: [id number] Class: Normal  . Order #: [id number] Class: Historical Med  . Order #: [id number] Class: Historical Med  . Order #: [id number] Class: Normal    Allergies Tamsulosin; Cefdinir; Gold-containing drug products; Hydrochlorothiazide; and  Sulfa antibiotics  Family History  Problem Relation Age of Onset  . Stroke Father   . Heart disease Father   . Hypertension Father   . Heart disease Brother 51    Social History Social History   Tobacco Use  . Smoking status: Former Smoker    Packs/day: 2.00    Years: 63.00    Pack years: 126.00    Types: Cigarettes    Last attempt to quit: 09/13/2015    Years since quitting: 2.1  . Smokeless tobacco: Never Used   . Tobacco comment: Stopped smoking cigarettes Sunday and uses the vapor e-cig to help with cravings  Substance Use Topics  . Alcohol use: Yes    Alcohol/week: 0.0 standard drinks    Comment: 2-3 beers at night  . Drug use: No    Review of Systems Constitutional: No fever/chills.  No lightheadedness or syncope.  Positive mechanical fall.  No loss of consciousness. Eyes: No visual changes.  No blurred or double vision. ENT: No sore throat. No congestion or rhinorrhea. Cardiovascular: Denies chest pain. Denies palpitations. Respiratory: Positive chronic unchanged shortness of breath.  No cough. Gastrointestinal: No abdominal pain.  No nausea, no vomiting.  No diarrhea.  No constipation. Genitourinary: Negative for dysuria. Musculoskeletal: Positive for acute on chronic left lower back pain.  Positive for right mid forearm pain.  Positive right ankle pain.  Positive left knee pain Skin: Negative for rash. Neurological: Negative for headaches. No focal numbness, tingling or weakness.  No fecal or urinary incontinence or retention.    ____________________________________________   PHYSICAL EXAM:  VITAL SIGNS: ED Triage Vitals  Enc Vitals Group     BP 11/15/17 1343 118/70     Pulse Rate 11/15/17 1343 89     Resp 11/15/17 1343 (!) 31     Temp 11/15/17 1343 98.5 F (36.9 C)     Temp Source 11/15/17 1343 Oral     SpO2 11/15/17 1343 92 %     Weight 11/15/17 1345 160 lb (72.6 kg)     Height 11/15/17 1345 5\' 8"  (1.727 m)     Head Circumference --      Peak Flow --      Pain Score 11/15/17 1344 9     Pain Loc --      Pain Edu? --      Excl. in [place]? --     Constitutional: Patient is alert and oriented and able to answer questions appropriately he is chronically ill-appearing. Eyes: Conjunctivae are normal.  EOMI. No scleral icterus.  No eye discharge.  No raccoon eyes. Head: Atraumatic.  No battle sign. Nose: No congestion/rhinnorhea.  Swelling over the nose or septal hematoma  peer mouth/Throat: Mucous membranes are moist.  No dental injury or malocclusion. Neck: No stridor.  Supple.  Mild JVD.  no midline C-spine tenderness to palpation, step-offs or deformities. Cardiovascular: Normal rate, regular rhythm. No murmurs, rubs or gallops.  Respiratory: The patient is tachypneic with accessory muscle use and retractions.  He has poor airflow bilaterally.  He is maintaining oxygen saturations of 93% or greater on his home 3.5 L nasal cannula on my exam.  He has no evidence of wheezing, rales or rhonchi. Gastrointestinal: Soft, nontender and nondistended.  No guarding or rebound.  No peritoneal signs. Musculoskeletal: No LE edema. No ttp in the calves or palpable cords.  Negative Homan's sign.  Elvis is stable.  The patient has full range of motion of the bilateral shoulders, elbows and wrist  without pain.  There is no evidence of new swelling or ecchymosis or deformity in the mid right forearm.  In addition, the patient has full range of motion of the bilateral ankles, knees, and hips without pain.  There is no evidence of acute ecchymosis or swelling in the extremities.  No midline C, T or L-spine tenderness to palpation, step-offs or deformities.  The patient does have some reproducible left lower back pain without any overlying abnormalities in the skin; no ecchymosis or swelling.  Neurologic:  A&Ox3.  Speech is clear.  Face and smile are symmetric.  EOMI.  Moves all extremities well.  Motor sensation to light touch in the bilateral lower extremities. Skin:  Skin is warm, dry. Psychiatric: Mood and affect are normal. Speech and behavior are normal.  Normal judgement.  ____________________________________________   LABS (all labs ordered are listed, but only abnormal results are displayed)  Labs Reviewed - No data to display ____________________________________________  EKG  ED ECG REPORT I, Eula Listen, the attending physician, personally viewed and  interpreted this ECG.   Date: 11/15/2017  EKG Time: 1349  Rate: 81  Rhythm: normal sinus rhythm; + PVC; + RBBB  Axis: leftward  Intervals:none  ST&T Change: No STEMI  ____________________________________________  RADIOLOGY  No results found.  ____________________________________________   PROCEDURES  Procedure(s) performed: None  Procedures  Critical Care performed: No ____________________________________________   INITIAL IMPRESSION / ASSESSMENT AND PLAN / ED COURSE  Pertinent labs & imaging results that were available during my care of the patient were reviewed by me and considered in my medical decision making (see chart for details).  77 y.o. male  with a history of COPD and chronic back pain presenting for acute on chronic back pain after fall yesterday.  Overall, the patient does have signs of respiratory abnormality, which are most likely chronic for him.  In addition, he is maintaining oxygen saturations at a normal rate on his normal O2.  At this time, there is no indication for additional work-up of his respiratory status.  In regards to his back pain, he likely has an acute on chronic back pain.  I will treat him with tramadol and Tylenol since this has been working for him at home.  There is no evidence for spinal cord compression or cauda equina today.  The patient has several other complaints in his extremities, none of which demonstrate any overt injury or suspicion for fracture.  I anticipate the patient will be able to be discharged home with close PMD follow-up and return precautions were discussed.  ____________________________________________  FINAL CLINICAL IMPRESSION(S) / ED DIAGNOSES  Final diagnoses:  Fall, initial encounter  Acute left-sided low back pain without sciatica  Acute right ankle pain  Acute pain of left knee  Right forearm pain         NEW MEDICATIONS STARTED DURING THIS VISIT:  New Prescriptions   No medications on file       Eula Listen, MD 11/15/17 1409

## 2017-11-15 NOTE — ED Triage Notes (Addendum)
Patient from home via [facility]. Reports he has had left lower back pain for approximately 6 months. Patient reports being seen by PCP approximately 2 weeks ago and they checked urine, blood and an xray of his back that showed nothing. Patient reports that when he twists a certain way, he has increased urgency to urinate or have bowel movement. Patient states he has fracture of L5 that he has had injections in his back for. Patient also states he tripped on a rug and fell yesterday, and is sore today. Patient also complaining of some SOB. States he has COPD but since this morning has had slightly increased SOB. Given 1 duoneb and 1 albuterol treatment by EMS. Patient denies cough or recent fever or illness. Patient speaking in complete sentence.

## 2017-11-15 NOTE — Telephone Encounter (Signed)
Copy of CD placed up front in folder for pt to pick up.

## 2017-11-16 ENCOUNTER — Telehealth: Payer: Self-pay

## 2017-11-16 NOTE — Telephone Encounter (Signed)
fyi

## 2017-11-16 NOTE — Telephone Encounter (Signed)
Copied from [place] [id number]. Topic: Inquiry >> Nov 16, 2017  2:32 PM Vernona Rieger wrote: Reason for CRM: Patient wanted to let Dr Caryl Bis know that he fell yesterday and hit his head on the coffee table. He went to the emergency room and was discharged home.

## 2017-11-17 NOTE — Telephone Encounter (Signed)
Noted  

## 2017-11-20 ENCOUNTER — Other Ambulatory Visit (INDEPENDENT_AMBULATORY_CARE_PROVIDER_SITE_OTHER): Payer: Medicare Other

## 2017-11-20 DIAGNOSIS — R809 Proteinuria, unspecified: Secondary | ICD-10-CM | POA: Diagnosis not present

## 2017-11-20 LAB — POCT URINALYSIS DIPSTICK
Bilirubin, UA: NEGATIVE
GLUCOSE UA: NEGATIVE
KETONES UA: NEGATIVE
LEUKOCYTES UA: NEGATIVE
NITRITE UA: NEGATIVE
Protein, UA: NEGATIVE
RBC UA: NEGATIVE
SPEC GRAV UA: 1.015 (ref 1.010–1.025)
Urobilinogen, UA: 0.2 E.U./dL
pH, UA: 8.5 — AB (ref 5.0–8.0)

## 2017-11-28 ENCOUNTER — Telehealth: Payer: Self-pay

## 2017-11-28 NOTE — Telephone Encounter (Signed)
Pt called wanting Anora samples, informed pt I left 3 at the front desk for pick up.

## 2017-12-08 ENCOUNTER — Other Ambulatory Visit: Payer: Self-pay | Admitting: Family Medicine

## 2017-12-08 ENCOUNTER — Other Ambulatory Visit: Payer: Self-pay | Admitting: Internal Medicine

## 2017-12-12 DIAGNOSIS — J449 Chronic obstructive pulmonary disease, unspecified: Secondary | ICD-10-CM | POA: Diagnosis not present

## 2017-12-12 DIAGNOSIS — R0602 Shortness of breath: Secondary | ICD-10-CM | POA: Diagnosis not present

## 2017-12-12 DIAGNOSIS — J9610 Chronic respiratory failure, unspecified whether with hypoxia or hypercapnia: Secondary | ICD-10-CM | POA: Diagnosis not present

## 2017-12-21 ENCOUNTER — Telehealth: Payer: Self-pay

## 2017-12-21 NOTE — Telephone Encounter (Signed)
Pt advised samples ready for pickup for anoro

## 2017-12-22 ENCOUNTER — Ambulatory Visit: Payer: Medicare Other

## 2017-12-22 ENCOUNTER — Other Ambulatory Visit: Payer: Medicare Other

## 2017-12-22 ENCOUNTER — Ambulatory Visit: Payer: Medicare Other | Admitting: Internal Medicine

## 2017-12-25 ENCOUNTER — Other Ambulatory Visit: Payer: Self-pay

## 2017-12-25 ENCOUNTER — Encounter: Payer: Self-pay | Admitting: Internal Medicine

## 2017-12-25 ENCOUNTER — Inpatient Hospital Stay: Payer: Medicare Other | Attending: Internal Medicine

## 2017-12-25 ENCOUNTER — Inpatient Hospital Stay (HOSPITAL_BASED_OUTPATIENT_CLINIC_OR_DEPARTMENT_OTHER): Payer: Medicare Other | Admitting: Internal Medicine

## 2017-12-25 ENCOUNTER — Inpatient Hospital Stay: Payer: Medicare Other

## 2017-12-25 VITALS — BP 125/76 | HR 87 | Temp 97.6°F | Resp 22 | Ht 68.0 in | Wt 155.0 lb

## 2017-12-25 DIAGNOSIS — M549 Dorsalgia, unspecified: Secondary | ICD-10-CM

## 2017-12-25 DIAGNOSIS — M129 Arthropathy, unspecified: Secondary | ICD-10-CM

## 2017-12-25 DIAGNOSIS — J449 Chronic obstructive pulmonary disease, unspecified: Secondary | ICD-10-CM

## 2017-12-25 DIAGNOSIS — R5383 Other fatigue: Secondary | ICD-10-CM | POA: Diagnosis not present

## 2017-12-25 DIAGNOSIS — R531 Weakness: Secondary | ICD-10-CM | POA: Diagnosis not present

## 2017-12-25 DIAGNOSIS — C3412 Malignant neoplasm of upper lobe, left bronchus or lung: Secondary | ICD-10-CM

## 2017-12-25 DIAGNOSIS — I10 Essential (primary) hypertension: Secondary | ICD-10-CM | POA: Insufficient documentation

## 2017-12-25 DIAGNOSIS — Z79899 Other long term (current) drug therapy: Secondary | ICD-10-CM

## 2017-12-25 DIAGNOSIS — D509 Iron deficiency anemia, unspecified: Secondary | ICD-10-CM | POA: Insufficient documentation

## 2017-12-25 DIAGNOSIS — G8929 Other chronic pain: Secondary | ICD-10-CM | POA: Diagnosis not present

## 2017-12-25 DIAGNOSIS — F17210 Nicotine dependence, cigarettes, uncomplicated: Secondary | ICD-10-CM

## 2017-12-25 LAB — COMPREHENSIVE METABOLIC PANEL
ALT: 16 U/L (ref 0–44)
AST: 24 U/L (ref 15–41)
Albumin: 3.4 g/dL — ABNORMAL LOW (ref 3.5–5.0)
Alkaline Phosphatase: 98 U/L (ref 38–126)
Anion gap: 9 (ref 5–15)
BILIRUBIN TOTAL: 0.8 mg/dL (ref 0.3–1.2)
BUN: 17 mg/dL (ref 8–23)
CALCIUM: 9.4 mg/dL (ref 8.9–10.3)
CO2: 30 mmol/L (ref 22–32)
Chloride: 101 mmol/L (ref 98–111)
Creatinine, Ser: 0.72 mg/dL (ref 0.61–1.24)
GFR calc non Af Amer: >60 mL/min (ref >60)
Glucose, Bld: 120 mg/dL — ABNORMAL HIGH (ref 70–99)
Potassium: 4.1 mmol/L (ref 3.5–5.1)
Sodium: 140 mmol/L (ref 135–145)
TOTAL PROTEIN: 7.1 g/dL (ref 6.5–8.1)

## 2017-12-25 LAB — CBC WITH DIFFERENTIAL/PLATELET
BASOS ABS: 0.1 10*3/uL (ref 0–0.1)
BASOS PCT: 0 %
EOS ABS: 0.1 10*3/uL (ref 0–0.7)
Eosinophils Relative: 1 %
HCT: 39.4 % — ABNORMAL LOW (ref 40.0–52.0)
HEMOGLOBIN: 12.7 g/dL — AB (ref 13.0–18.0)
Lymphocytes Relative: 7 %
Lymphs Abs: 1 10*3/uL (ref 1.0–3.6)
MCH: 29.5 pg (ref 26.0–34.0)
MCHC: 32.3 g/dL (ref 32.0–36.0)
MCV: 91.4 fL (ref 80.0–100.0)
Monocytes Absolute: 0.9 10*3/uL (ref 0.2–1.0)
Monocytes Relative: 6 %
NEUTROS PCT: 86 %
Neutro Abs: 11.9 10*3/uL — ABNORMAL HIGH (ref 1.4–6.5)
Platelets: 241 10*3/uL (ref 150–440)
RBC: 4.31 MIL/uL — ABNORMAL LOW (ref 4.40–5.90)
RDW: 17 % — ABNORMAL HIGH (ref 11.5–14.5)
WBC: 14 10*3/uL — ABNORMAL HIGH (ref 3.8–10.6)

## 2017-12-25 LAB — IRON AND TIBC
Iron: 47 ug/dL (ref 45–182)
Saturation Ratios: 17 % — ABNORMAL LOW (ref 17.9–39.5)
TIBC: 274 ug/dL (ref 250–450)
UIBC: 227 ug/dL

## 2017-12-25 LAB — FERRITIN: Ferritin: 86 ng/mL (ref 24–336)

## 2017-12-25 NOTE — Assessment & Plan Note (Addendum)
# #   LEFT UPPER LOBE LUNG CA- STAGE I-status post SBRT June 2017.  June 2019-improved lung nodule; lung nodules.  Clinically STABLE.   #Iron deficiency anemia unclear etiology; today hemoglobin is 12.7 saturation 16%; recommend holding off any IV iron.  Stable.  #Chronic severe COPD-stable; with worsening cough/phlegm STABLE.  #Chronic fatigue-likely second advance COPD. STABLE.   #Follow-up in 3 months labs; possible Venofer; CT chest few days

## 2017-12-25 NOTE — Progress Notes (Signed)
[place] OFFICE PROGRESS NOTE  Patient Care Team: Leone Haven, MD as PCP - General (Family Medicine) Allyne Gee, MD (Pulmonary Disease) Lollie Sails, MD as Consulting Physician (Gastroenterology)  Cancer Staging No matching staging information was found for the patient.   Oncology History   # April 2016-LUL LUNG CA [clinical; no Bx sec to high risk] LUL LUNG NODULE [36mm]; MAY 2017- 40mm; PET avid- no distant mets s/p SBRT [end of June 2017];   # NOV 22nd CT scan- Improved LUL nodule  #Iron deficiency anemia-September 2018 [poor candidate for EGD colonoscopy; Dr.Skulskie] ? Gastritis vs AV malformation  # COPD- home O2 [Dr.Saadat Khan] -------------------------------------------------------------   DIAGNOSIS: Left upper lobe non-small cell lung cancer   STAGE:  I       ;GOALS: Cure/control  CURRENT/MOST RECENT THERAPY: Surveillance.      Primary cancer of left upper lobe of lung ([place])   11/09/2015 Initial Diagnosis    Primary cancer of left upper lobe of lung ([place])       INTERVAL HISTORY:  Peter Johnston 77 y.o.  male pleasant patient above history of stage I lung cancer status post SBRT; iron deficiency anemia is here for follow-up and review the CAT scan.  Patient continues to complain of chronic shortness of breath chronic cough.  Denies any constipation.  Mild loose stools.  Chronic fatigue.  No nausea no vomiting.  No abdominal pain.  Chronic back pain.  Denies any blood in stools or black or stools.  Review of Systems  Constitutional: Positive for malaise/fatigue. Negative for chills, diaphoresis, fever and weight loss.  HENT: Negative for nosebleeds and sore throat.   Eyes: Negative for double vision.  Respiratory: Positive for cough, sputum production, shortness of breath and wheezing. Negative for hemoptysis.   Cardiovascular: Negative for chest pain, palpitations, orthopnea and leg swelling.  Gastrointestinal: Negative for  abdominal pain, blood in stool, constipation, diarrhea, heartburn, melena, nausea and vomiting.  Genitourinary: Negative for dysuria, frequency and urgency.  Musculoskeletal: Positive for back pain. Negative for joint pain.  Skin: Negative.  Negative for itching and rash.  Neurological: Negative for dizziness, tingling, focal weakness, weakness and headaches.  Endo/Heme/Allergies: Does not bruise/bleed easily.  Psychiatric/Behavioral: Negative for depression. The patient is not nervous/anxious and does not have insomnia.       PAST MEDICAL HISTORY :  Past Medical History:  Diagnosis Date  . Arthritis   . Asthma   . COPD (chronic obstructive pulmonary disease) ([place])   . Hypertension   . IDA (iron deficiency anemia)   . Lung mass     PAST SURGICAL HISTORY :   Past Surgical History:  Procedure Laterality Date  . ABDOMINAL HERNIA REPAIR  1995   Dr. Rochel Brome  . HAMMER TOE SURGERY  2011, 2012   x 2- Dr. Elvina Mattes    FAMILY HISTORY :   Family History  Problem Relation Age of Onset  . Stroke Father   . Heart disease Father   . Hypertension Father   . Heart disease Brother 84    SOCIAL HISTORY:   Social History   Tobacco Use  . Smoking status: Former Smoker    Packs/day: 2.00    Years: 63.00    Pack years: 126.00    Types: Cigarettes    Last attempt to quit: 09/13/2015    Years since quitting: 2.2  . Smokeless tobacco: Never Used  . Tobacco comment: Stopped smoking cigarettes Sunday and uses the vapor  e-cig to help with cravings  Substance Use Topics  . Alcohol use: Yes    Alcohol/week: 0.0 standard drinks    Comment: 2-3 beers at night  . Drug use: No    ALLERGIES:  is allergic to tamsulosin; cefdinir; gold-containing drug products; hydrochlorothiazide; and sulfa antibiotics.  MEDICATIONS:  Current Outpatient Medications  Medication Sig Dispense Refill  . albuterol (PROVENTIL HFA;VENTOLIN HFA) 108 (90 Base) MCG/ACT inhaler Inhale 2 puffs into the lungs every 6  (six) hours as needed for wheezing or shortness of breath. 1 Inhaler 2  . allopurinol (ZYLOPRIM) 300 MG tablet Take 300 mg by mouth daily.    Marland Kitchen atenolol (TENORMIN) 25 MG tablet TAKE 1 TABLET DAILY 90 tablet 3  . brimonidine (ALPHAGAN) 0.2 % ophthalmic solution Place 1 drop into both eyes 2 (two) times daily.    . ENBREL 50 MG/ML injection 50 mg once a week.     . fenofibrate (TRICOR) 145 MG tablet Take 1 tablet (145 mg total) by mouth daily. 90 tablet 3  . furosemide (LASIX) 20 MG tablet Take 1 tablet by mouth as needed for edema.    Marland Kitchen ipratropium-albuterol (DUONEB) 0.5-2.5 (3) MG/3ML SOLN Inhale 3 mLs into the lungs every 4 (four) hours as needed. 90 mL 3  . latanoprost (XALATAN) 0.005 % ophthalmic solution Place 1 drop into both eyes at bedtime.    . pantoprazole (PROTONIX) 40 MG tablet TAKE 1 TABLET BY MOUTH 2  TIMES DAILY BEFORE A MEAL. 180 tablet 0  . predniSONE (DELTASONE) 10 MG tablet Take 1 tablet (10 mg total) by mouth daily with breakfast. 90 tablet 0  . ranitidine (ZANTAC) 150 MG tablet TAKE 1 TABLET BY MOUTH TWO  TIMES DAILY 180 tablet 1  . theophylline (UNIPHYL) 400 MG 24 hr tablet TAKE 1 TABLET BY MOUTH  DAILY 60 tablet 2  . traMADol (ULTRAM) 50 MG tablet Take 50 mg by mouth daily.    . TRAVATAN Z 0.004 % SOLN ophthalmic solution Place 1 drop into both eyes at bedtime.     Marland Kitchen umeclidinium-vilanterol (ANORO ELLIPTA) 62.5-25 MCG/INH AEPB Inhale 1 puff daily 3 each 1   No current facility-administered medications for this visit.     PHYSICAL EXAMINATION: ECOG PERFORMANCE STATUS: 2 - Symptomatic, <50% confined to bed  BP 125/76 (BP Location: Left Arm, Patient Position: Sitting)   Pulse 87   Temp 97.6 F (36.4 C) (Tympanic)   Resp (!) 22   Ht 5\' 8"  (1.727 m)   Wt 155 lb (70.3 kg)   SpO2 (!) 87%   BMI 23.57 kg/m   Filed Weights   12/25/17 1408  Weight: 155 lb (70.3 kg)    Physical Exam  Constitutional: He is oriented to person, place, and time.   Accompanied by his  wife he is in a wheelchair; home O2.  He appears frail.  HENT:  Head: Normocephalic and atraumatic.  Mouth/Throat: Oropharynx is clear and moist. No oropharyngeal exudate.  Eyes: Pupils are equal, round, and reactive to light.  Neck: Normal range of motion. Neck supple.  Cardiovascular: Normal rate and regular rhythm.  Pulmonary/Chest: No respiratory distress. He has no wheezes.  Decreased air entry bilaterally.    Abdominal: Soft. Bowel sounds are normal. He exhibits no distension and no mass. There is no tenderness. There is no rebound and no guarding.  Musculoskeletal: Normal range of motion. He exhibits no edema or tenderness.  Neurological: He is alert and oriented to person, place, and time.  Skin: Skin  is warm.  Psychiatric: Affect normal.      LABORATORY DATA:  I have reviewed the data as listed    Component Value Date/Time   NA 140 12/25/2017 1329   NA 139 02/01/2012 0446   K 4.1 12/25/2017 1329   K 4.5 02/01/2012 0446   CL 101 12/25/2017 1329   CL 105 02/01/2012 0446   CO2 30 12/25/2017 1329   CO2 29 02/01/2012 0446   GLUCOSE 120 (H) 12/25/2017 1329   GLUCOSE 127 (H) 02/01/2012 0446   BUN 17 12/25/2017 1329   BUN 18 02/01/2012 0446   CREATININE 0.72 12/25/2017 1329   CREATININE 0.87 11/03/2017 1532   CALCIUM 9.4 12/25/2017 1329   CALCIUM 9.2 02/01/2012 0446   PROT 7.1 12/25/2017 1329   PROT 8.2 01/30/2012 1859   ALBUMIN 3.4 (L) 12/25/2017 1329   ALBUMIN 2.8 (L) 01/30/2012 1859   AST 24 12/25/2017 1329   AST 17 01/30/2012 1859   ALT 16 12/25/2017 1329   ALT 17 01/30/2012 1859   ALKPHOS 98 12/25/2017 1329   ALKPHOS 81 01/30/2012 1859   BILITOT 0.8 12/25/2017 1329   BILITOT 0.4 01/30/2012 1859   GFRNONAA >60 12/25/2017 1329   GFRNONAA >60 02/01/2012 0446   GFRAA >60 12/25/2017 1329   GFRAA >60 02/01/2012 0446    No results found for: SPEP, UPEP  Lab Results  Component Value Date   WBC 14.0 (H) 12/25/2017   NEUTROABS 11.9 (H) 12/25/2017   HGB 12.7  (L) 12/25/2017   HCT 39.4 (L) 12/25/2017   MCV 91.4 12/25/2017   PLT 241 12/25/2017      Chemistry      Component Value Date/Time   NA 140 12/25/2017 1329   NA 139 02/01/2012 0446   K 4.1 12/25/2017 1329   K 4.5 02/01/2012 0446   CL 101 12/25/2017 1329   CL 105 02/01/2012 0446   CO2 30 12/25/2017 1329   CO2 29 02/01/2012 0446   BUN 17 12/25/2017 1329   BUN 18 02/01/2012 0446   CREATININE 0.72 12/25/2017 1329   CREATININE 0.87 11/03/2017 1532      Component Value Date/Time   CALCIUM 9.4 12/25/2017 1329   CALCIUM 9.2 02/01/2012 0446   ALKPHOS 98 12/25/2017 1329   ALKPHOS 81 01/30/2012 1859   AST 24 12/25/2017 1329   AST 17 01/30/2012 1859   ALT 16 12/25/2017 1329   ALT 17 01/30/2012 1859   BILITOT 0.8 12/25/2017 1329   BILITOT 0.4 01/30/2012 1859       RADIOGRAPHIC STUDIES: I have personally reviewed the radiological images as listed and agreed with the findings in the report. No results found.   ASSESSMENT & PLAN:  Primary cancer of left upper lobe of lung ([place]) # # LEFT UPPER LOBE LUNG CA- STAGE I-status post SBRT June 2017.  June 2019-improved lung nodule; lung nodules.  Clinically STABLE.   #Iron deficiency anemia unclear etiology; today hemoglobin is 12.7 saturation 16%; recommend holding off any IV iron.  Stable.  #Chronic severe COPD-stable; with worsening cough/phlegm STABLE.  #Chronic fatigue-likely second advance COPD. STABLE.   #Follow-up in 3 months labs; possible Venofer; CT chest few days   Orders Placed This Encounter  Procedures  . CT CHEST WO CONTRAST    Standing Status:   Future    Standing Expiration Date:   12/26/2018    Order Specific Question:   Preferred imaging location?    Answer:    [facility]    Order Specific Question:  Radiology Contrast Protocol - do NOT remove file path    Answer:   [email or web address]    Order Specific Question:   ** REASON FOR EXAM (FREE TEXT)    Answer:   lung cancer   All  questions were answered. The patient knows to call the clinic with any problems, questions or concerns.      Cammie Sickle, MD 12/25/2017 2:43 PM

## 2017-12-27 DIAGNOSIS — H401112 Primary open-angle glaucoma, right eye, moderate stage: Secondary | ICD-10-CM | POA: Diagnosis not present

## 2017-12-30 ENCOUNTER — Other Ambulatory Visit: Payer: Self-pay | Admitting: Internal Medicine

## 2018-01-01 ENCOUNTER — Other Ambulatory Visit: Payer: Self-pay

## 2018-01-01 ENCOUNTER — Emergency Department: Payer: Medicare Other

## 2018-01-01 ENCOUNTER — Observation Stay
Admission: EM | Admit: 2018-01-01 | Discharge: 2018-01-03 | Disposition: A | Discharge status: Home Health | Payer: Medicare Other | Attending: Internal Medicine | Admitting: Internal Medicine

## 2018-01-01 DIAGNOSIS — M069 Rheumatoid arthritis, unspecified: Secondary | ICD-10-CM | POA: Insufficient documentation

## 2018-01-01 DIAGNOSIS — Z881 Allergy status to other antibiotic agents status: Secondary | ICD-10-CM | POA: Diagnosis not present

## 2018-01-01 DIAGNOSIS — M1990 Unspecified osteoarthritis, unspecified site: Secondary | ICD-10-CM | POA: Diagnosis not present

## 2018-01-01 DIAGNOSIS — Z8711 Personal history of peptic ulcer disease: Secondary | ICD-10-CM | POA: Diagnosis not present

## 2018-01-01 DIAGNOSIS — Z85118 Personal history of other malignant neoplasm of bronchus and lung: Secondary | ICD-10-CM | POA: Diagnosis not present

## 2018-01-01 DIAGNOSIS — K922 Gastrointestinal hemorrhage, unspecified: Secondary | ICD-10-CM | POA: Diagnosis not present

## 2018-01-01 DIAGNOSIS — Z9981 Dependence on supplemental oxygen: Secondary | ICD-10-CM | POA: Diagnosis not present

## 2018-01-01 DIAGNOSIS — Z888 Allergy status to other drugs, medicaments and biological substances status: Secondary | ICD-10-CM | POA: Insufficient documentation

## 2018-01-01 DIAGNOSIS — K219 Gastro-esophageal reflux disease without esophagitis: Secondary | ICD-10-CM | POA: Diagnosis not present

## 2018-01-01 DIAGNOSIS — D509 Iron deficiency anemia, unspecified: Secondary | ICD-10-CM | POA: Insufficient documentation

## 2018-01-01 DIAGNOSIS — Z87891 Personal history of nicotine dependence: Secondary | ICD-10-CM | POA: Diagnosis not present

## 2018-01-01 DIAGNOSIS — I10 Essential (primary) hypertension: Secondary | ICD-10-CM | POA: Diagnosis not present

## 2018-01-01 DIAGNOSIS — K5730 Diverticulosis of large intestine without perforation or abscess without bleeding: Secondary | ICD-10-CM | POA: Insufficient documentation

## 2018-01-01 DIAGNOSIS — Z8249 Family history of ischemic heart disease and other diseases of the circulatory system: Secondary | ICD-10-CM | POA: Insufficient documentation

## 2018-01-01 DIAGNOSIS — K625 Hemorrhage of anus and rectum: Secondary | ICD-10-CM | POA: Diagnosis not present

## 2018-01-01 DIAGNOSIS — J439 Emphysema, unspecified: Secondary | ICD-10-CM | POA: Insufficient documentation

## 2018-01-01 DIAGNOSIS — M810 Age-related osteoporosis without current pathological fracture: Secondary | ICD-10-CM | POA: Insufficient documentation

## 2018-01-01 DIAGNOSIS — D649 Anemia, unspecified: Secondary | ICD-10-CM | POA: Diagnosis not present

## 2018-01-01 DIAGNOSIS — K8020 Calculus of gallbladder without cholecystitis without obstruction: Secondary | ICD-10-CM | POA: Diagnosis not present

## 2018-01-01 DIAGNOSIS — J449 Chronic obstructive pulmonary disease, unspecified: Secondary | ICD-10-CM | POA: Diagnosis not present

## 2018-01-01 DIAGNOSIS — Z79899 Other long term (current) drug therapy: Secondary | ICD-10-CM | POA: Insufficient documentation

## 2018-01-01 DIAGNOSIS — E876 Hypokalemia: Secondary | ICD-10-CM | POA: Insufficient documentation

## 2018-01-01 DIAGNOSIS — Z882 Allergy status to sulfonamides status: Secondary | ICD-10-CM | POA: Diagnosis not present

## 2018-01-01 DIAGNOSIS — K921 Melena: Secondary | ICD-10-CM | POA: Diagnosis not present

## 2018-01-01 DIAGNOSIS — R1030 Lower abdominal pain, unspecified: Secondary | ICD-10-CM | POA: Diagnosis not present

## 2018-01-01 DIAGNOSIS — I700 Atherosclerosis of aorta: Secondary | ICD-10-CM | POA: Diagnosis not present

## 2018-01-01 LAB — COMPREHENSIVE METABOLIC PANEL
ALK PHOS: 91 U/L (ref 38–126)
ALT: 15 U/L (ref 0–44)
AST: 21 U/L (ref 15–41)
Albumin: 3.2 g/dL — ABNORMAL LOW (ref 3.5–5.0)
Anion gap: 7 (ref 5–15)
BUN: 18 mg/dL (ref 8–23)
CALCIUM: 8.7 mg/dL — AB (ref 8.9–10.3)
CO2: 36 mmol/L — ABNORMAL HIGH (ref 22–32)
CREATININE: 0.74 mg/dL (ref 0.61–1.24)
Chloride: 97 mmol/L — ABNORMAL LOW (ref 98–111)
GFR calc non Af Amer: >60 mL/min (ref >60)
Glucose, Bld: 123 mg/dL — ABNORMAL HIGH (ref 70–99)
Potassium: 3.2 mmol/L — ABNORMAL LOW (ref 3.5–5.1)
SODIUM: 140 mmol/L (ref 135–145)
Total Bilirubin: 0.7 mg/dL (ref 0.3–1.2)
Total Protein: 6.4 g/dL — ABNORMAL LOW (ref 6.5–8.1)

## 2018-01-01 LAB — TYPE AND SCREEN
ABO/RH(D): O POS
Antibody Screen: NEGATIVE

## 2018-01-01 LAB — HEMOGLOBIN AND HEMATOCRIT, BLOOD
HCT: 36.8 % — ABNORMAL LOW (ref 40.0–52.0)
HCT: 35.4 % — ABNORMAL LOW (ref 40.0–52.0)
HEMOGLOBIN: 12.2 g/dL — AB (ref 13.0–18.0)
Hemoglobin: 11.5 g/dL — ABNORMAL LOW (ref 13.0–18.0)

## 2018-01-01 LAB — CBC
HCT: 37.5 % — ABNORMAL LOW (ref 40.0–52.0)
Hemoglobin: 11.9 g/dL — ABNORMAL LOW (ref 13.0–18.0)
MCH: 29.5 pg (ref 26.0–34.0)
MCHC: 31.8 g/dL — AB (ref 32.0–36.0)
MCV: 92.8 fL (ref 80.0–100.0)
PLATELETS: 215 10*3/uL (ref 150–440)
RBC: 4.04 MIL/uL — AB (ref 4.40–5.90)
RDW: 16.9 % — ABNORMAL HIGH (ref 11.5–14.5)
WBC: 11.6 10*3/uL — ABNORMAL HIGH (ref 3.8–10.6)

## 2018-01-01 LAB — TROPONIN I

## 2018-01-01 MED ORDER — ACETAMINOPHEN 325 MG PO TABS
650.0000 mg | ORAL_TABLET | Freq: Four times a day (QID) | ORAL | Status: DC | PRN
  Administered 2018-01-01 – 2018-01-03 (×3): 650 mg via ORAL
  Filled 2018-01-01 (×3): qty 2

## 2018-01-01 MED ORDER — ORAL CARE MOUTH RINSE
15.0000 mL | Freq: Two times a day (BID) | OROMUCOSAL | Status: DC
  Administered 2018-01-01 – 2018-01-02 (×2): 15 mL via OROMUCOSAL

## 2018-01-01 MED ORDER — SODIUM CHLORIDE 0.9 % IV SOLN
INTRAVENOUS | Status: DC
  Administered 2018-01-01 – 2018-01-02 (×3): via INTRAVENOUS

## 2018-01-01 MED ORDER — SODIUM CHLORIDE 0.9 % IV SOLN
8.0000 mg/h | INTRAVENOUS | Status: DC
  Administered 2018-01-01 – 2018-01-02 (×3): 8 mg/h via INTRAVENOUS
  Filled 2018-01-01 (×3): qty 80

## 2018-01-01 MED ORDER — ALBUTEROL SULFATE (2.5 MG/3ML) 0.083% IN NEBU
3.0000 mL | INHALATION_SOLUTION | Freq: Four times a day (QID) | RESPIRATORY_TRACT | Status: DC | PRN
  Administered 2018-01-02 (×2): 3 mL via RESPIRATORY_TRACT
  Filled 2018-01-01 (×2): qty 3

## 2018-01-01 MED ORDER — POTASSIUM CHLORIDE CRYS ER 20 MEQ PO TBCR
40.0000 meq | EXTENDED_RELEASE_TABLET | Freq: Once | ORAL | Status: AC
  Administered 2018-01-01: 40 meq via ORAL
  Filled 2018-01-01: qty 2

## 2018-01-01 MED ORDER — GUAIFENESIN-DM 100-10 MG/5ML PO SYRP
5.0000 mL | ORAL_SOLUTION | ORAL | Status: DC | PRN
  Administered 2018-01-01 – 2018-01-02 (×2): 5 mL via ORAL
  Filled 2018-01-01 (×2): qty 5

## 2018-01-01 MED ORDER — IPRATROPIUM-ALBUTEROL 0.5-2.5 (3) MG/3ML IN SOLN
3.0000 mL | RESPIRATORY_TRACT | Status: DC | PRN
  Administered 2018-01-01: 3 mL via RESPIRATORY_TRACT
  Filled 2018-01-01: qty 3

## 2018-01-01 MED ORDER — SODIUM CHLORIDE 0.9 % IV BOLUS
500.0000 mL | Freq: Once | INTRAVENOUS | Status: AC
  Administered 2018-01-01: 500 mL via INTRAVENOUS

## 2018-01-01 MED ORDER — IOPAMIDOL (ISOVUE-300) INJECTION 61%
100.0000 mL | Freq: Once | INTRAVENOUS | Status: AC | PRN
  Administered 2018-01-01: 100 mL via INTRAVENOUS

## 2018-01-01 MED ORDER — ONDANSETRON HCL 4 MG/2ML IJ SOLN: 4.0000 mg | Freq: Four times a day (QID) | INTRAMUSCULAR | Status: DC | PRN

## 2018-01-01 MED ORDER — ACETAMINOPHEN 650 MG RE SUPP: 650.0000 mg | Freq: Four times a day (QID) | RECTAL | Status: DC | PRN

## 2018-01-01 MED ORDER — LATANOPROST 0.005 % OP SOLN
1.0000 [drp] | Freq: Every day | OPHTHALMIC | Status: DC
  Administered 2018-01-01 – 2018-01-02 (×2): 1 [drp] via OPHTHALMIC
  Filled 2018-01-01: qty 2.5

## 2018-01-01 MED ORDER — ONDANSETRON HCL 4 MG PO TABS: 4.0000 mg | ORAL_TABLET | Freq: Four times a day (QID) | ORAL | Status: DC | PRN

## 2018-01-01 MED ORDER — IOPAMIDOL (ISOVUE-300) INJECTION 61%
30.0000 mL | Freq: Once | INTRAVENOUS | Status: AC | PRN
  Administered 2018-01-01: 30 mL via ORAL

## 2018-01-01 NOTE — ED Triage Notes (Signed)
Pt arrived via EMS from home c/o bright red rectal bleeding x 2 days, lower abd pain, N/V/D

## 2018-01-01 NOTE — ED Provider Notes (Signed)
[facility] Emergency Department Provider Note ____________________________________________   I have reviewed the triage vital signs and the triage nursing note.  HISTORY  Chief Complaint Rectal Bleeding   Historian Patient  HPI Peter Johnston is a 77 y.o. male wears home O2 for history of COPD, presents with sounds like may be 2 to 3 days of watery diarrhea, over the last 24 hours with bright red blood per rectum with the diarrhea.  No fever.  Reports nausea without vomiting.  Positive for lower abdominal pain.  History of appendectomy.  Panel her abdomen is moderate.  States he took a tramadol at home and it feels okay her mild right now.  No new dose of breath, he has underlying shortness of breath which is chronic.     Past Medical History:  Diagnosis Date  . Arthritis   . Asthma   . COPD (chronic obstructive pulmonary disease) ([place])   . Hypertension   . IDA (iron deficiency anemia)   . Lung mass     Patient Active Problem List   Diagnosis Date Noted  . Weakness of both lower extremities 08/02/2017  . Interstitial lung disease ([place]) 04/25/2017  . Iron deficiency anemia due to chronic blood loss 10/17/2016  . Back pain 10/09/2016  . Lower extremity edema 10/09/2016  . Hyperlipidemia 03/03/2016  . Primary cancer of left upper lobe of lung ([place]) 11/09/2015  . Insomnia 10/16/2015  . Rheumatoid arthritis ([place]) 08/17/2015  . OP (osteoporosis) 08/17/2015  . Gout 08/17/2015  . Glaucoma 08/17/2015  . Coronary artery disease 07/22/2014  . Diarrhea 12/06/2013  . Lumbar canal stenosis 08/28/2013  . GERD (gastroesophageal reflux disease) 06/29/2011  . Iron deficiency anemia 06/29/2011  . Essential hypertension 06/07/2011  . Benign prostatic hypertrophy with urinary frequency 06/07/2011  . COPD, severe ([place]) 06/07/2011    Past Surgical History:  Procedure Laterality Date  . ABDOMINAL HERNIA REPAIR  1995   Dr. Rochel Brome  . HAMMER TOE SURGERY   2011, 2012   x 2- Dr. Elvina Mattes    Prior to Admission medications   Medication Sig Start Date End Date Taking? Authorizing Provider  acetaminophen (TYLENOL) 325 MG tablet Take 650 mg by mouth 2 (two) times daily as needed for moderate pain.   Yes [provider]  albuterol (PROVENTIL HFA;VENTOLIN HFA) 108 (90 Base) MCG/ACT inhaler Inhale 2 puffs into the lungs every 6 (six) hours as needed for wheezing or shortness of breath. 12/05/16  Yes Cammie Sickle, MD  allopurinol (ZYLOPRIM) 300 MG tablet Take 300 mg by mouth daily.   Yes [provider]  atenolol (TENORMIN) 25 MG tablet TAKE 1 TABLET DAILY Patient taking differently: Take 25 mg by mouth daily.  01/09/17  Yes Leone Haven, MD  bimatoprost (LUMIGAN) 0.01 % SOLN Place 1 drop into both eyes 2 (two) times daily.   Yes [provider]  etanercept (ENBREL) 50 MG/ML injection Inject 50 mg into the skin every Wednesday.   Yes [provider]  furosemide (LASIX) 20 MG tablet Take 1 tablet by mouth daily as needed for edema.    Yes [provider]  ipratropium-albuterol (DUONEB) 0.5-2.5 (3) MG/3ML SOLN Inhale 3 mLs into the lungs every 4 (four) hours as needed. Patient taking differently: Inhale 3 mLs into the lungs every 4 (four) hours as needed (for wheezing/shortness of breath).  11/30/16  Yes Cook, Jayce G, DO  predniSONE (DELTASONE) 10 MG tablet TAKE 1 TABLET BY MOUTH  DAILY WITH BREAKFAST  Patient taking differently: Take 10 mg by mouth daily.  01/01/18  Yes Allyne Gee, MD  theophylline (UNIPHYL) 400 MG 24 hr tablet TAKE 1 TABLET BY MOUTH  DAILY Patient taking differently: Take 400 mg by mouth daily.  10/02/17  Yes Allyne Gee, MD  traMADol (ULTRAM) 50 MG tablet Take 50 mg by mouth daily. 06/15/17  Yes [provider]  Travoprost, BAK Free, (TRAVATAN) 0.004 % SOLN ophthalmic solution Place 1 drop into both eyes at bedtime.   Yes [provider]  umeclidinium-vilanterol  (ANORO ELLIPTA) 62.5-25 MCG/INH AEPB Inhale 1 puff daily Patient taking differently: Inhale 1 puff into the lungs daily.  05/29/17  Yes Allyne Gee, MD  fenofibrate (TRICOR) 145 MG tablet Take 1 tablet (145 mg total) by mouth daily. Patient not taking: Reported on 01/01/2018 04/15/17   Leone Haven, MD  pantoprazole (PROTONIX) 40 MG tablet TAKE 1 TABLET BY MOUTH 2  TIMES DAILY BEFORE A MEAL. Patient not taking: Reported on 01/01/2018 12/11/17   Cammie Sickle, MD    Allergies  Allergen Reactions  . Tamsulosin Shortness Of Breath and Other (See Comments)    dizziness  . Cefdinir Other (See Comments)    SOB  . Gold-Containing Drug Products Other (See Comments)    Proteinuria  . Hydrochlorothiazide Other (See Comments)    BP dropped  . Sulfa Antibiotics Nausea Only    Family History  Problem Relation Age of Onset  . Stroke Father   . Heart disease Father   . Hypertension Father   . Heart disease Brother 66    Social History Social History   Tobacco Use  . Smoking status: Former Smoker    Packs/day: 2.00    Years: 63.00    Pack years: 126.00    Types: Cigarettes    Last attempt to quit: 09/13/2015    Years since quitting: 2.3  . Smokeless tobacco: Never Used  . Tobacco comment: Stopped smoking cigarettes Sunday and uses the vapor e-cig to help with cravings  Substance Use Topics  . Alcohol use: Yes    Alcohol/week: 0.0 standard drinks    Comment: 2-3 beers at night  . Drug use: No    Review of Systems  Constitutional: Negative for fever. Eyes: Negative for visual changes. ENT: Negative for sore throat. Cardiovascular: Negative for chest pain. Respiratory: Positive for chronic shortness of breath. Gastrointestinal: Positive as per HPI for lower abdominal pain and diarrhea.. Genitourinary: Negative for dysuria. Musculoskeletal: Negative for back pain. Skin: Negative for rash. Neurological: Negative for  headache.  ____________________________________________   PHYSICAL EXAM:  VITAL SIGNS: ED Triage Vitals  Enc Vitals Group     BP 01/01/18 1222 118/81     Pulse Rate 01/01/18 1222 89     Resp 01/01/18 1222 20     Temp 01/01/18 1222 99 F (37.2 C)     Temp Source 01/01/18 1222 Oral     SpO2 01/01/18 1221 97 %     Weight 01/01/18 1223 160 lb (72.6 kg)     Height 01/01/18 1223 5\' 8"  (1.727 m)     Head Circumference --      Peak Flow --      Pain Score 01/01/18 1223 7     Pain Loc --      Pain Edu? --      Excl. in [place]? --      Constitutional: Alert and oriented.  HEENT      Head: Normocephalic and  atraumatic.      Eyes: Conjunctivae are normal. Pupils equal and round.       Ears:         Nose: No congestion/rhinnorhea.      Mouth/Throat: Mucous membranes are moist.      Neck: No stridor. Cardiovascular/Chest: Normal rate, regular rhythm.  No murmurs, rubs, or gallops. Respiratory: Normal respiratory effort without tachypnea nor retractions. Breath sounds are clear and equal bilaterally. No wheezes/rales/rhonchi. Gastrointestinal: Soft. No distention, no guarding, no rebound.  Moderate tenderness in lower abdomen without focal point tenderness. Genitourinary/rectal: No external hemorrhoids.  Nontender rectal exam.  No stool, but secretions are heme positive Musculoskeletal: Nontender with normal range of motion in all extremities. No joint effusions.  No lower extremity tenderness.  No edema. Neurologic:  Normal speech and language. No gross or focal neurologic deficits are appreciated. Skin:  Skin is warm, dry and intact. No rash noted. Psychiatric: Mood and affect are normal. Speech and behavior are normal. Patient exhibits appropriate insight and judgment.   ____________________________________________  LABS (pertinent positives/negatives) I, Lisa Roca, MD the attending physician have reviewed the labs noted below.  Labs Reviewed  COMPREHENSIVE METABOLIC PANEL -  Abnormal; Notable for the following components:      Result Value   Potassium 3.2 (*)    Chloride 97 (*)    CO2 36 (*)    Glucose, Bld 123 (*)    Calcium 8.7 (*)    Total Protein 6.4 (*)    Albumin 3.2 (*)    All other components within normal limits  CBC - Abnormal; Notable for the following components:   WBC 11.6 (*)    RBC 4.04 (*)    Hemoglobin 11.9 (*)    HCT 37.5 (*)    MCHC 31.8 (*)    RDW 16.9 (*)    All other components within normal limits  TROPONIN I  TYPE AND SCREEN    ____________________________________________    EKG I, Lisa Roca, MD, the attending physician have personally viewed and interpreted all ECGs.  EKG number 1.  83 bpm.  Sinus arrhythmia.  Right bundle branch block.  Nonspecific T wave. EKG #2.  80 bpm.  Sinus rhythm with PACs, occasional PVC.  Right bundle branch block.  Nonspecific T wave. ____________________________________________  RADIOLOGY  Ct abd pel:    IMPRESSION: Cholelithiasis.  Sigmoid diverticulosis is noted. Irregular density is seen posterior to sigmoid colon in area of prior diverticulitis most consistent with postinflammatory scarring. No definite acute abnormality is seen currently.  Aortic Atherosclerosis (ICD10-I70.0). __________________________________________  PROCEDURES  Procedure(s) performed: None  Procedures  Critical Care performed: None   ____________________________________________  ED COURSE / ASSESSMENT AND PLAN  Pertinent labs & imaging results that were available during my care of the patient were reviewed by me and considered in my medical decision making (see chart for details).    No hypotension or fever.  Patient is reporting bright red blood per rectum, none here so far, but secretions were heme positive from rectal exam.  He is wearing home oxygen, no new or worsening shortness of breath or hypoxia.    WBC slightly elevated, but previously slight elevated, uncertain significance.   Hg 11.9, similar to prior.  CT abd without cause of abd pain.  Most likely clinically an enteritis with irritant bleeding, however given patients report of large amount of bright red blood per rectum, and age, will discuss with hospitalist for observation for stability with the rectal bleeding.  Suspected lower  gib.   CONSULTATIONS: Hospitalist for admission.   Patient / Family / Caregiver informed of clinical course, medical decision-making process, and agree with plan.    ___________________________________________   FINAL CLINICAL IMPRESSION(S) / ED DIAGNOSES   Final diagnoses:  Rectal bleeding      ___________________________________________         Note: This dictation was prepared with Dragon dictation. Any transcriptional errors that result from this process are unintentional    Lisa Roca, MD 01/01/18 [phone]

## 2018-01-01 NOTE — Progress Notes (Signed)
Advanced care plan.  Purpose of the Encounter: CODE STATUS  Parties in Attendance: Patient  Patient's Decision Capacity: Good  Subjective/Patient's story: Presented to emergency room for rectal bleed   Objective/Medical story Has acute gastrointestinal bleeding and needs GI work-up   Goals of care determination:   Advance care directives and goals of care and treatment plan discussed Patient wants everything done which includes CPR, intubation the need arises  CODE STATUS: Full code  Time spent discussing advanced care planning: 16 minutes

## 2018-01-01 NOTE — H&P (Addendum)
[place] [facility] [place] NAME: Peter Johnston    MR#:  [id number]  DATE OF BIRTH:  1940-05-07  DATE OF ADMISSION:  01/01/2018  PRIMARY CARE PHYSICIAN: Leone Haven, MD   REQUESTING/REFERRING PHYSICIAN:   CHIEF COMPLAINT:   Chief Complaint  Patient presents with  . Rectal Bleeding    HISTORY OF PRESENT ILLNESS: Peter Johnston  is a 77 y.o. male with a known history of COPD, hypertension, lung mass, iron deficiency anemia, hiatal hernia in the past presented to the emergency room with rectal bleed.  Patient had a couple of episodes of rectal bleed.  He is on home oxygen.  He was evaluated in the emergency room with a CT abdomen which showed sigmoid diverticulosis and cholelithiasis.  No complaints of any abdominal pain.  No vomiting of blood.  Hospitalist service was consulted for further care.  Hemoglobin is greater than 11.  PAST MEDICAL HISTORY:   Past Medical History:  Diagnosis Date  . Arthritis   . Asthma   . COPD (chronic obstructive pulmonary disease) ([place])   . Hypertension   . IDA (iron deficiency anemia)   . Lung mass     PAST SURGICAL HISTORY:  Past Surgical History:  Procedure Laterality Date  . ABDOMINAL HERNIA REPAIR  1995   Dr. Rochel Brome  . [place]  2011, 2012   x 2- Dr. Elvina Mattes    SOCIAL HISTORY:  Social History   Tobacco Use  . Smoking status: Former Smoker    Packs/day: 2.00    Years: 63.00    Pack years: 126.00    Types: Cigarettes    Last attempt to quit: 09/13/2015    Years since quitting: 2.3  . Smokeless tobacco: Never Used  . Tobacco comment: Stopped smoking cigarettes Sunday and uses the vapor e-cig to help with cravings  Substance Use Topics  . Alcohol use: Yes    Alcohol/week: 0.0 standard drinks    Comment: 2-3 beers at night    FAMILY HISTORY:  Family History  Problem Relation Age of Onset  . Stroke Father   . Heart disease Father   . Hypertension Father   . Heart  disease Brother 82    DRUG ALLERGIES:  Allergies  Allergen Reactions  . Tamsulosin Shortness Of Breath and Other (See Comments)    dizziness  . Cefdinir Other (See Comments)    SOB  . Gold-Containing Drug Products Other (See Comments)    Proteinuria  . Hydrochlorothiazide Other (See Comments)    BP dropped  . Sulfa Antibiotics Nausea Only    REVIEW OF SYSTEMS:   CONSTITUTIONAL: No fever, fatigue or weakness.  EYES: No blurred or double vision.  EARS, NOSE, AND THROAT: No tinnitus or ear pain.  RESPIRATORY: No cough, shortness of breath, wheezing or hemoptysis.  CARDIOVASCULAR: No chest pain, orthopnea, edema.  GASTROINTESTINAL: No nausea, vomiting, diarrhea or abdominal pain.  Has rectal bleed GENITOURINARY: No dysuria, hematuria.  ENDOCRINE: No polyuria, nocturia,  HEMATOLOGY: No anemia, easy bruising or bleeding SKIN: No rash or lesion. MUSCULOSKELETAL: No joint pain or arthritis.   NEUROLOGIC: No tingling, numbness, weakness.  PSYCHIATRY: No anxiety or depression.   MEDICATIONS AT HOME:  Prior to Admission medications   Medication Sig Start Date End Date Taking? Authorizing Provider  acetaminophen (TYLENOL) 325 MG tablet Take 650 mg by mouth 2 (two) times daily as needed for moderate pain.   Yes [provider]  albuterol (PROVENTIL HFA;VENTOLIN  HFA) 108 (90 Base) MCG/ACT inhaler Inhale 2 puffs into the lungs every 6 (six) hours as needed for wheezing or shortness of breath. 12/05/16  Yes Cammie Sickle, MD  allopurinol (ZYLOPRIM) 300 MG tablet Take 300 mg by mouth daily.   Yes [provider]  atenolol (TENORMIN) 25 MG tablet TAKE 1 TABLET DAILY Patient taking differently: Take 25 mg by mouth daily.  01/09/17  Yes Leone Haven, MD  bimatoprost (LUMIGAN) 0.01 % SOLN Place 1 drop into both eyes 2 (two) times daily.   Yes [provider]  etanercept (ENBREL) 50 MG/ML injection Inject 50 mg into the skin every Wednesday.   Yes  [provider]  furosemide (LASIX) 20 MG tablet Take 1 tablet by mouth daily as needed for edema.    Yes [provider]  ipratropium-albuterol (DUONEB) 0.5-2.5 (3) MG/3ML SOLN Inhale 3 mLs into the lungs every 4 (four) hours as needed. Patient taking differently: Inhale 3 mLs into the lungs every 4 (four) hours as needed (for wheezing/shortness of breath).  11/30/16  Yes Cook, Jayce G, DO  predniSONE (DELTASONE) 10 MG tablet TAKE 1 TABLET BY MOUTH  DAILY WITH BREAKFAST Patient taking differently: Take 10 mg by mouth daily.  01/01/18  Yes Allyne Gee, MD  theophylline (UNIPHYL) 400 MG 24 hr tablet TAKE 1 TABLET BY MOUTH  DAILY Patient taking differently: Take 400 mg by mouth daily.  10/02/17  Yes Allyne Gee, MD  traMADol (ULTRAM) 50 MG tablet Take 50 mg by mouth daily. 06/15/17  Yes [provider]  Travoprost, BAK Free, (TRAVATAN) 0.004 % SOLN ophthalmic solution Place 1 drop into both eyes at bedtime.   Yes [provider]  umeclidinium-vilanterol (ANORO ELLIPTA) 62.5-25 MCG/INH AEPB Inhale 1 puff daily Patient taking differently: Inhale 1 puff into the lungs daily.  05/29/17  Yes Allyne Gee, MD  fenofibrate (TRICOR) 145 MG tablet Take 1 tablet (145 mg total) by mouth daily. Patient not taking: Reported on 01/01/2018 04/15/17   Leone Haven, MD  pantoprazole (PROTONIX) 40 MG tablet TAKE 1 TABLET BY MOUTH 2  TIMES DAILY BEFORE A MEAL. Patient not taking: Reported on 01/01/2018 12/11/17   Cammie Sickle, MD      PHYSICAL EXAMINATION:   VITAL SIGNS: Blood pressure 118/81, pulse 89, temperature 99 F (37.2 C), temperature source Oral, resp. rate 20, height 5\' 8"  (1.727 m), weight 72.6 kg, SpO2 97 %.  GENERAL:  77 y.o.-year-old patient lying in the bed with no acute distress.  EYES: Pupils equal, round, reactive to light and accommodation. No scleral icterus. Extraocular muscles intact.  HEENT: Head atraumatic, normocephalic. Oropharynx dry and  nasopharynx clear.  NECK:  Supple, no jugular venous distention. No thyroid enlargement, no tenderness.  LUNGS: Normal breath sounds bilaterally, no wheezing, rales,rhonchi or crepitation. No use of accessory muscles of respiration.  CARDIOVASCULAR: S1, S2 normal. No murmurs, rubs, or gallops.  ABDOMEN: Soft, nontender, nondistended. Bowel sounds present. No organomegaly or mass.  EXTREMITIES: No pedal edema, cyanosis, or clubbing.  NEUROLOGIC: Cranial nerves II through XII are intact. Muscle strength 5/5 in all extremities. Sensation intact. Gait not checked.  PSYCHIATRIC: The patient is alert and oriented x 3.  SKIN: No obvious rash, lesion, or ulcer.   LABORATORY PANEL:   CBC Recent Labs  Lab 01/01/18 1229  WBC 11.6*  HGB 11.9*  HCT 37.5*  PLT 215  MCV 92.8  MCH 29.5  MCHC 31.8*  RDW 16.9*   ------------------------------------------------------------------------------------------------------------------  Chemistries  Recent Labs  Lab 01/01/18 1229  NA 140  K 3.2*  CL 97*  CO2 36*  GLUCOSE 123*  BUN 18  CREATININE 0.74  CALCIUM 8.7*  AST 21  ALT 15  ALKPHOS 91  BILITOT 0.7   ------------------------------------------------------------------------------------------------------------------ estimated creatinine clearance is 73.6 mL/min (by C-G formula based on SCr of 0.74 mg/dL). ------------------------------------------------------------------------------------------------------------------ No results for input(s): TSH, T4TOTAL, T3FREE, THYROIDAB in the last 72 hours.  Invalid input(s): FREET3   Coagulation profile No results for input(s): INR, PROTIME in the last 168 hours. ------------------------------------------------------------------------------------------------------------------- No results for input(s): DDIMER in the last 72  hours. -------------------------------------------------------------------------------------------------------------------  Cardiac Enzymes Recent Labs  Lab 01/01/18 1229  TROPONINI <0.03   ------------------------------------------------------------------------------------------------------------------ Invalid input(s): POCBNP  ---------------------------------------------------------------------------------------------------------------  Urinalysis    Component Value Date/Time   COLORURINE YELLOW (A) 11/04/2016 1600   APPEARANCEUR HAZY (A) 11/04/2016 1600   LABSPEC 1.019 11/04/2016 1600   PHURINE 7.0 11/04/2016 1600   GLUCOSEU NEGATIVE 11/04/2016 1600   HGBUR NEGATIVE 11/04/2016 1600   BILIRUBINUR negative 11/20/2017 1412   KETONESUR NEGATIVE 11/04/2016 1600   PROTEINUR Negative 11/20/2017 1412   PROTEINUR NEGATIVE 11/04/2016 1600   UROBILINOGEN 0.2 11/20/2017 1412   NITRITE negative 11/20/2017 1412   NITRITE NEGATIVE 11/04/2016 1600   LEUKOCYTESUR Negative 11/20/2017 1412     RADIOLOGY: Ct Abdomen Pelvis W Contrast  Result Date: 01/01/2018 CLINICAL DATA:  Lower abdominal pain, rectal bleeding. EXAM: CT ABDOMEN AND PELVIS WITH CONTRAST TECHNIQUE: Multidetector CT imaging of the abdomen and pelvis was performed using the standard protocol following bolus administration of intravenous contrast. CONTRAST:  153mL ISOVUE-300 IOPAMIDOL (ISOVUE-300) INJECTION 61% COMPARISON:  CT scan of December 06, 2016. FINDINGS: Lower chest: No acute abnormality. Hepatobiliary: Mild cholelithiasis is noted without inflammation. Liver is unremarkable. No biliary dilatation is noted. Pancreas: Unremarkable. No pancreatic ductal dilatation or surrounding inflammatory changes. Spleen: Normal in size without focal abnormality. Adrenals/Urinary Tract: Adrenal glands are unremarkable. Kidneys are normal, without renal calculi, focal lesion, or hydronephrosis. Bladder is unremarkable. Stomach/Bowel: The  stomach and appendix appear normal. There is no evidence of bowel obstruction. Sigmoid diverticulosis is noted. Irregular density is seen posterior to sigmoid colon in area of prior diverticulitis most consistent with scarring from prior inflammation. No definite acute inflammation is noted. Vascular/Lymphatic: Aortic atherosclerosis. No enlarged abdominal or pelvic lymph nodes. Reproductive: Prostatic calcifications are noted. Other: No abdominal wall hernia or abnormality. No abdominopelvic ascites. Musculoskeletal: Stable multiple old lumbar compression fractures are noted. IMPRESSION: Cholelithiasis. Sigmoid diverticulosis is noted. Irregular density is seen posterior to sigmoid colon in area of prior diverticulitis most consistent with postinflammatory scarring. No definite acute abnormality is seen currently. Aortic Atherosclerosis (ICD10-I70.0). Electronically Signed   By: Marijo Conception, M.D.   On: 01/01/2018 15:54    EKG: Orders placed or performed during the hospital encounter of 01/01/18  . ED EKG  . ED EKG    IMPRESSION AND PLAN:  77 year old male patient with history of COPD, hypertension, lung mass, iron deficiency anemia, hiatal hernia in the past resented to the emergency room for rectal bleed  -Acute gastrointestinal bleeding Admit patient to medical floor Serial hemoglobin hematocrit monitoring Gastroenterology consultation Clear liquid diet N.p.o. after midnight IV Protonix drip Hold blood thinner medications  -Emphysema Continue home oxygen Home dose inhalers and nebulization treatments  -Iron deficiency anemia Monitor hemoglobin hematocrit Iron supplements  -DVT prophylaxis with sequential compression device to lower extremities  -Hypokalemia Replace potassium  All the records are reviewed and case discussed with  ED provider. Management plans discussed with the patient, family and they are in agreement.  CODE STATUS:Full code Advance Directive  Documentation     Most Recent Value  Type of Advance Directive  Healthcare Power of Attorney, Living will  Pre-existing out of facility DNR order (yellow form or pink MOST form)  -  "MOST" Form in Place?  -       TOTAL TIME TAKING CARE OF THIS PATIENT: 52 minutes.    Saundra Shelling M.D on 01/01/2018 at 4:58 PM  Between 7am to 6pm - Pager - [phone]  After 6pm go to [email or web address] - password [email or web address] [facility]  [place] [facility]  Office  [phone]  CC: Primary care physician; Leone Haven, MD

## 2018-01-01 NOTE — ED Notes (Signed)
Patient transported to CT 

## 2018-01-02 DIAGNOSIS — K921 Melena: Secondary | ICD-10-CM | POA: Diagnosis not present

## 2018-01-02 DIAGNOSIS — K922 Gastrointestinal hemorrhage, unspecified: Secondary | ICD-10-CM | POA: Diagnosis not present

## 2018-01-02 DIAGNOSIS — J449 Chronic obstructive pulmonary disease, unspecified: Secondary | ICD-10-CM | POA: Diagnosis not present

## 2018-01-02 DIAGNOSIS — I10 Essential (primary) hypertension: Secondary | ICD-10-CM | POA: Diagnosis not present

## 2018-01-02 DIAGNOSIS — E876 Hypokalemia: Secondary | ICD-10-CM | POA: Diagnosis not present

## 2018-01-02 LAB — HEMOGLOBIN AND HEMATOCRIT, BLOOD
HCT: 33.7 % — ABNORMAL LOW (ref 39.0–52.0)
HEMATOCRIT: 36 % — AB (ref 40.0–52.0)
HEMATOCRIT: 36.7 % — AB (ref 39.0–52.0)
Hemoglobin: 11.6 g/dL — ABNORMAL LOW (ref 13.0–18.0)
Hemoglobin: 11.6 g/dL — ABNORMAL LOW (ref 13.0–17.0)
Hemoglobin: 11 g/dL — ABNORMAL LOW (ref 13.0–17.0)

## 2018-01-02 LAB — BASIC METABOLIC PANEL
Anion gap: 8 (ref 5–15)
BUN: 17 mg/dL (ref 8–23)
CHLORIDE: 102 mmol/L (ref 98–111)
CO2: 30 mmol/L (ref 22–32)
CREATININE: 0.76 mg/dL (ref 0.61–1.24)
Calcium: 8.3 mg/dL — ABNORMAL LOW (ref 8.9–10.3)
GFR calc non Af Amer: >60 mL/min (ref >60)
Glucose, Bld: 105 mg/dL — ABNORMAL HIGH (ref 70–99)
Potassium: 4.2 mmol/L (ref 3.5–5.1)
Sodium: 140 mmol/L (ref 135–145)

## 2018-01-02 MED ORDER — PANTOPRAZOLE SODIUM 40 MG IV SOLR
40.0000 mg | Freq: Two times a day (BID) | INTRAVENOUS | Status: DC
  Administered 2018-01-02 – 2018-01-03 (×2): 40 mg via INTRAVENOUS
  Filled 2018-01-02 (×2): qty 40

## 2018-01-02 MED ORDER — CHLORHEXIDINE GLUCONATE 0.12 % MT SOLN
15.0000 mL | Freq: Two times a day (BID) | OROMUCOSAL | Status: DC
  Administered 2018-01-02 – 2018-01-03 (×3): 15 mL via OROMUCOSAL
  Filled 2018-01-02 (×3): qty 15

## 2018-01-02 MED ORDER — ALBUTEROL SULFATE (2.5 MG/3ML) 0.083% IN NEBU: 3.0000 mL | INHALATION_SOLUTION | RESPIRATORY_TRACT | Status: DC | PRN

## 2018-01-02 MED ORDER — ORAL CARE MOUTH RINSE
15.0000 mL | Freq: Two times a day (BID) | OROMUCOSAL | Status: DC
  Administered 2018-01-02 (×2): 15 mL via OROMUCOSAL

## 2018-01-02 MED ORDER — IPRATROPIUM-ALBUTEROL 0.5-2.5 (3) MG/3ML IN SOLN
3.0000 mL | Freq: Four times a day (QID) | RESPIRATORY_TRACT | Status: DC
  Administered 2018-01-02 – 2018-01-03 (×4): 3 mL via RESPIRATORY_TRACT
  Filled 2018-01-02 (×4): qty 3

## 2018-01-02 MED ORDER — UMECLIDINIUM-VILANTEROL 62.5-25 MCG/INH IN AEPB
1.0000 | INHALATION_SPRAY | Freq: Every day | RESPIRATORY_TRACT | Status: DC
  Administered 2018-01-02 – 2018-01-03 (×2): 1 via RESPIRATORY_TRACT
  Filled 2018-01-02: qty 14

## 2018-01-02 NOTE — Care Management Obs Status (Signed)
[place] NOTIFICATION   Patient Details  Name: Peter Johnston MRN: [id number] Date of Birth: 18-Mar-1941   Medicare Observation Status Notification Given:  Yes    Beverly Sessions, RN 01/02/2018, 4:08 PM

## 2018-01-02 NOTE — Care Management CC44 (Signed)
Condition Code 44 Documentation Completed  Patient Details  Name: Peter Johnston MRN: [id number] Date of Birth: May 09, 1940   Condition Code 44 given:  Yes Patient signature on Condition Code 44 notice:  Yes Documentation of 2 MD's agreement:  Yes Code 44 added to claim:  Yes    Beverly Sessions, RN 01/02/2018, 4:08 PM

## 2018-01-02 NOTE — Consult Note (Addendum)
Vonda Antigua, MD [address] [place], [place], [place], [address] [address], [place], [place], [place], [address] Phone: [phone]  Fax: [phone]  Consultation  Referring Provider:     Dr. Margaretmary Eddy Primary Care Physician:  Leone Haven, MD Reason for Consultation:     Hematochezia  Date of Admission:  01/01/2018 Date of Consultation:  01/02/2018         HPI:   Peter Johnston is a 77 y.o. male presents with hematochezia that started 1 day ago.  3-4 episodes of bright red blood per rectum.  CT shows sigmoid diverticulosis.  Hemoglobin is around 11.  No hematemesis.  No abdominal pain. Pt reports yellow to brown bowel movements today, with no melena or hematochezia.  Denies any abdominal pain.  Patient had a colonoscopy in 2010 by Dr. Gustavo Lah for heme positive stool that showed diverticulosis.  Also had an EGD at that time procedure report from EGD not available.  Pathology report shows gastritis.  Also reports history of a duodenal ulcer in the 1970s.  Denies having any surgery for this at that time.  Past Medical History:  Diagnosis Date  . Arthritis   . Asthma   . COPD (chronic obstructive pulmonary disease) ([place])   . Hypertension   . IDA (iron deficiency anemia)   . Lung mass     Past Surgical History:  Procedure Laterality Date  . ABDOMINAL HERNIA REPAIR  1995   Dr. Rochel Brome  . HAMMER TOE SURGERY  2011, 2012   x 2- Dr. Elvina Mattes    Prior to Admission medications   Medication Sig Start Date End Date Taking? Authorizing Provider  acetaminophen (TYLENOL) 325 MG tablet Take 650 mg by mouth 2 (two) times daily as needed for moderate pain.   Yes [provider]  albuterol (PROVENTIL HFA;VENTOLIN HFA) 108 (90 Base) MCG/ACT inhaler Inhale 2 puffs into the lungs every 6 (six) hours as needed for wheezing or shortness of breath. 12/05/16  Yes Cammie Sickle, MD  allopurinol (ZYLOPRIM) 300 MG tablet Take 300 mg by mouth daily.   Yes  [provider]  atenolol (TENORMIN) 25 MG tablet TAKE 1 TABLET DAILY Patient taking differently: Take 25 mg by mouth daily.  01/09/17  Yes Leone Haven, MD  bimatoprost (LUMIGAN) 0.01 % SOLN Place 1 drop into both eyes 2 (two) times daily.   Yes [provider]  etanercept (ENBREL) 50 MG/ML injection Inject 50 mg into the skin every Wednesday.   Yes [provider]  furosemide (LASIX) 20 MG tablet Take 1 tablet by mouth daily as needed for edema.    Yes [provider]  ipratropium-albuterol (DUONEB) 0.5-2.5 (3) MG/3ML SOLN Inhale 3 mLs into the lungs every 4 (four) hours as needed. Patient taking differently: Inhale 3 mLs into the lungs every 4 (four) hours as needed (for wheezing/shortness of breath).  11/30/16  Yes Cook, Jayce G, DO  predniSONE (DELTASONE) 10 MG tablet TAKE 1 TABLET BY MOUTH  DAILY WITH BREAKFAST Patient taking differently: Take 10 mg by mouth daily.  01/01/18  Yes Allyne Gee, MD  theophylline (UNIPHYL) 400 MG 24 hr tablet TAKE 1 TABLET BY MOUTH  DAILY Patient taking differently: Take 400 mg by mouth daily.  10/02/17  Yes Allyne Gee, MD  traMADol (ULTRAM) 50 MG tablet Take 50 mg by mouth daily. 06/15/17  Yes [provider]  Travoprost, BAK Free, (TRAVATAN) 0.004 % SOLN ophthalmic solution Place 1 drop  into both eyes at bedtime.   Yes [provider]  umeclidinium-vilanterol (ANORO ELLIPTA) 62.5-25 MCG/INH AEPB Inhale 1 puff daily Patient taking differently: Inhale 1 puff into the lungs daily.  05/29/17  Yes Allyne Gee, MD  fenofibrate (TRICOR) 145 MG tablet Take 1 tablet (145 mg total) by mouth daily. Patient not taking: Reported on 01/01/2018 04/15/17   Leone Haven, MD  pantoprazole (PROTONIX) 40 MG tablet TAKE 1 TABLET BY MOUTH 2  TIMES DAILY BEFORE A MEAL. Patient not taking: Reported on 01/01/2018 12/11/17   Cammie Sickle, MD    Family History  Problem Relation Age of Onset  . Stroke Father     . Heart disease Father   . Hypertension Father   . Heart disease Brother 43     Social History   Tobacco Use  . Smoking status: Former Smoker    Packs/day: 2.00    Years: 63.00    Pack years: 126.00    Types: Cigarettes    Last attempt to quit: 01/22/2014    Years since quitting: 3.9  . Smokeless tobacco: Never Used  . Tobacco comment: Stopped smoking cigarettes Sunday and uses the vapor e-cig to help with cravings  Substance Use Topics  . Alcohol use: Yes    Alcohol/week: 15.0 standard drinks    Types: 15 Standard drinks or equivalent per week    Comment: 2-3 beers at night  . Drug use: No    Allergies as of 01/01/2018 - Review Complete 01/01/2018  Allergen Reaction Noted  . Tamsulosin Shortness Of Breath and Other (See Comments) 07/14/2014  . Cefdinir Other (See Comments) 11/03/2017  . Gold-containing drug products Other (See Comments) 08/17/2015  . Hydrochlorothiazide Other (See Comments) 08/17/2015  . Sulfa antibiotics Nausea Only 06/07/2011    Review of Systems:    All systems reviewed and negative except where noted in HPI.   Physical Exam:  Vital signs in last 24 hours: Vitals:   01/01/18 2119 01/02/18 0359 01/02/18 0900 01/02/18 1051  BP:  131/68    Pulse:  87 64 99  Resp:  (!) 22 18 (!) 22  Temp:  98.4 F (36.9 C)    TempSrc:  Oral    SpO2: 92% 98%  97%  Weight:      Height:       Last BM Date: 01/02/18 General:   Pleasant, cooperative in NAD Head:  Normocephalic and atraumatic. Eyes:   No icterus.   Conjunctiva pink. PERRLA. Ears:  Normal auditory acuity. Neck:  Supple; no masses or thyroidomegaly Lungs: Respirations even and unlabored. Lungs clear to auscultation bilaterally.   No wheezes, crackles, or rhonchi.  Abdomen:  Soft, nondistended, nontender. Normal bowel sounds. No appreciable masses or hepatomegaly.  No rebound or guarding.  Neurologic:  Alert and oriented x3;  grossly normal neurologically. Skin:  Intact without significant lesions  or rashes. Cervical Nodes:  No significant cervical adenopathy. Psych:  Alert and cooperative. Normal affect.  LAB RESULTS: Recent Labs    01/01/18 1229 01/01/18 1746 01/01/18 2304 01/02/18 0504  WBC 11.6*  --   --   --   HGB 11.9* 12.2* 11.5* 11.6*  HCT 37.5* 36.8* 35.4* 36.0*  PLT 215  --   --   --    BMET Recent Labs    01/01/18 1229 01/02/18 0504  NA 140 140  K 3.2* 4.2  CL 97* 102  CO2 36* 30  GLUCOSE 123* 105*  BUN 18 17  CREATININE 0.74  0.76  CALCIUM 8.7* 8.3*   LFT Recent Labs    01/01/18 1229  PROT 6.4*  ALBUMIN 3.2*  AST 21  ALT 15  ALKPHOS 91  BILITOT 0.7   PT/INR No results for input(s): LABPROT, INR in the last 72 hours.  STUDIES: Ct Abdomen Pelvis W Contrast  Result Date: 01/01/2018 CLINICAL DATA:  Lower abdominal pain, rectal bleeding. EXAM: CT ABDOMEN AND PELVIS WITH CONTRAST TECHNIQUE: Multidetector CT imaging of the abdomen and pelvis was performed using the standard protocol following bolus administration of intravenous contrast. CONTRAST:  167mL ISOVUE-300 IOPAMIDOL (ISOVUE-300) INJECTION 61% COMPARISON:  CT scan of December 06, 2016. FINDINGS: Lower chest: No acute abnormality. Hepatobiliary: Mild cholelithiasis is noted without inflammation. Liver is unremarkable. No biliary dilatation is noted. Pancreas: Unremarkable. No pancreatic ductal dilatation or surrounding inflammatory changes. Spleen: Normal in size without focal abnormality. Adrenals/Urinary Tract: Adrenal glands are unremarkable. Kidneys are normal, without renal calculi, focal lesion, or hydronephrosis. Bladder is unremarkable. Stomach/Bowel: The stomach and appendix appear normal. There is no evidence of bowel obstruction. Sigmoid diverticulosis is noted. Irregular density is seen posterior to sigmoid colon in area of prior diverticulitis most consistent with scarring from prior inflammation. No definite acute inflammation is noted. Vascular/Lymphatic: Aortic atherosclerosis. No  enlarged abdominal or pelvic lymph nodes. Reproductive: Prostatic calcifications are noted. Other: No abdominal wall hernia or abnormality. No abdominopelvic ascites. Musculoskeletal: Stable multiple old lumbar compression fractures are noted. IMPRESSION: Cholelithiasis. Sigmoid diverticulosis is noted. Irregular density is seen posterior to sigmoid colon in area of prior diverticulitis most consistent with postinflammatory scarring. No definite acute abnormality is seen currently. Aortic Atherosclerosis (ICD10-I70.0). Electronically Signed   By: Marijo Conception, M.D.   On: 01/01/2018 15:54      Impression / Plan:   Peter Johnston is a 77 y.o. y/o male with hematochezia with stable hemoglobin since presentation  We discussed EGD and colonoscopy tomorrow to evaluate for source of GI bleed Given his history of duodenal ulcer years ago, EGD would also allow [place] to evaluate for any upper GI lesions.  Patient is refusing EGD and colonoscopy at this time He states he would like to see if his bleeding reoccurs as it is always resolved at this time prior to any endoscopic procedures.  We discussed the risk of underlying malignancy given that his colonoscopy was years ago, and endoscopic evaluation would allow [place] to evaluate for any underlying lesions.  He verbalized understanding of the risks and would not like to proceed with colonoscopy prep for colonoscopy at this time.  Source of GI bleed is likely lower from diverticulosis versus hemorrhoids versus malignancy however slow upper GI bleed can also present with hematochezia. PPI IV twice daily.  Change to p.o. once daily in 1 to 2 days for total of 30 days if no further hematochezia.  Continue serial CBCs and transfuse PRN Avoid NSAIDs Maintain 2 large-bore IV lines Please page GI with any acute hemodynamic changes, or signs of active GI bleeding  Would recommend testing for iron levels with ferritin and iron labs If low, replace iron  If  patient becomes agreeable to procedures, please let us know.  Patient would like conservative management at this time and is refusing procedures.  Thank you for involving me in the care of this patient.      LOS: 1 day   Virgel Manifold, MD  01/02/2018, 11:01 AM

## 2018-01-02 NOTE — Progress Notes (Signed)
[facility] [place] NAME: Peter Johnston    MR#:  [id number]  DATE OF BIRTH:  1941/02/20  SUBJECTIVE:  CHIEF COMPLAINT: Reports no rectal bleeding at this time.  Son at bedside  REVIEW OF SYSTEMS:  CONSTITUTIONAL: No fever, fatigue or weakness.  EYES: No blurred or double vision.  EARS, NOSE, AND THROAT: No tinnitus or ear pain.  RESPIRATORY: Reports chronic cough, denies shortness of breath, wheezing or hemoptysis.  CARDIOVASCULAR: No chest pain, orthopnea, edema.  GASTROINTESTINAL: No nausea, vomiting, diarrhea or abdominal pain.  GENITOURINARY: No dysuria, hematuria.  ENDOCRINE: No polyuria, nocturia,  HEMATOLOGY: No anemia, easy bruising or bleeding SKIN: No rash or lesion. MUSCULOSKELETAL: No joint pain or arthritis.   NEUROLOGIC: No tingling, numbness, weakness.  PSYCHIATRY: No anxiety or depression.   DRUG ALLERGIES:   Allergies  Allergen Reactions  . Tamsulosin Shortness Of Breath and Other (See Comments)    dizziness  . Cefdinir Other (See Comments)    SOB  . Gold-Containing Drug Products Other (See Comments)    Proteinuria  . Hydrochlorothiazide Other (See Comments)    BP dropped  . Sulfa Antibiotics Nausea Only    VITALS:  Blood pressure 127/76, pulse 91, temperature 97.8 F (36.6 C), temperature source Oral, resp. rate 20, height '5\' 8"'$  (1.727 m), weight 72.6 kg, SpO2 98 %.  PHYSICAL EXAMINATION:  GENERAL:  77 y.o.-year-old patient lying in the bed with no acute distress.  EYES: Pupils equal, round, reactive to light and accommodation. No scleral icterus. Extraocular muscles intact.  HEENT: Head atraumatic, normocephalic. Oropharynx and nasopharynx clear.  NECK:  Supple, no jugular venous distention. No thyroid enlargement, no tenderness.  LUNGS: Normal breath sounds bilaterally, no wheezing, rales,rhonchi or crepitation. No use of accessory muscles of respiration.  CARDIOVASCULAR: S1, S2 normal. No  murmurs, rubs, or gallops.  ABDOMEN: Soft, nontender, nondistended. Bowel sounds present. Marland Kitchen  EXTREMITIES: No pedal edema, cyanosis, or clubbing.  NEUROLOGIC: Cranial nerves II through XII are intact. Muscle strength  in all extremities. Sensation intact. Gait not checked.  PSYCHIATRIC: The patient is alert and oriented x 3.  SKIN: No obvious rash, lesion, or ulcer.    LABORATORY PANEL:   CBC Recent Labs  Lab 01/01/18 1229  01/02/18 1704  WBC 11.6*  --   --   HGB 11.9*   < > 11.0*  HCT 37.5*   < > 33.7*  PLT 215  --   --    < > = values in this interval not displayed.   ------------------------------------------------------------------------------------------------------------------  Chemistries  Recent Labs  Lab 01/01/18 1229 01/02/18 0504  NA 140 140  K 3.2* 4.2  CL 97* 102  CO2 36* 30  GLUCOSE 123* 105*  BUN 18 17  CREATININE 0.74 0.76  CALCIUM 8.7* 8.3*  AST 21  --   ALT 15  --   ALKPHOS 91  --   BILITOT 0.7  --    ------------------------------------------------------------------------------------------------------------------  Cardiac Enzymes Recent Labs  Lab 01/01/18 1229  TROPONINI <0.03   ------------------------------------------------------------------------------------------------------------------  RADIOLOGY:  Ct Abdomen Pelvis W Contrast  Result Date: 01/01/2018 CLINICAL DATA:  Lower abdominal pain, rectal bleeding. EXAM: CT ABDOMEN AND PELVIS WITH CONTRAST TECHNIQUE: Multidetector CT imaging of the abdomen and pelvis was performed using the standard protocol following bolus administration of intravenous contrast. CONTRAST:  132m ISOVUE-300 IOPAMIDOL (ISOVUE-300) INJECTION 61% COMPARISON:  CT scan of December 06, 2016. FINDINGS: Lower chest: No acute abnormality. Hepatobiliary: Mild cholelithiasis is noted  without inflammation. Liver is unremarkable. No biliary dilatation is noted. Pancreas: Unremarkable. No pancreatic ductal dilatation or  surrounding inflammatory changes. Spleen: Normal in size without focal abnormality. Adrenals/Urinary Tract: Adrenal glands are unremarkable. Kidneys are normal, without renal calculi, focal lesion, or hydronephrosis. Bladder is unremarkable. Stomach/Bowel: The stomach and appendix appear normal. There is no evidence of bowel obstruction. Sigmoid diverticulosis is noted. Irregular density is seen posterior to sigmoid colon in area of prior diverticulitis most consistent with scarring from prior inflammation. No definite acute inflammation is noted. Vascular/Lymphatic: Aortic atherosclerosis. No enlarged abdominal or pelvic lymph nodes. Reproductive: Prostatic calcifications are noted. Other: No abdominal wall hernia or abnormality. No abdominopelvic ascites. Musculoskeletal: Stable multiple old lumbar compression fractures are noted. IMPRESSION: Cholelithiasis. Sigmoid diverticulosis is noted. Irregular density is seen posterior to sigmoid colon in area of prior diverticulitis most consistent with postinflammatory scarring. No definite acute abnormality is seen currently. Aortic Atherosclerosis (ICD10-I70.0). Electronically Signed   By: Marijo Conception, M.D.   On: 01/01/2018 15:54    EKG:   Orders placed or performed during the hospital encounter of 01/01/18  . ED EKG  . ED EKG    ASSESSMENT AND PLAN:   77 year old male patient with history of COPD, hypertension, lung mass, iron deficiency anemia, hiatal hernia in the past resented to the emergency room for rectal bleed  -Acute gastrointestinal bleeding-lower GI likely from diverticulosis versus hemorrhoids versus malignancy Hemoglobin 12.2-11.6 Serial hemoglobin hematocrit monitoring Gastroenterology consulted, seen by Dr. Bonna Gains, patient is refusing EGD and colonoscopy at this time Clear liquid diet IV Protonix drip-can be changed to Protonix 40 mg IV twice daily which can be changed to p.o. once daily in a day or 2 for a total of 30 days if  no further hematochezia And avoid NSAIDs Hold blood thinner medications  -Emphysema Continue home oxygen Home dose inhalers and nebulization treatments  -Iron deficiency anemia Monitor hemoglobin hematocrit Iron supplements  -History of multiple myeloma status post treatment follow-up with oncology for surveillance as recommended  -DVT prophylaxis with sequential compression device to lower extremities  -Hypokalemia Replace potassium     All the records are reviewed and case discussed with Care Management/Social Workerr. Management plans discussed with the patient, family and they are in agreement.  CODE STATUS: fc   TOTAL TIME TAKING CARE OF THIS PATIENT: 35  minutes.   POSSIBLE D/C IN 1-2  DAYS, DEPENDING ON CLINICAL CONDITION.  Note: This dictation was prepared with Dragon dictation along with smaller phrase technology. Any transcriptional errors that result from this process are unintentional.   Nicholes Mango M.D on 01/02/2018 at 7:34 PM  Between 7am to 6pm - Pager - [phone] After 6pm go to [email or web address] - password [email or web address] [facility]  [facility]  Office  [phone]  CC: Primary care physician; Leone Haven, MD

## 2018-01-03 DIAGNOSIS — J449 Chronic obstructive pulmonary disease, unspecified: Secondary | ICD-10-CM | POA: Diagnosis not present

## 2018-01-03 DIAGNOSIS — I10 Essential (primary) hypertension: Secondary | ICD-10-CM | POA: Diagnosis not present

## 2018-01-03 DIAGNOSIS — E876 Hypokalemia: Secondary | ICD-10-CM | POA: Diagnosis not present

## 2018-01-03 DIAGNOSIS — K922 Gastrointestinal hemorrhage, unspecified: Secondary | ICD-10-CM | POA: Diagnosis not present

## 2018-01-03 DIAGNOSIS — D649 Anemia, unspecified: Secondary | ICD-10-CM | POA: Diagnosis not present

## 2018-01-03 LAB — HEMOGLOBIN AND HEMATOCRIT, BLOOD
HCT: 33.1 % — ABNORMAL LOW (ref 39.0–52.0)
HEMATOCRIT: 34.6 % — AB (ref 39.0–52.0)
HEMOGLOBIN: 10.5 g/dL — AB (ref 13.0–17.0)
Hemoglobin: 11.1 g/dL — ABNORMAL LOW (ref 13.0–17.0)

## 2018-01-03 MED ORDER — PANTOPRAZOLE SODIUM 40 MG PO TBEC: 40.0000 mg | DELAYED_RELEASE_TABLET | Freq: Every day | ORAL | 0 refills | Status: DC

## 2018-01-03 MED ORDER — IPRATROPIUM-ALBUTEROL 0.5-2.5 (3) MG/3ML IN SOLN: 3.0000 mL | Freq: Two times a day (BID) | RESPIRATORY_TRACT | Status: DC

## 2018-01-03 NOTE — Care Management Note (Signed)
Case Management Note  Patient Details  Name: JAQUES MINEER MRN: [id number] Date of Birth: 04-08-1940   Patient admitted from home with Gi bleeding.  Patient to discharge home today.  Patient states that he lives at home with his son and girlfriend. Patient states that his sister provides transportation.  PCP [place].  Patient denies issues with obtaining medications.  Patient states that he has chronic O2 through [employer], 2 RW, cane, and WC.  MD has ordered home health RN, and PT.  MD to update order to include draws for hemoglobin.  Patient in agreement and states that he does not have a preference of agency.  Referral made to [facility] with [place].  RNCM called son to update as well, voicemail was left.    RNCM signing off.   Subjective/Objective:                    Action/Plan:   Expected Discharge Date:  01/03/18               Expected Discharge Plan:  [place]  In-House Referral:     Discharge planning Services  CM Consult  Post Acute Care Choice:  Home Health Choice offered to:  Patient  DME Arranged:    DME Agency:     HH Arranged:  RN, PT [place] Agency:  [facility]  Status of Service:  Completed, signed off  If discussed at [place] of Stay Meetings, dates discussed:    Additional Comments:  Beverly Sessions, RN 01/03/2018, 10:55 AM

## 2018-01-03 NOTE — Discharge Summary (Signed)
[facility] [place] NAME: Peter Johnston    MR#:  [id number]  DATE OF BIRTH:  1940-10-23  DATE OF ADMISSION:  01/01/2018 ADMITTING PHYSICIAN: Saundra Shelling, MD  DATE OF DISCHARGE: 01/03/2018  2:43 PM  PRIMARY CARE PHYSICIAN: Leone Haven, MD   ADMISSION DIAGNOSIS:  Rectal bleeding [K62.5] COPD Iron deficiency anemia Hypertension Lung mass DISCHARGE DIAGNOSIS:  Active Problems:   GI bleed   Rectal bleeding COPD Hypertension  SECONDARY DIAGNOSIS:   Past Medical History:  Diagnosis Date  . Arthritis   . Asthma   . COPD (chronic obstructive pulmonary disease) ([place])   . Hypertension   . IDA (iron deficiency anemia)   . Lung mass      ADMITTING HISTORY Peter Johnston  is a 77 y.o. male with a known history of COPD, hypertension, lung mass, iron deficiency anemia, hiatal hernia in the past presented to the emergency room with rectal bleed.  Patient had a couple of episodes of rectal bleed.  He is on home oxygen.  He was evaluated in the emergency room with a CT abdomen which showed sigmoid diverticulosis and cholelithiasis.  No complaints of any abdominal pain.  No vomiting of blood.  Hospitalist service was consulted for further care.  Hemoglobin is greater than 11.  HOSPITAL COURSE:  Admitted to medical floor started on IV Protonix drip.  Blood thinner medications were held.  Serial hemoglobin hematocrit was monitored.  Gastroenterology consultation was done for gastrointestinal bleeding he.  Patient do not want any EGD or colonoscopy procedures considering the COPD status of the lungs.  Procedures were deferred.  Hemoglobin has been stable.  Patient will be discharged home on oral proton pump inhibitor and follow-up with GI in the clinic and with primary care physician.  Patient will be discharged home with home health services .  CONSULTS OBTAINED:  Treatment Team:  Lucilla Lame, MD  DRUG ALLERGIES:   Allergies  Allergen  Reactions  . Tamsulosin Shortness Of Breath and Other (See Comments)    dizziness  . Cefdinir Other (See Comments)    SOB  . Gold-Containing Drug Products Other (See Comments)    Proteinuria  . Hydrochlorothiazide Other (See Comments)    BP dropped  . Sulfa Antibiotics Nausea Only    DISCHARGE MEDICATIONS:   Allergies as of 01/03/2018      Reactions   Tamsulosin Shortness Of Breath, Other (See Comments)   dizziness   Cefdinir Other (See Comments)   SOB   Gold-containing Drug Products Other (See Comments)   Proteinuria   Hydrochlorothiazide Other (See Comments)   BP dropped   Sulfa Antibiotics Nausea Only      Medication List    STOP taking these medications   fenofibrate 145 MG tablet Commonly known as:  TRICOR     TAKE these medications   acetaminophen 325 MG tablet Commonly known as:  TYLENOL Take 650 mg by mouth 2 (two) times daily as needed for moderate pain.   albuterol 108 (90 Base) MCG/ACT inhaler Commonly known as:  PROVENTIL HFA;VENTOLIN HFA Inhale 2 puffs into the lungs every 6 (six) hours as needed for wheezing or shortness of breath.   allopurinol 300 MG tablet Commonly known as:  ZYLOPRIM Take 300 mg by mouth daily.   atenolol 25 MG tablet Commonly known as:  TENORMIN TAKE 1 TABLET DAILY   bimatoprost 0.01 % Soln Commonly known as:  LUMIGAN Place 1 drop into both eyes 2 (two) times  daily.   ENBREL 50 MG/ML injection Generic drug:  etanercept Inject 50 mg into the skin every Wednesday.   furosemide 20 MG tablet Commonly known as:  LASIX Take 1 tablet by mouth daily as needed for edema.   ipratropium-albuterol 0.5-2.5 (3) MG/3ML Soln Commonly known as:  DUONEB Inhale 3 mLs into the lungs every 4 (four) hours as needed. What changed:  reasons to take this   pantoprazole 40 MG tablet Commonly known as:  PROTONIX Take 1 tablet (40 mg total) by mouth daily. What changed:  See the new instructions.   predniSONE 10 MG tablet Commonly known  as:  DELTASONE TAKE 1 TABLET BY MOUTH  DAILY WITH BREAKFAST What changed:  when to take this   theophylline 400 MG 24 hr tablet Commonly known as:  UNIPHYL TAKE 1 TABLET BY MOUTH  DAILY   traMADol 50 MG tablet Commonly known as:  ULTRAM Take 50 mg by mouth daily.   Travoprost (BAK Free) 0.004 % Soln ophthalmic solution Commonly known as:  TRAVATAN Place 1 drop into both eyes at bedtime.   umeclidinium-vilanterol 62.5-25 MCG/INH Aepb Commonly known as:  ANORO ELLIPTA Inhale 1 puff daily What changed:    how much to take  how to take this  when to take this  additional instructions       Today  Patient seen and evaluated today No new episodes of rectal bleed Tolerating diet well. Hemoglobin stable VITAL SIGNS:  Blood pressure 131/71, pulse 91, temperature 98.6 F (37 C), temperature source Oral, resp. rate 20, height 5\' 8"  (1.727 m), weight 72.6 kg, SpO2 96 %.  I/O:    Intake/Output Summary (Last 24 hours) at 01/03/2018 1643 Last data filed at 01/03/2018 1300 Gross per 24 hour  Intake 1657.39 ml  Output 600 ml  Net 1057.39 ml    PHYSICAL EXAMINATION:  Physical Exam  GENERAL:  77 y.o.-year-old patient lying in the bed with no acute distress.  LUNGS: Normal breath sounds bilaterally, no wheezing, rales,rhonchi or crepitation. No use of accessory muscles of respiration.  CARDIOVASCULAR: S1, S2 normal. No murmurs, rubs, or gallops.  ABDOMEN: Soft, non-tender, non-distended. Bowel sounds present. No organomegaly or mass.  NEUROLOGIC: Moves all 4 extremities. PSYCHIATRIC: The patient is alert and oriented x 3.  SKIN: No obvious rash, lesion, or ulcer.   DATA REVIEW:   CBC Recent Labs  Lab 01/01/18 1229  01/03/18 0859  WBC 11.6*  --   --   HGB 11.9*   < > 11.1*  HCT 37.5*   < > 34.6*  PLT 215  --   --    < > = values in this interval not displayed.    Chemistries  Recent Labs  Lab 01/01/18 1229 01/02/18 0504  NA 140 140  K 3.2* 4.2  CL 97* 102   CO2 36* 30  GLUCOSE 123* 105*  BUN 18 17  CREATININE 0.74 0.76  CALCIUM 8.7* 8.3*  AST 21  --   ALT 15  --   ALKPHOS 91  --   BILITOT 0.7  --     Cardiac Enzymes Recent Labs  Lab 01/01/18 1229  TROPONINI <0.03    Microbiology Results  Results for orders placed or performed in visit on 11/03/17  Urine Culture     Status: None   Collection Time: 11/03/17  4:24 PM  Result Value Ref Range Status   MICRO NUMBER: [id number]  Final   SPECIMEN QUALITY: ADEQUATE  Final   Sample Source  URINE  Final   STATUS: FINAL  Final   Result:   Final    Multiple organisms present, each less than 10,000 CFU/mL. These organisms, commonly found on external and internal genitalia, are considered to be colonizers. No further testing performed.    RADIOLOGY:  No results found.  Follow up with PCP in 1 week.  Management plans discussed with the patient, family and they are in agreement.  CODE STATUS: Full code    Code Status Orders  (From admission, onward)         Start     Ordered   01/01/18 1729  Full code  Continuous     01/01/18 1729        Code Status History    This patient has a current code status but no historical code status.    Advance Directive Documentation     Most Recent Value  Type of Advance Directive  Healthcare Power of Attorney, Living will  Pre-existing out of facility DNR order (yellow form or pink MOST form)  -  "MOST" Form in Place?  -      TOTAL TIME TAKING CARE OF THIS PATIENT ON DAY OF DISCHARGE: more than 33 minutes.   Saundra Shelling M.D on 01/03/2018 at 4:43 PM  Between 7am to 6pm - Pager - [phone]  After 6pm go to [email or web address] - password [email or web address] [facility]  Office  [phone]  CC: Primary care physician; Leone Haven, MD  Note: This dictation was prepared with Dragon dictation along with smaller phrase technology. Any transcriptional errors that result from this process are unintentional.

## 2018-01-03 NOTE — Progress Notes (Signed)
   01/03/18 1000  Clinical Encounter Type  Visited With Patient  Visit Type Initial;Spiritual support  Spiritual Encounters  Spiritual Needs Emotional   Patient spoke about spiritual support, but focused on his poor diet and its possible connection to his health. He also talked about falling and his concerns about future falls. Chaplain offered pastoral presence and emotional support.

## 2018-01-03 NOTE — Progress Notes (Signed)
Pt discharged per MD order. IV removed. Discharge instructions reviewed with pt. Pt verbalized understanding and all questions answered to pt satisfaction. Pt taken to car in wheelchair by staff.

## 2018-01-04 ENCOUNTER — Telehealth: Payer: Self-pay | Admitting: Family Medicine

## 2018-01-04 NOTE — Telephone Encounter (Signed)
Transition Care Management Follow-up Telephone Call  How have you been since you were released from the hospital? Patient stated he is feeling better, since being home and getting somesleep.   Do you understand why you were in the hospital? yes   Do you understand the discharge instrcutions? yes  Items Reviewed:  Medications reviewed: yes  Allergies reviewed: yes  Dietary changes reviewed: yes  Referrals reviewed: yes   Functional Questionnaire:   Activities of Daily Living (ADLs):   He states they are independent in the following: ambulation, bathing and hygiene, feeding, continence, grooming, toileting and dressing States they require assistance with the following: NO assitance needed at this time   Any transportation issues/concerns?: no   Any patient concerns? no   Confirmed importance and date/time of follow-up visits scheduled: yes   Confirmed with patient if condition begins to worsen call PCP or go to the ER.  Patient was given the Call-a-Nurse line [phone]: yes

## 2018-01-08 ENCOUNTER — Telehealth: Payer: Self-pay | Admitting: Family Medicine

## 2018-01-08 NOTE — Telephone Encounter (Signed)
Noted.  Please try to contact him to let him know they have been trying to get in touch with him to set up home health.

## 2018-01-08 NOTE — Telephone Encounter (Signed)
Sent to PCP as an FYI  

## 2018-01-08 NOTE — Telephone Encounter (Signed)
Noted  

## 2018-01-08 NOTE — Telephone Encounter (Signed)
Patient does not wish to come in for HFU.

## 2018-01-08 NOTE — Telephone Encounter (Signed)
Pt was called for his appt reminder, pt states he cannot come in for his HFU he stated several things like he does not have a ride, its too early and even went into his diagnosis. I advised him that its important that he comes in. Message was forwarded to [facility].

## 2018-01-08 NOTE — Telephone Encounter (Signed)
Copied from [place] [id number]. Topic: Quick Communication - See Telephone Encounter >> Jan 08, 2018  9:04 AM Bea Graff, NT wrote: CRM for notification. See Telephone encounter for: 01/08/18. Peter Johnston, with [facility] states they have not been able to go out to see the pt as they have not been able to get in touch with him. Peter states they may not be able to draw the labs tomorrow as ordered due to not being able to get in touch with the pt. CB#: [phone]

## 2018-01-09 ENCOUNTER — Telehealth: Payer: Self-pay | Admitting: Family Medicine

## 2018-01-09 NOTE — Telephone Encounter (Signed)
Called Amy as well and left a VM with pt's name and DOB and a better number for her to reach the patient at.

## 2018-01-09 NOTE — Telephone Encounter (Signed)
Called and spoke with pt. Pt advised. Gave patient Amy's phone number so he could contact her.

## 2018-01-09 NOTE — Telephone Encounter (Signed)
Copied from [place] [phone]. Topic: General - Other >> Jan 09, 2018  4:53 PM Keene Breath wrote: Reason for CRM: Nurse Amy with [facility] called to inform the doctor that she went out to the patient today, 01/09/18, and patient refused nursing services.  Please advise.  CB# [phone]

## 2018-01-10 ENCOUNTER — Inpatient Hospital Stay: Payer: Medicare Other | Admitting: Family Medicine

## 2018-01-10 NOTE — Telephone Encounter (Signed)
Noted.  Please follow-up with the patient and reinforce that it would be a good idea to have home health nursing come to his house to evaluate him.

## 2018-01-11 DIAGNOSIS — J449 Chronic obstructive pulmonary disease, unspecified: Secondary | ICD-10-CM | POA: Diagnosis not present

## 2018-01-11 DIAGNOSIS — R0602 Shortness of breath: Secondary | ICD-10-CM | POA: Diagnosis not present

## 2018-01-11 DIAGNOSIS — J9610 Chronic respiratory failure, unspecified whether with hypoxia or hypercapnia: Secondary | ICD-10-CM | POA: Diagnosis not present

## 2018-01-11 NOTE — Telephone Encounter (Signed)
Called AMY back and left a VM to call back.

## 2018-01-11 NOTE — Telephone Encounter (Signed)
Called Amy and left a detailed VM with pt's name and DOB advising that the pt is agreeable to home health assistant for a nurse to come and evaluate hime.

## 2018-01-11 NOTE — Telephone Encounter (Signed)
[facility] is requesting a call back from [place]. Please advise

## 2018-01-11 NOTE — Telephone Encounter (Signed)
Called pt and left a detailed VM asking patient to call us back. CRM created and sent to [facility].

## 2018-01-11 NOTE — Telephone Encounter (Signed)
Pt called back he would like for Dr. Caryl Bis to get him set up for home health nurse. Pt was agreeable to having this done.

## 2018-01-11 NOTE — Telephone Encounter (Signed)
Please contact the home health nurse at the number in the initial message to let them know that the patient is agreeable to be seen by them. Thanks.

## 2018-01-12 ENCOUNTER — Telehealth: Payer: Self-pay | Admitting: Family Medicine

## 2018-01-12 DIAGNOSIS — M1990 Unspecified osteoarthritis, unspecified site: Secondary | ICD-10-CM | POA: Diagnosis not present

## 2018-01-12 DIAGNOSIS — K5790 Diverticulosis of intestine, part unspecified, without perforation or abscess without bleeding: Secondary | ICD-10-CM | POA: Diagnosis not present

## 2018-01-12 DIAGNOSIS — R918 Other nonspecific abnormal finding of lung field: Secondary | ICD-10-CM | POA: Diagnosis not present

## 2018-01-12 DIAGNOSIS — J449 Chronic obstructive pulmonary disease, unspecified: Secondary | ICD-10-CM | POA: Diagnosis not present

## 2018-01-12 DIAGNOSIS — I10 Essential (primary) hypertension: Secondary | ICD-10-CM | POA: Diagnosis not present

## 2018-01-12 DIAGNOSIS — D509 Iron deficiency anemia, unspecified: Secondary | ICD-10-CM | POA: Diagnosis not present

## 2018-01-12 DIAGNOSIS — Z87891 Personal history of nicotine dependence: Secondary | ICD-10-CM | POA: Diagnosis not present

## 2018-01-12 DIAGNOSIS — K259 Gastric ulcer, unspecified as acute or chronic, without hemorrhage or perforation: Secondary | ICD-10-CM | POA: Diagnosis not present

## 2018-01-12 NOTE — Telephone Encounter (Signed)
Copied from [place] [phone]. Topic: Quick Communication - Home Health Verbal Orders >> Jan 12, 2018  3:51 PM Yvette Rack wrote: Caller/Agency: Cherly Anderson / [facility] Number: [phone] Requesting OT/PT/Skilled Nursing/Social Work: PT  Frequency: 1 time a week for 1  week and 2 times a week for 2 weeks

## 2018-01-12 NOTE — Telephone Encounter (Signed)
Orders given PT , per Raquel [facility]  Only requesting PT orders.

## 2018-01-12 NOTE — Telephone Encounter (Signed)
Verbal orders can be given. 

## 2018-01-15 DIAGNOSIS — M1A00X0 Idiopathic chronic gout, unspecified site, without tophus (tophi): Secondary | ICD-10-CM | POA: Diagnosis not present

## 2018-01-15 DIAGNOSIS — D508 Other iron deficiency anemias: Secondary | ICD-10-CM | POA: Diagnosis not present

## 2018-01-15 DIAGNOSIS — C3412 Malignant neoplasm of upper lobe, left bronchus or lung: Secondary | ICD-10-CM | POA: Diagnosis not present

## 2018-01-15 DIAGNOSIS — M0579 Rheumatoid arthritis with rheumatoid factor of multiple sites without organ or systems involvement: Secondary | ICD-10-CM | POA: Diagnosis not present

## 2018-01-15 NOTE — Telephone Encounter (Signed)
Noted  

## 2018-01-15 NOTE — Telephone Encounter (Signed)
Called and spoke with Amy. Amy stated that she was able to reach out to the patient and he declined nursing assistance at home. Pt does NOT feel like he needs this. Pt stated that he just wanted to continue with PT and get stronger. Amy just wanted [place] to be informed but does feel that the patient would benefit from nursing assistance.   Sent to PCP as an [place]

## 2018-01-16 DIAGNOSIS — M1990 Unspecified osteoarthritis, unspecified site: Secondary | ICD-10-CM | POA: Diagnosis not present

## 2018-01-16 DIAGNOSIS — Z87891 Personal history of nicotine dependence: Secondary | ICD-10-CM | POA: Diagnosis not present

## 2018-01-16 DIAGNOSIS — D509 Iron deficiency anemia, unspecified: Secondary | ICD-10-CM | POA: Diagnosis not present

## 2018-01-16 DIAGNOSIS — K5790 Diverticulosis of intestine, part unspecified, without perforation or abscess without bleeding: Secondary | ICD-10-CM | POA: Diagnosis not present

## 2018-01-16 DIAGNOSIS — J449 Chronic obstructive pulmonary disease, unspecified: Secondary | ICD-10-CM | POA: Diagnosis not present

## 2018-01-16 DIAGNOSIS — I10 Essential (primary) hypertension: Secondary | ICD-10-CM | POA: Diagnosis not present

## 2018-01-16 DIAGNOSIS — K259 Gastric ulcer, unspecified as acute or chronic, without hemorrhage or perforation: Secondary | ICD-10-CM | POA: Diagnosis not present

## 2018-01-16 DIAGNOSIS — R918 Other nonspecific abnormal finding of lung field: Secondary | ICD-10-CM | POA: Diagnosis not present

## 2018-01-18 DIAGNOSIS — M1990 Unspecified osteoarthritis, unspecified site: Secondary | ICD-10-CM | POA: Diagnosis not present

## 2018-01-18 DIAGNOSIS — Z87891 Personal history of nicotine dependence: Secondary | ICD-10-CM | POA: Diagnosis not present

## 2018-01-18 DIAGNOSIS — I10 Essential (primary) hypertension: Secondary | ICD-10-CM | POA: Diagnosis not present

## 2018-01-18 DIAGNOSIS — D509 Iron deficiency anemia, unspecified: Secondary | ICD-10-CM | POA: Diagnosis not present

## 2018-01-18 DIAGNOSIS — K259 Gastric ulcer, unspecified as acute or chronic, without hemorrhage or perforation: Secondary | ICD-10-CM | POA: Diagnosis not present

## 2018-01-18 DIAGNOSIS — J449 Chronic obstructive pulmonary disease, unspecified: Secondary | ICD-10-CM | POA: Diagnosis not present

## 2018-01-18 DIAGNOSIS — R918 Other nonspecific abnormal finding of lung field: Secondary | ICD-10-CM | POA: Diagnosis not present

## 2018-01-18 DIAGNOSIS — K5790 Diverticulosis of intestine, part unspecified, without perforation or abscess without bleeding: Secondary | ICD-10-CM | POA: Diagnosis not present

## 2018-01-23 DIAGNOSIS — I10 Essential (primary) hypertension: Secondary | ICD-10-CM | POA: Diagnosis not present

## 2018-01-23 DIAGNOSIS — K5790 Diverticulosis of intestine, part unspecified, without perforation or abscess without bleeding: Secondary | ICD-10-CM | POA: Diagnosis not present

## 2018-01-23 DIAGNOSIS — J449 Chronic obstructive pulmonary disease, unspecified: Secondary | ICD-10-CM | POA: Diagnosis not present

## 2018-01-23 DIAGNOSIS — Z87891 Personal history of nicotine dependence: Secondary | ICD-10-CM | POA: Diagnosis not present

## 2018-01-23 DIAGNOSIS — M1990 Unspecified osteoarthritis, unspecified site: Secondary | ICD-10-CM | POA: Diagnosis not present

## 2018-01-23 DIAGNOSIS — D509 Iron deficiency anemia, unspecified: Secondary | ICD-10-CM | POA: Diagnosis not present

## 2018-01-23 DIAGNOSIS — R918 Other nonspecific abnormal finding of lung field: Secondary | ICD-10-CM | POA: Diagnosis not present

## 2018-01-23 DIAGNOSIS — K259 Gastric ulcer, unspecified as acute or chronic, without hemorrhage or perforation: Secondary | ICD-10-CM | POA: Diagnosis not present

## 2018-01-24 ENCOUNTER — Telehealth: Payer: Self-pay

## 2018-01-24 DIAGNOSIS — H401112 Primary open-angle glaucoma, right eye, moderate stage: Secondary | ICD-10-CM | POA: Diagnosis not present

## 2018-01-24 LAB — HM DIABETES EYE EXAM

## 2018-01-24 NOTE — Telephone Encounter (Signed)
PT WALKED IN CLINIC ASKING FOR SAMPLES OS ANORA. ADVISED PT THAT IF THEY NEED A PRESCRIPTION TO LET OFFICE KNOW BC WE DO NOT CARRY A LOT OF SAMPLES.

## 2018-01-25 DIAGNOSIS — D509 Iron deficiency anemia, unspecified: Secondary | ICD-10-CM | POA: Diagnosis not present

## 2018-01-25 DIAGNOSIS — K5790 Diverticulosis of intestine, part unspecified, without perforation or abscess without bleeding: Secondary | ICD-10-CM | POA: Diagnosis not present

## 2018-01-25 DIAGNOSIS — M1990 Unspecified osteoarthritis, unspecified site: Secondary | ICD-10-CM | POA: Diagnosis not present

## 2018-01-25 DIAGNOSIS — I10 Essential (primary) hypertension: Secondary | ICD-10-CM | POA: Diagnosis not present

## 2018-01-25 DIAGNOSIS — Z87891 Personal history of nicotine dependence: Secondary | ICD-10-CM | POA: Diagnosis not present

## 2018-01-25 DIAGNOSIS — K259 Gastric ulcer, unspecified as acute or chronic, without hemorrhage or perforation: Secondary | ICD-10-CM | POA: Diagnosis not present

## 2018-01-25 DIAGNOSIS — R918 Other nonspecific abnormal finding of lung field: Secondary | ICD-10-CM | POA: Diagnosis not present

## 2018-01-25 DIAGNOSIS — J449 Chronic obstructive pulmonary disease, unspecified: Secondary | ICD-10-CM | POA: Diagnosis not present

## 2018-01-28 ENCOUNTER — Other Ambulatory Visit: Payer: Self-pay | Admitting: Internal Medicine

## 2018-01-28 DIAGNOSIS — J449 Chronic obstructive pulmonary disease, unspecified: Secondary | ICD-10-CM

## 2018-01-29 ENCOUNTER — Other Ambulatory Visit: Payer: Self-pay | Admitting: Internal Medicine

## 2018-01-30 ENCOUNTER — Telehealth: Payer: Self-pay

## 2018-01-30 NOTE — Telephone Encounter (Signed)
Pt called wanting refills on medication sent to [facility] which were already sent on today and yesterday, pt also stated that his Peter Johnston is too expensive and wasn't able to afford it so if we could give him some samples that will help a lot or if there was a different medication that could be substituted for the Anora.

## 2018-01-31 ENCOUNTER — Ambulatory Visit: Payer: Medicare Other | Admitting: Gastroenterology

## 2018-02-07 ENCOUNTER — Ambulatory Visit (INDEPENDENT_AMBULATORY_CARE_PROVIDER_SITE_OTHER): Payer: Medicare Other | Admitting: Family Medicine

## 2018-02-07 ENCOUNTER — Encounter: Payer: Self-pay | Admitting: Family Medicine

## 2018-02-07 VITALS — BP 120/72 | HR 92 | Temp 98.1°F | Ht 68.0 in

## 2018-02-07 DIAGNOSIS — Z283 Underimmunization status: Secondary | ICD-10-CM | POA: Diagnosis not present

## 2018-02-07 DIAGNOSIS — Z23 Encounter for immunization: Secondary | ICD-10-CM

## 2018-02-07 DIAGNOSIS — D509 Iron deficiency anemia, unspecified: Secondary | ICD-10-CM | POA: Diagnosis not present

## 2018-02-07 DIAGNOSIS — K625 Hemorrhage of anus and rectum: Secondary | ICD-10-CM

## 2018-02-07 DIAGNOSIS — G4700 Insomnia, unspecified: Secondary | ICD-10-CM

## 2018-02-07 DIAGNOSIS — K219 Gastro-esophageal reflux disease without esophagitis: Secondary | ICD-10-CM

## 2018-02-07 DIAGNOSIS — Z2839 Other underimmunization status: Secondary | ICD-10-CM

## 2018-02-07 DIAGNOSIS — L989 Disorder of the skin and subcutaneous tissue, unspecified: Secondary | ICD-10-CM | POA: Insufficient documentation

## 2018-02-07 DIAGNOSIS — J449 Chronic obstructive pulmonary disease, unspecified: Secondary | ICD-10-CM

## 2018-02-07 LAB — CBC
HEMATOCRIT: 36.2 % — AB (ref 39.0–52.0)
Hemoglobin: 11.8 g/dL — ABNORMAL LOW (ref 13.0–17.0)
MCHC: 32.6 g/dL (ref 30.0–36.0)
MCV: 89.1 fl (ref 78.0–100.0)
PLATELETS: 268 10*3/uL (ref 150.0–400.0)
RBC: 4.07 Mil/uL — AB (ref 4.22–5.81)
RDW: 16.5 % — ABNORMAL HIGH (ref 11.5–15.5)
WBC: 15 10*3/uL — AB (ref 4.0–10.5)

## 2018-02-07 MED ORDER — ESOMEPRAZOLE MAGNESIUM 20 MG PO CPDR: 20.0000 mg | DELAYED_RELEASE_CAPSULE | Freq: Every day | ORAL | 2 refills | Status: AC

## 2018-02-07 MED ORDER — FLUTICASONE PROPIONATE 50 MCG/ACT NA SUSP: 2.0000 | Freq: Every day | NASAL | 6 refills | Status: AC

## 2018-02-07 NOTE — Assessment & Plan Note (Addendum)
Stable.  He will continue to see pulmonology.  Trial of Flonase for nasal congestion.

## 2018-02-07 NOTE — Patient Instructions (Signed)
Nice to see you. We will check labs today.  Please start on the Nexium for your reflux.  You can discontinue the Protonix.  If the Nexium is not beneficial please let us know. Please complete the stool cards. Please try the Flonase. If you have recurrent bleeding please be evaluated.

## 2018-02-07 NOTE — Assessment & Plan Note (Signed)
He will keep his appointment with dermatology.

## 2018-02-07 NOTE — Assessment & Plan Note (Signed)
I discussed that he is getting adequate hours of sleep though it is broken up throughout the day.  I encouraged him to try to not sleep during the day so that he may get a full night sleep.  He will monitor.

## 2018-02-07 NOTE — Assessment & Plan Note (Signed)
Patient reports no recurrence.  I agree that he would not be a good candidate for an EGD or colonoscopy.  He would be quite high risk given his COPD issues.  We will check stool cards.  We will recheck a CBC.  He is given return precautions.

## 2018-02-07 NOTE — Progress Notes (Signed)
Tommi Rumps, MD Phone: [phone]  Peter Johnston is a 77 y.o. male who presents today for follow-up.  CC: Rectal bleeding, anemia, sleep difficulty, COPD, skin lesions  Rectal bleeding: Patient was hospitalized for rectal bleeding earlier in October.  He has had no additional rectal bleeding.  He had mild amount of abdominal pain at that time.  He had a CT abdomen and pelvis revealing no obvious cause.  He was seen by GI and EGD and colonoscopy were discussed though were deferred by the patient given risk given his COPD.  He notes very occasional discomfort in his lower abdomen.  It comes in spells and resolves on its own.  Describes it as a nauseous pain.  No vomiting.  His stools are brown.  COPD: He is using his inhaler and theophylline as well as prednisone.  He follows with pulmonology.  He coughing.  No wheezing.  His breathing is stable.    GERD: He does note the Protonix is not very beneficial.  He takes Alka-Seltzer in the morning.  He was on Zantac which is beneficial though stopped related to concerns for cancer causing agents.  He describes it as a burning satiety and sour taste in his esophagus and throat.  Recent blood in his stool.  Sleeping difficulty: Patient reports he has difficulty sleeping.  He goes to bed around 10:30 wakes up at 3 AM.  He will get up and watch TV.  He will fall asleep for 3 to 4 hours during the day.  Patient notes hyperkeratotic lesions on his skin.  He has seen rheumatology for them and they have referred him to dermatology and he has an appointment.  He is using a cream on them.  They do itch.  Patient reports reading about hepatitis A and being concerned about whether or not he needs a vaccination.  Social History   Tobacco Use  Smoking Status Former Smoker  . Packs/day: 2.00  . Years: 63.00  . Pack years: 126.00  . Types: Cigarettes  . Last attempt to quit: 01/22/2014  . Years since quitting: 4.0  Smokeless Tobacco Never Used    Tobacco Comment   Stopped smoking cigarettes Sunday and uses the vapor e-cig to help with cravings     ROS see history of present illness  Objective  Physical Exam Vitals:   02/07/18 1322  BP: 120/72  Pulse: 92  Temp: 98.1 F (36.7 C)  SpO2: 90%    BP Readings from Last 3 Encounters:  02/07/18 120/72  01/03/18 131/71  12/25/17 125/76   Wt Readings from Last 3 Encounters:  01/01/18 160 lb (72.6 kg)  12/25/17 155 lb (70.3 kg)  11/15/17 160 lb (72.6 kg)    Physical Exam  Constitutional: No distress.  Cardiovascular: Normal rate, regular rhythm and normal heart sounds.  Pulmonary/Chest: Effort normal and breath sounds normal.  Abdominal: Soft. Bowel sounds are normal. He exhibits no distension. There is no tenderness.  Musculoskeletal: He exhibits no edema.  Neurological: He is alert.  Skin: Skin is warm and dry. He is not diaphoretic.  Patient has several hyperkeratotic skin lesions on his right forearm   Assessment/Plan: Please see individual problem list.  COPD, severe ([place]) Stable.  He will continue to see pulmonology.  Trial of Flonase for nasal congestion.  Rectal bleeding Patient reports no recurrence.  I agree that he would not be a good candidate for an EGD or colonoscopy.  He would be quite high risk given his COPD issues.  We will check stool cards.  We will recheck a CBC.  He is given return precautions.  Insomnia I discussed that he is getting adequate hours of sleep though it is broken up throughout the day.  I encouraged him to try to not sleep during the day so that he may get a full night sleep.  He will monitor.  GERD (gastroesophageal reflux disease) We will switch the patient and Nexium.  He will discontinue Protonix when he gets this from the pharmacy.  Skin lesions He will keep his appointment with dermatology.  We will check hepatitis A screening lab work.  Health Maintenance: Flu vaccination given.  Patient does report a very distant  history of guillan Aris Lot not related to flu vaccination.  The patient has received the flu vaccination for multiple years without any issues.  Based on review of up-to-date the benefit of him getting the flu vaccination given his COPD outweighs the potential risk of the vaccination.  Orders Placed This Encounter  Procedures  . Fecal occult blood, imunochemical    Standing Status:   Future    Standing Expiration Date:   02/08/2019  . Flu vaccine HIGH DOSE PF (Fluzone High dose)  . Hepatitis A Ab, Total  . CBC    Meds ordered this encounter  Medications  . fluticasone (FLONASE) 50 MCG/ACT nasal spray    Sig: Place 2 sprays into both nostrils daily.    Dispense:  16 g    Refill:  6  . esomeprazole (NEXIUM) 20 MG capsule    Sig: Take 1 capsule (20 mg total) by mouth daily before breakfast.    Dispense:  30 capsule    Refill:  2     Tommi Rumps, MD [place]

## 2018-02-07 NOTE — Assessment & Plan Note (Signed)
We will switch the patient and Nexium.  He will discontinue Protonix when he gets this from the pharmacy.

## 2018-02-08 ENCOUNTER — Other Ambulatory Visit: Payer: Self-pay | Admitting: Family Medicine

## 2018-02-08 DIAGNOSIS — D72829 Elevated white blood cell count, unspecified: Secondary | ICD-10-CM

## 2018-02-08 LAB — HEPATITIS A ANTIBODY, TOTAL: Hepatitis A AB,Total: NONREACTIVE

## 2018-02-09 ENCOUNTER — Telehealth: Payer: Self-pay | Admitting: Family Medicine

## 2018-02-09 NOTE — Telephone Encounter (Signed)
Lab results given and documented in result note 

## 2018-02-09 NOTE — Telephone Encounter (Signed)
Pt called back  Copied from [place] [phone]. Topic: Quick Communication - Lab Results (Clinic Use ONLY) >> Feb 09, 2018  2:45 PM Bare, Patriciaann Clan, CMA wrote: Called patient to inform them of  lab results. When patient returns call, triage nurse may disclose results.

## 2018-02-11 DIAGNOSIS — J9610 Chronic respiratory failure, unspecified whether with hypoxia or hypercapnia: Secondary | ICD-10-CM | POA: Diagnosis not present

## 2018-02-11 DIAGNOSIS — R0602 Shortness of breath: Secondary | ICD-10-CM | POA: Diagnosis not present

## 2018-02-11 DIAGNOSIS — J449 Chronic obstructive pulmonary disease, unspecified: Secondary | ICD-10-CM | POA: Diagnosis not present

## 2018-02-12 ENCOUNTER — Telehealth: Payer: Self-pay

## 2018-02-12 NOTE — Telephone Encounter (Signed)
Called pt and left a message for him to call back to inform him that we have samples of Anora for him at the front.

## 2018-02-13 ENCOUNTER — Ambulatory Visit: Payer: Self-pay | Admitting: Internal Medicine

## 2018-02-16 ENCOUNTER — Other Ambulatory Visit (INDEPENDENT_AMBULATORY_CARE_PROVIDER_SITE_OTHER): Payer: Medicare Other

## 2018-02-16 DIAGNOSIS — D72829 Elevated white blood cell count, unspecified: Secondary | ICD-10-CM | POA: Diagnosis not present

## 2018-02-16 LAB — CBC WITH DIFFERENTIAL/PLATELET
BASOS ABS: 0.1 10*3/uL (ref 0.0–0.1)
Basophils Relative: 0.5 % (ref 0.0–3.0)
Eosinophils Absolute: 0 10*3/uL (ref 0.0–0.7)
Eosinophils Relative: 0.3 % (ref 0.0–5.0)
HEMATOCRIT: 37.2 % — AB (ref 39.0–52.0)
Hemoglobin: 11.9 g/dL — ABNORMAL LOW (ref 13.0–17.0)
Lymphocytes Relative: 3.9 % — ABNORMAL LOW (ref 12.0–46.0)
Lymphs Abs: 0.6 10*3/uL — ABNORMAL LOW (ref 0.7–4.0)
MCHC: 32.1 g/dL (ref 30.0–36.0)
MCV: 89.6 fl (ref 78.0–100.0)
Monocytes Absolute: 0.4 10*3/uL (ref 0.1–1.0)
Monocytes Relative: 2.5 % — ABNORMAL LOW (ref 3.0–12.0)
NEUTROS ABS: 13.8 10*3/uL — AB (ref 1.4–7.7)
Neutrophils Relative %: 92.8 % — ABNORMAL HIGH (ref 43.0–77.0)
PLATELETS: 262 10*3/uL (ref 150.0–400.0)
RBC: 4.15 Mil/uL — ABNORMAL LOW (ref 4.22–5.81)
RDW: 16.2 % — ABNORMAL HIGH (ref 11.5–15.5)
WBC: 14.9 10*3/uL — ABNORMAL HIGH (ref 4.0–10.5)

## 2018-02-19 ENCOUNTER — Other Ambulatory Visit (INDEPENDENT_AMBULATORY_CARE_PROVIDER_SITE_OTHER): Payer: Medicare Other

## 2018-02-19 DIAGNOSIS — K625 Hemorrhage of anus and rectum: Secondary | ICD-10-CM | POA: Diagnosis not present

## 2018-02-19 LAB — FECAL OCCULT BLOOD, GUAIAC: Fecal Occult Blood: POSITIVE

## 2018-02-19 LAB — FECAL OCCULT BLOOD, IMMUNOCHEMICAL: FECAL OCCULT BLD: POSITIVE — AB

## 2018-02-20 ENCOUNTER — Ambulatory Visit: Payer: Self-pay | Admitting: Internal Medicine

## 2018-02-23 ENCOUNTER — Other Ambulatory Visit: Payer: Self-pay | Admitting: Family Medicine

## 2018-02-23 DIAGNOSIS — D72829 Elevated white blood cell count, unspecified: Secondary | ICD-10-CM

## 2018-02-26 ENCOUNTER — Other Ambulatory Visit (INDEPENDENT_AMBULATORY_CARE_PROVIDER_SITE_OTHER): Payer: Medicare Other

## 2018-02-26 DIAGNOSIS — D72829 Elevated white blood cell count, unspecified: Secondary | ICD-10-CM | POA: Diagnosis not present

## 2018-02-27 LAB — CBC WITH DIFFERENTIAL/PLATELET
Basophils Absolute: 0.2 10*3/uL — ABNORMAL HIGH (ref 0.0–0.1)
Basophils Relative: 1.2 % (ref 0.0–3.0)
Eosinophils Absolute: 0 10*3/uL (ref 0.0–0.7)
Eosinophils Relative: 0.2 % (ref 0.0–5.0)
HEMATOCRIT: 38.3 % — AB (ref 39.0–52.0)
HEMOGLOBIN: 12.3 g/dL — AB (ref 13.0–17.0)
Lymphs Abs: 0.9 10*3/uL (ref 0.7–4.0)
MCHC: 32 g/dL (ref 30.0–36.0)
MCV: 89.6 fl (ref 78.0–100.0)
MONOS PCT: 2.8 % — AB (ref 3.0–12.0)
Monocytes Absolute: 0.4 10*3/uL (ref 0.1–1.0)
NEUTROS ABS: 12.1 10*3/uL — AB (ref 1.4–7.7)
Platelets: 267 10*3/uL (ref 150.0–400.0)
RBC: 4.27 Mil/uL (ref 4.22–5.81)
RDW: 15.8 % — AB (ref 11.5–15.5)
WBC: 13.6 10*3/uL — AB (ref 4.0–10.5)

## 2018-03-01 ENCOUNTER — Telehealth: Payer: Self-pay

## 2018-03-01 DIAGNOSIS — R300 Dysuria: Secondary | ICD-10-CM

## 2018-03-06 DIAGNOSIS — L570 Actinic keratosis: Secondary | ICD-10-CM | POA: Diagnosis not present

## 2018-03-06 DIAGNOSIS — C44319 Basal cell carcinoma of skin of other parts of face: Secondary | ICD-10-CM | POA: Diagnosis not present

## 2018-03-06 DIAGNOSIS — D485 Neoplasm of uncertain behavior of skin: Secondary | ICD-10-CM | POA: Diagnosis not present

## 2018-03-06 DIAGNOSIS — C44629 Squamous cell carcinoma of skin of left upper limb, including shoulder: Secondary | ICD-10-CM | POA: Diagnosis not present

## 2018-03-08 ENCOUNTER — Other Ambulatory Visit: Payer: Self-pay | Admitting: Family Medicine

## 2018-03-08 DIAGNOSIS — D72829 Elevated white blood cell count, unspecified: Secondary | ICD-10-CM

## 2018-03-12 ENCOUNTER — Telehealth: Payer: Self-pay | Admitting: *Deleted

## 2018-03-12 NOTE — Telephone Encounter (Addendum)
RN Spoke with Dr. Rogue Bussing regarding referral for leukocytosis. Md states pt has upcoming apts already in January. Patient needs to keep these apts as scheduled.

## 2018-03-13 DIAGNOSIS — J449 Chronic obstructive pulmonary disease, unspecified: Secondary | ICD-10-CM | POA: Diagnosis not present

## 2018-03-13 DIAGNOSIS — R0602 Shortness of breath: Secondary | ICD-10-CM | POA: Diagnosis not present

## 2018-03-13 DIAGNOSIS — J9610 Chronic respiratory failure, unspecified whether with hypoxia or hypercapnia: Secondary | ICD-10-CM | POA: Diagnosis not present

## 2018-03-13 NOTE — Telephone Encounter (Signed)
error 

## 2018-03-29 ENCOUNTER — Telehealth: Payer: Self-pay

## 2018-03-29 NOTE — Telephone Encounter (Signed)
Copied from [place] [phone]. Topic: General - Other >> Mar 29, 2018  2:36 PM Yvette Rack wrote: Reason for CRM: pt calling stating that he has an appt on the 13th to check his high white count with Dr. 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

## 2018-03-30 ENCOUNTER — Telehealth: Payer: Self-pay

## 2018-03-30 NOTE — Telephone Encounter (Signed)
Pt called for samples of Anora informed pt that we have one up front for him he will pick it up on Tuesday.

## 2018-03-30 NOTE — Telephone Encounter (Signed)
Sent to PCP ?

## 2018-03-31 NOTE — Telephone Encounter (Signed)
Please contact the patient and clarify what he needs.  Typically when the patient is following at the cancer center they ordered the labs to have them completed for the patient visit.

## 2018-04-02 ENCOUNTER — Ambulatory Visit
Admission: RE | Admit: 2018-04-02 | Discharge: 2018-04-02 | Disposition: A | Discharge status: Home / Self Care | Payer: Medicare Other | Source: Ambulatory Visit | Attending: Internal Medicine | Admitting: Internal Medicine

## 2018-04-02 DIAGNOSIS — C3412 Malignant neoplasm of upper lobe, left bronchus or lung: Secondary | ICD-10-CM | POA: Diagnosis not present

## 2018-04-03 ENCOUNTER — Encounter: Payer: Self-pay | Admitting: Internal Medicine

## 2018-04-03 ENCOUNTER — Ambulatory Visit: Payer: Medicare Other | Admitting: Internal Medicine

## 2018-04-03 VITALS — BP 120/70 | HR 84 | Resp 16 | Ht 68.5 in | Wt 145.6 lb

## 2018-04-03 DIAGNOSIS — J449 Chronic obstructive pulmonary disease, unspecified: Secondary | ICD-10-CM | POA: Diagnosis not present

## 2018-04-03 DIAGNOSIS — M069 Rheumatoid arthritis, unspecified: Secondary | ICD-10-CM

## 2018-04-03 DIAGNOSIS — R911 Solitary pulmonary nodule: Secondary | ICD-10-CM | POA: Diagnosis not present

## 2018-04-03 DIAGNOSIS — F17210 Nicotine dependence, cigarettes, uncomplicated: Secondary | ICD-10-CM

## 2018-04-03 NOTE — Telephone Encounter (Signed)
Called and spoke with patient he stated that Dr. Caryl Bis needs to contact Dr. Rogue Bussing to tell him what lb orders to placed for lab work to figure out why is WBC is leveled. I told the patient that Dr. Rogue Bussing would be the one to placed these orders not Dr. Caryl Bis. Pt does NOT seem to understand. He stated that Dr. Rogue Bussing does NOT know that his WBC is elevated. That Dr. Rogue Bussing only checking him and treating him for his iron per patient. Pt did seem to understand that Dr. Rogue Bussing was the cancer doctor. I asked him about Dr. Yong Channel and patient did seem to know who Dr. Yong Channel was.   Sent to PCP   Pt wanted these labs done before his appt on the 13th with brahmanday.

## 2018-04-03 NOTE — Telephone Encounter (Signed)
Called and spoke with patient patient advise.

## 2018-04-03 NOTE — Patient Instructions (Signed)

## 2018-04-03 NOTE — Telephone Encounter (Signed)
I will forward this phone not to Dr Rogue Bussing so he is aware of the patients elevated WBC ahead of the patients upcoming office visit.

## 2018-04-03 NOTE — Progress Notes (Signed)
[facility] [place], [place] [address]  Pulmonary Sleep Medicine   Office Visit Note  Patient Name: Peter Johnston DOB: 10-Nov-1940 MRN [id number]  Date of Service: 04/03/2018  Complaints/HPI: He is doing about the same.  He has had a significant increase in shortness of breath he continues to use oxygen however still has desaturations very easily.  States that he is not smoking cigarettes however he is vaping.  His girlfriend who lives with him also apparently smokes.  In addition he told me that he had a bout of rectal bleeding he had been drinking hard liquor and the bleeding started he states now that he has quit drinking.  He states on occasion he will still have a beer.  Patient has been on Anoro and he not really able to afford his medications he was asking about Trelegy so I gave him some samples of the Trelegy to use for his COPD.  ROS  General: (-) fever, (-) chills, (-) night sweats, (-) weakness Skin: (-) rashes, (-) itching,. Eyes: (-) visual changes, (-) redness, (-) itching. Nose and Sinuses: (-) nasal stuffiness or itchiness, (-) postnasal drip, (-) nosebleeds, (-) sinus trouble. Mouth and Throat: (-) sore throat, (-) hoarseness. Neck: (-) swollen glands, (-) enlarged thyroid, (-) neck pain. Respiratory: + cough, (-) bloody sputum, + shortness of breath, + wheezing. Cardiovascular: - ankle swelling, (-) chest pain. Lymphatic: (-) lymph node enlargement. Neurologic: (-) numbness, (-) tingling. Psychiatric: (-) anxiety, (-) depression   Current Medication: Outpatient Encounter Medications as of 04/03/2018  Medication Sig  . acetaminophen (TYLENOL) 325 MG tablet Take 650 mg by mouth 2 (two) times daily as needed for moderate pain.  Marland Kitchen albuterol (PROVENTIL HFA;VENTOLIN HFA) 108 (90 Base) MCG/ACT inhaler Inhale 2 puffs into the lungs every 6 (six) hours as needed for wheezing or shortness of breath.  . allopurinol (ZYLOPRIM) 300 MG tablet Take 300  mg by mouth daily.  Marland Kitchen atenolol (TENORMIN) 25 MG tablet TAKE 1 TABLET DAILY (Patient taking differently: Take 25 mg by mouth daily. )  . bimatoprost (LUMIGAN) 0.01 % SOLN Place 1 drop into both eyes 2 (two) times daily.  Marland Kitchen esomeprazole (NEXIUM) 20 MG capsule Take 1 capsule (20 mg total) by mouth daily before breakfast.  . fluticasone (FLONASE) 50 MCG/ACT nasal spray Place 2 sprays into both nostrils daily.  . furosemide (LASIX) 20 MG tablet Take 1 tablet by mouth daily as needed for edema.   . hydroxychloroquine (PLAQUENIL) 200 MG tablet Take by mouth.  Marland Kitchen ipratropium-albuterol (DUONEB) 0.5-2.5 (3) MG/3ML SOLN Inhale 3 mLs into the lungs every 4 (four) hours as needed. (Patient taking differently: Inhale 3 mLs into the lungs every 4 (four) hours as needed (for wheezing/shortness of breath). )  . predniSONE (DELTASONE) 10 MG tablet TAKE 1 TABLET BY MOUTH  DAILY WITH BREAKFAST (Patient taking differently: Take 10 mg by mouth daily. )  . theophylline (UNIPHYL) 400 MG 24 hr tablet TAKE 1 TABLET BY MOUTH  DAILY  . traMADol (ULTRAM) 50 MG tablet Take 50 mg by mouth daily.  . Travoprost, BAK Free, (TRAVATAN) 0.004 % SOLN ophthalmic solution Place 1 drop into both eyes at bedtime.  Marland Kitchen umeclidinium-vilanterol (ANORO ELLIPTA) 62.5-25 MCG/INH AEPB Inhale 1 puff into the lungs daily.  . pantoprazole (PROTONIX) 40 MG tablet Take 1 tablet (40 mg total) by mouth daily.   No facility-administered encounter medications on file as of 04/03/2018.     Surgical History: Past Surgical History:  Procedure  Laterality Date  . ABDOMINAL HERNIA REPAIR  1995   Dr. Rochel Brome  . HAMMER TOE SURGERY  2011, 2012   x 2- Dr. Elvina Mattes    Medical History: Past Medical History:  Diagnosis Date  . Arthritis   . Asthma   . COPD (chronic obstructive pulmonary disease) ([place])   . Hypertension   . IDA (iron deficiency anemia)   . Lung mass     Family History: Family History  Problem Relation Age of Onset  . Stroke Father    . Heart disease Father   . Hypertension Father   . Heart disease Brother 39    Social History: Social History   Socioeconomic History  . Marital status: Widowed    Spouse name: Not on file  . Number of children: 2  . Years of education: Not on file  . Highest education level: Not on file  Occupational History  . Occupation: Retired   Scientific laboratory technician  . Financial resource strain: Patient refused  . Food insecurity:    Worry: Patient refused    Inability: Patient refused  . Transportation needs:    Medical: Patient refused    Non-medical: Patient refused  Tobacco Use  . Smoking status: Former Smoker    Packs/day: 2.00    Years: 63.00    Pack years: 126.00    Types: Cigarettes    Last attempt to quit: 01/22/2014    Years since quitting: 4.1  . Smokeless tobacco: Never Used  . Tobacco comment: Stopped smoking cigarettes Sunday and uses the vapor e-cig to help with cravings  Substance and Sexual Activity  . Alcohol use: Yes    Alcohol/week: 15.0 standard drinks    Types: 15 Standard drinks or equivalent per week    Comment: 2-3 beers at night  . Drug use: No  . Sexual activity: Yes  Lifestyle  . Physical activity:    Days per week: Patient refused    Minutes per session: Patient refused  . Stress: Patient refused  Relationships  . Social connections:    Talks on phone: Patient refused    Gets together: Patient refused    Attends religious service: Patient refused    Active member of club or organization: Patient refused    Attends meetings of clubs or organizations: Patient refused    Relationship status: Patient refused  . Intimate partner violence:    Fear of current or ex partner: No    Emotionally abused: No    Physically abused: No    Forced sexual activity: No  Other Topics Concern  . Not on file  Social History Narrative   Lives in [place] with son.      2 children, 2 grandchildren             Vital Signs: Blood pressure 120/70, pulse 84,  resp. rate 16, height 5' 8.5" (1.74 m), weight 145 lb 9.6 oz (66 kg), SpO2 92 %.  Examination: General Appearance: The patient is well-developed, well-nourished, and in no distress. Skin: Gross inspection of skin unremarkable. Head: normocephalic, no gross deformities. Eyes: no gross deformities noted. ENT: ears appear grossly normal no exudates. Neck: Supple. No thyromegaly. No LAD. Respiratory: few rhonchi noted at this time. Cardiovascular: Normal S1 and S2 without murmur or rub. Extremities: No cyanosis. pulses are equal. Neurologic: Alert and oriented. No involuntary movements.  LABS: Recent Results (from the past 2160 hour(s))  HM DIABETES EYE EXAM     Status: None   Collection Time: 01/24/18  12:00 AM  Result Value Ref Range   HM Diabetic Eye Exam No Retinopathy No Retinopathy    Comment: left eye glaucoma right eye greater than left eye and fair i  Hepatitis A Ab, Total     Status: None   Collection Time: 02/07/18  1:51 PM  Result Value Ref Range   Hepatitis A AB,Total NON-REACTIVE NON-REACTI    Comment: For additional information, please refer to  http://education.quest[employer].com/faq/FAQ202  (This link is being provided for informational/ educational purposes only.)   CBC     Status: Abnormal   Collection Time: 02/07/18  1:51 PM  Result Value Ref Range   WBC 15.0 (H) 4.0 - 10.5 K/uL   RBC 4.07 (L) 4.22 - 5.81 Mil/uL   Platelets 268.0 150.0 - 400.0 K/uL   Hemoglobin 11.8 (L) 13.0 - 17.0 g/dL   HCT 36.2 (L) 39.0 - 52.0 %   MCV 89.1 78.0 - 100.0 fl   MCHC 32.6 30.0 - 36.0 g/dL   RDW 16.5 (H) 11.5 - 15.5 %  CBC with Differential/Platelet     Status: Abnormal   Collection Time: 02/16/18  2:34 PM  Result Value Ref Range   WBC 14.9 (H) 4.0 - 10.5 K/uL   RBC 4.15 (L) 4.22 - 5.81 Mil/uL   Hemoglobin 11.9 (L) 13.0 - 17.0 g/dL   HCT 37.2 (L) 39.0 - 52.0 %   MCV 89.6 78.0 - 100.0 fl   MCHC 32.1 30.0 - 36.0 g/dL   RDW 16.2 (H) 11.5 - 15.5 %   Platelets 262.0 150.0 -  400.0 K/uL   Neutrophils Relative % 92.8 (H) 43.0 - 77.0 %    Comment: A Manual Differential was performed and is consistent with the Automated Differential.   Lymphocytes Relative 3.9 (L) 12.0 - 46.0 %   Monocytes Relative 2.5 (L) 3.0 - 12.0 %   Eosinophils Relative 0.3 0.0 - 5.0 %   Basophils Relative 0.5 0.0 - 3.0 %   Neutro Abs 13.8 (H) 1.4 - 7.7 K/uL   Lymphs Abs 0.6 (L) 0.7 - 4.0 K/uL   Monocytes Absolute 0.4 0.1 - 1.0 K/uL   Eosinophils Absolute 0.0 0.0 - 0.7 K/uL   Basophils Absolute 0.1 0.0 - 0.1 K/uL  Fecal Occult Blood, Guaiac     Status: None   Collection Time: 02/19/18 12:00 AM  Result Value Ref Range   Fecal Occult Blood Positive     Comment: positive A   Fecal occult blood, imunochemical     Status: Abnormal   Collection Time: 02/19/18  9:44 AM  Result Value Ref Range   Fecal Occult Bld Positive (A) Negative  CBC w/Diff     Status: Abnormal   Collection Time: 02/26/18  3:45 PM  Result Value Ref Range   WBC 13.6 (H) 4.0 - 10.5 K/uL   RBC 4.27 4.22 - 5.81 Mil/uL   Hemoglobin 12.3 (L) 13.0 - 17.0 g/dL   HCT 38.3 (L) 39.0 - 52.0 %   MCV 89.6 78.0 - 100.0 fl   MCHC 32.0 30.0 - 36.0 g/dL   RDW 15.8 (H) 11.5 - 15.5 %   Platelets 267.0 150.0 - 400.0 K/uL   Neutrophils Relative % (H) 43.0 - 77.0 %    89.0 Specimen too old for Manual Differential to be performed.   Lymphocytes Relative 6.8 L (L) 12.0 - 46.0 %   Monocytes Relative 2.8 (L) 3.0 - 12.0 %   Eosinophils Relative 0.2 0.0 - 5.0 %   Basophils Relative 1.2  0.0 - 3.0 %   Neutro Abs 12.1 (H) 1.4 - 7.7 K/uL   Lymphs Abs 0.9 0.7 - 4.0 K/uL   Monocytes Absolute 0.4 0.1 - 1.0 K/uL   Eosinophils Absolute 0.0 0.0 - 0.7 K/uL   Basophils Absolute 0.2 (H) 0.0 - 0.1 K/uL    Radiology: Ct Chest Wo Contrast  Result Date: 04/02/2018 CLINICAL DATA:  Primary cancer of left upper lobe. On home oxygen. Cough. Status post SBRT June of 2017. EXAM: CT CHEST WITHOUT CONTRAST TECHNIQUE: Multidetector CT imaging of the chest was  performed following the standard protocol without IV contrast. COMPARISON:  09/20/2017 FINDINGS: Cardiovascular: Advanced aortic and branch vessel atherosclerosis. Borderline cardiomegaly, without pericardial effusion. Multivessel coronary artery atherosclerosis. Pulmonary artery enlargement, outflow tract 3.4 cm. Mediastinum/Nodes: No mediastinal or definite hilar adenopathy, given limitations of unenhanced CT. Lungs/Pleura: No pleural fluid. Advanced bullous type emphysema. Presumed secretions in the dependent trachea on image 31/3. Mild to moderate motion degradation inferiorly. Left upper lobe nodule measures 12 x 8 mm on image 70/3 and is similar to on the prior. The previously described 4 mm nodule within the left upper lobe is less distinct today, including on image 62/3. Inferior right middle lobe and lingular opacities are similar and likely areas of scarring. Slight increase in dependent bibasilar pulmonary opacities. Example images 137 and 146/3. Upper Abdomen: Multiple small gallstones. Normal imaged portions of the liver, spleen, stomach, pancreas, adrenal glands. Mild renal cortical thinning bilaterally. Musculoskeletal: Thoracic spondylosis and diffuse idiopathic skeletal hyperostosis. IMPRESSION: 1. Mild-to-moderate motion degradation inferiorly. 2. Dominant left upper lobe pulmonary nodule is unchanged. A smaller left upper lobe nodule is slightly less distinct today. 3. No thoracic adenopathy. 4. Aortic atherosclerosis (ICD10-I70.0), coronary artery atherosclerosis and emphysema (ICD10-J43.9). 5. Pulmonary artery enlargement suggests pulmonary arterial hypertension. 6. Increased bibasilar pulmonary opacities which most likely represent areas of increased scar and superimposed atelectasis. Given the clinical history, pneumonia or aspiration cannot be excluded. 7. Cholelithiasis. Electronically Signed   By: Abigail Miyamoto M.D.   On: 04/02/2018 14:23    Ct Chest Wo Contrast  Result Date:  04/02/2018 CLINICAL DATA:  Primary cancer of left upper lobe. On home oxygen. Cough. Status post SBRT June of 2017. EXAM: CT CHEST WITHOUT CONTRAST TECHNIQUE: Multidetector CT imaging of the chest was performed following the standard protocol without IV contrast. COMPARISON:  09/20/2017 FINDINGS: Cardiovascular: Advanced aortic and branch vessel atherosclerosis. Borderline cardiomegaly, without pericardial effusion. Multivessel coronary artery atherosclerosis. Pulmonary artery enlargement, outflow tract 3.4 cm. Mediastinum/Nodes: No mediastinal or definite hilar adenopathy, given limitations of unenhanced CT. Lungs/Pleura: No pleural fluid. Advanced bullous type emphysema. Presumed secretions in the dependent trachea on image 31/3. Mild to moderate motion degradation inferiorly. Left upper lobe nodule measures 12 x 8 mm on image 70/3 and is similar to on the prior. The previously described 4 mm nodule within the left upper lobe is less distinct today, including on image 62/3. Inferior right middle lobe and lingular opacities are similar and likely areas of scarring. Slight increase in dependent bibasilar pulmonary opacities. Example images 137 and 146/3. Upper Abdomen: Multiple small gallstones. Normal imaged portions of the liver, spleen, stomach, pancreas, adrenal glands. Mild renal cortical thinning bilaterally. Musculoskeletal: Thoracic spondylosis and diffuse idiopathic skeletal hyperostosis. IMPRESSION: 1. Mild-to-moderate motion degradation inferiorly. 2. Dominant left upper lobe pulmonary nodule is unchanged. A smaller left upper lobe nodule is slightly less distinct today. 3. No thoracic adenopathy. 4. Aortic atherosclerosis (ICD10-I70.0), coronary artery atherosclerosis and emphysema (ICD10-J43.9). 5. Pulmonary artery enlargement  suggests pulmonary arterial hypertension. 6. Increased bibasilar pulmonary opacities which most likely represent areas of increased scar and superimposed atelectasis. Given the  clinical history, pneumonia or aspiration cannot be excluded. 7. Cholelithiasis. Electronically Signed   By: Abigail Miyamoto M.D.   On: 04/02/2018 14:23    Ct Chest Wo Contrast  Result Date: 04/02/2018 CLINICAL DATA:  Primary cancer of left upper lobe. On home oxygen. Cough. Status post SBRT June of 2017. EXAM: CT CHEST WITHOUT CONTRAST TECHNIQUE: Multidetector CT imaging of the chest was performed following the standard protocol without IV contrast. COMPARISON:  09/20/2017 FINDINGS: Cardiovascular: Advanced aortic and branch vessel atherosclerosis. Borderline cardiomegaly, without pericardial effusion. Multivessel coronary artery atherosclerosis. Pulmonary artery enlargement, outflow tract 3.4 cm. Mediastinum/Nodes: No mediastinal or definite hilar adenopathy, given limitations of unenhanced CT. Lungs/Pleura: No pleural fluid. Advanced bullous type emphysema. Presumed secretions in the dependent trachea on image 31/3. Mild to moderate motion degradation inferiorly. Left upper lobe nodule measures 12 x 8 mm on image 70/3 and is similar to on the prior. The previously described 4 mm nodule within the left upper lobe is less distinct today, including on image 62/3. Inferior right middle lobe and lingular opacities are similar and likely areas of scarring. Slight increase in dependent bibasilar pulmonary opacities. Example images 137 and 146/3. Upper Abdomen: Multiple small gallstones. Normal imaged portions of the liver, spleen, stomach, pancreas, adrenal glands. Mild renal cortical thinning bilaterally. Musculoskeletal: Thoracic spondylosis and diffuse idiopathic skeletal hyperostosis. IMPRESSION: 1. Mild-to-moderate motion degradation inferiorly. 2. Dominant left upper lobe pulmonary nodule is unchanged. A smaller left upper lobe nodule is slightly less distinct today. 3. No thoracic adenopathy. 4. Aortic atherosclerosis (ICD10-I70.0), coronary artery atherosclerosis and emphysema (ICD10-J43.9). 5. Pulmonary artery  enlargement suggests pulmonary arterial hypertension. 6. Increased bibasilar pulmonary opacities which most likely represent areas of increased scar and superimposed atelectasis. Given the clinical history, pneumonia or aspiration cannot be excluded. 7. Cholelithiasis. Electronically Signed   By: Abigail Miyamoto M.D.   On: 04/02/2018 14:23      Assessment and Plan: Patient Active Problem List   Diagnosis Date Noted  . Skin lesions 02/07/2018  . GI bleed 01/01/2018  . Rectal bleeding 01/01/2018  . Weakness of both lower extremities 08/02/2017  . Interstitial lung disease ([place]) 04/25/2017  . Iron deficiency anemia due to chronic blood loss 10/17/2016  . Back pain 10/09/2016  . Lower extremity edema 10/09/2016  . Hyperlipidemia 03/03/2016  . Primary cancer of left upper lobe of lung ([place]) 11/09/2015  . Insomnia 10/16/2015  . Rheumatoid arthritis ([place]) 08/17/2015  . OP (osteoporosis) 08/17/2015  . Gout 08/17/2015  . Glaucoma 08/17/2015  . Coronary artery disease 07/22/2014  . Diarrhea 12/06/2013  . Lumbar canal stenosis 08/28/2013  . GERD (gastroesophageal reflux disease) 06/29/2011  . Iron deficiency anemia 06/29/2011  . Essential hypertension 06/07/2011  . Benign prostatic hypertrophy with urinary frequency 06/07/2011  . COPD, severe ([place]) 06/07/2011    1. COPD severe end-stage terminal disease he would be a candidate for hospice care at this point however is not currently interested in this.  The patient was given samples of trilogy at this point he needs to continue with oxygen therapy.  He is not able to perform pulmonary functions and so we will forego these. 2. LUL Nodule had a follow-up CT scan of the chest done yesterday but appears based on the CT results nodules related fairly stable he is supposed to see oncology tomorrow. 3. GERD continue with supportive care patient is  doing fine on the current management 4. RA stable follow-up with his primary care provider  General  Counseling: I have discussed the findings of the evaluation and examination with Juanda Crumble.  I have also discussed any further diagnostic evaluation thatmay be needed or ordered today. Othell verbalizes understanding of the findings of todays visit. We also reviewed his medications today and discussed drug interactions and side effects including but not limited excessive drowsiness and altered mental states. We also discussed that there is always a risk not just to him but also people around him. he has been encouraged to call the office with any questions or concerns that should arise related to todays visit.    Time spent: 54min  I have personally obtained a history, examined the patient, evaluated laboratory and imaging results, formulated the assessment and plan and placed orders.    Allyne Gee, MD [facility] Pulmonary and Critical Care Sleep medicine

## 2018-04-05 ENCOUNTER — Other Ambulatory Visit: Payer: Self-pay | Admitting: Internal Medicine

## 2018-04-05 DIAGNOSIS — C3412 Malignant neoplasm of upper lobe, left bronchus or lung: Secondary | ICD-10-CM

## 2018-04-06 NOTE — Telephone Encounter (Signed)
Thanks for reaching to me. I will follow up on pt's leucocytosis when he sees me. GB

## 2018-04-09 ENCOUNTER — Inpatient Hospital Stay: Payer: Medicare Other | Attending: Internal Medicine

## 2018-04-09 ENCOUNTER — Inpatient Hospital Stay: Payer: Medicare Other

## 2018-04-09 ENCOUNTER — Encounter: Payer: Self-pay | Admitting: Internal Medicine

## 2018-04-09 ENCOUNTER — Other Ambulatory Visit: Payer: Self-pay

## 2018-04-09 ENCOUNTER — Inpatient Hospital Stay (HOSPITAL_BASED_OUTPATIENT_CLINIC_OR_DEPARTMENT_OTHER): Payer: Medicare Other | Admitting: Internal Medicine

## 2018-04-09 VITALS — BP 122/76 | HR 82 | Temp 97.0°F | Resp 24 | Ht 68.5 in | Wt 138.0 lb

## 2018-04-09 DIAGNOSIS — R5382 Chronic fatigue, unspecified: Secondary | ICD-10-CM

## 2018-04-09 DIAGNOSIS — M129 Arthropathy, unspecified: Secondary | ICD-10-CM | POA: Diagnosis not present

## 2018-04-09 DIAGNOSIS — C3412 Malignant neoplasm of upper lobe, left bronchus or lung: Secondary | ICD-10-CM | POA: Diagnosis not present

## 2018-04-09 DIAGNOSIS — D72829 Elevated white blood cell count, unspecified: Secondary | ICD-10-CM | POA: Insufficient documentation

## 2018-04-09 DIAGNOSIS — Z79899 Other long term (current) drug therapy: Secondary | ICD-10-CM

## 2018-04-09 DIAGNOSIS — D509 Iron deficiency anemia, unspecified: Secondary | ICD-10-CM

## 2018-04-09 DIAGNOSIS — I10 Essential (primary) hypertension: Secondary | ICD-10-CM | POA: Insufficient documentation

## 2018-04-09 DIAGNOSIS — J449 Chronic obstructive pulmonary disease, unspecified: Secondary | ICD-10-CM | POA: Diagnosis not present

## 2018-04-09 DIAGNOSIS — Z7952 Long term (current) use of systemic steroids: Secondary | ICD-10-CM | POA: Diagnosis not present

## 2018-04-09 DIAGNOSIS — D500 Iron deficiency anemia secondary to blood loss (chronic): Secondary | ICD-10-CM

## 2018-04-09 DIAGNOSIS — Z87891 Personal history of nicotine dependence: Secondary | ICD-10-CM

## 2018-04-09 DIAGNOSIS — R634 Abnormal weight loss: Secondary | ICD-10-CM | POA: Insufficient documentation

## 2018-04-09 LAB — CBC WITH DIFFERENTIAL/PLATELET
ABS IMMATURE GRANULOCYTES: 0.07 10*3/uL (ref 0.00–0.07)
Basophils Absolute: 0.1 10*3/uL (ref 0.0–0.1)
Basophils Relative: 1 %
Eosinophils Absolute: 0.2 10*3/uL (ref 0.0–0.5)
Eosinophils Relative: 1 %
HEMATOCRIT: 37 % — AB (ref 39.0–52.0)
Hemoglobin: 11.5 g/dL — ABNORMAL LOW (ref 13.0–17.0)
Immature Granulocytes: 1 %
LYMPHS ABS: 1.6 10*3/uL (ref 0.7–4.0)
Lymphocytes Relative: 12 %
MCH: 28.2 pg (ref 26.0–34.0)
MCHC: 31.1 g/dL (ref 30.0–36.0)
MCV: 90.7 fL (ref 80.0–100.0)
MONO ABS: 0.9 10*3/uL (ref 0.1–1.0)
Monocytes Relative: 7 %
NEUTROS ABS: 10.6 10*3/uL — AB (ref 1.7–7.7)
NEUTROS PCT: 78 %
Platelets: 221 10*3/uL (ref 150–400)
RBC: 4.08 MIL/uL — ABNORMAL LOW (ref 4.22–5.81)
RDW: 15.7 % — ABNORMAL HIGH (ref 11.5–15.5)
WBC: 13.4 10*3/uL — ABNORMAL HIGH (ref 4.0–10.5)
nRBC: 0 % (ref 0.0–0.2)

## 2018-04-09 LAB — COMPREHENSIVE METABOLIC PANEL
ALT: 13 U/L (ref 0–44)
ANION GAP: 7 (ref 5–15)
AST: 20 U/L (ref 15–41)
Albumin: 3.2 g/dL — ABNORMAL LOW (ref 3.5–5.0)
Alkaline Phosphatase: 74 U/L (ref 38–126)
BUN: 12 mg/dL (ref 8–23)
CALCIUM: 9 mg/dL (ref 8.9–10.3)
CO2: 36 mmol/L — AB (ref 22–32)
Chloride: 99 mmol/L (ref 98–111)
Creatinine, Ser: 0.64 mg/dL (ref 0.61–1.24)
GFR calc Af Amer: >60 mL/min (ref >60)
GFR calc non Af Amer: >60 mL/min (ref >60)
GLUCOSE: 87 mg/dL (ref 70–99)
Potassium: 3.7 mmol/L (ref 3.5–5.1)
SODIUM: 142 mmol/L (ref 135–145)
TOTAL PROTEIN: 6.5 g/dL (ref 6.5–8.1)
Total Bilirubin: 0.5 mg/dL (ref 0.3–1.2)

## 2018-04-09 LAB — IRON AND TIBC
Iron: 39 ug/dL — ABNORMAL LOW (ref 45–182)
Saturation Ratios: 16 % — ABNORMAL LOW (ref 17.9–39.5)
TIBC: 239 ug/dL — ABNORMAL LOW (ref 250–450)
UIBC: 200 ug/dL

## 2018-04-09 LAB — FERRITIN: Ferritin: 97 ng/mL (ref 24–336)

## 2018-04-09 MED ORDER — SODIUM CHLORIDE 0.9 % IV SOLN
Freq: Once | INTRAVENOUS | Status: AC
  Administered 2018-04-09: 15:00:00 via INTRAVENOUS
  Filled 2018-04-09: qty 250

## 2018-04-09 MED ORDER — IRON SUCROSE 20 MG/ML IV SOLN
200.0000 mg | Freq: Once | INTRAVENOUS | Status: AC
  Administered 2018-04-09: 200 mg via INTRAVENOUS
  Filled 2018-04-09: qty 10

## 2018-04-09 NOTE — Assessment & Plan Note (Addendum)
# #   LEFT UPPER LOBE LUNG CA- STAGE I-status post SBRT June 2017.  Jan 2020-STABLE LUL nodule. STABLE.   # Iron deficiency anemia unclear etiology; today hemoglobin is 12.7 saturation 16%; proceed with IV iron today.  Stable.  # Mild Leucocytosis-multifactorial chronic COPD smoking/chronic steroids.  Would recommend any further work-up given the stability/and in general his frail physical condition.  #Chronic severe COPD-stable; question atelectasis bilateral lower lungs.  Defer to PCP/pulmonary.  #Chronic fatigue-likely second advance COPD. Stable.   # DISPOSITION: # Venofer today.  # Follow-up MD- labs- cbc/cmp in 3 months labs; possible Venofer- Dr.B

## 2018-04-09 NOTE — Progress Notes (Signed)
Peter Johnston OFFICE PROGRESS NOTE  Patient Care Team: Leone Haven, MD as PCP - General (Family Medicine) Allyne Gee, MD (Pulmonary Disease) Lollie Sails, MD as Consulting Physician (Gastroenterology)  Cancer Staging No matching staging information was found for the patient.   Oncology History   # April 2016-LUL LUNG CA [clinical; no Bx sec to high risk] LUL LUNG NODULE [8mm]; MAY 2017- 19mm; PET avid- no distant mets s/p SBRT [end of June 2017];   # NOV 22nd CT scan- Improved LUL nodule  #Iron deficiency anemia-September 2018 [poor candidate for EGD colonoscopy; Dr.Skulskie] ? Gastritis vs AV malformation  # COPD- home O2 [Dr.Saadat Khan] -------------------------------------------------------------   DIAGNOSIS: Left upper lobe non-small cell lung cancer   STAGE:  I       ;GOALS: Cure/control  CURRENT/MOST RECENT THERAPY: Surveillance.      Primary cancer of left upper lobe of lung ([place])   11/09/2015 Initial Diagnosis    Primary cancer of left upper lobe of lung ([place])       INTERVAL HISTORY:  Peter Johnston 78 y.o.  male pleasant patient above history of stage I lung cancer status post SBRT; iron deficiency anemia is here for follow-up and review the CAT scan.  Patient denies any blood in stools or black or stools.  He continues to have chronic shortness of breath and chronic cough.  Unfortunately he is currently vaping.  Is concerned about weight loss.  Chronic fatigue.  Review of Systems  Constitutional: Positive for malaise/fatigue. Negative for chills, diaphoresis, fever and weight loss.  HENT: Negative for nosebleeds and sore throat.   Eyes: Negative for double vision.  Respiratory: Positive for cough, sputum production, shortness of breath and wheezing. Negative for hemoptysis.   Cardiovascular: Negative for chest pain, palpitations, orthopnea and leg swelling.  Gastrointestinal: Negative for abdominal pain, blood in stool,  constipation, diarrhea, heartburn, melena, nausea and vomiting.  Genitourinary: Negative for dysuria, frequency and urgency.  Musculoskeletal: Positive for back pain. Negative for joint pain.  Skin: Negative.  Negative for itching and rash.  Neurological: Negative for dizziness, tingling, focal weakness, weakness and headaches.  Endo/Heme/Allergies: Does not bruise/bleed easily.  Psychiatric/Behavioral: Negative for depression. The patient is not nervous/anxious and does not have insomnia.       PAST MEDICAL HISTORY :  Past Medical History:  Diagnosis Date  . Arthritis   . Asthma   . COPD (chronic obstructive pulmonary disease) ([place])   . Hypertension   . IDA (iron deficiency anemia)   . Lung mass     PAST SURGICAL HISTORY :   Past Surgical History:  Procedure Laterality Date  . ABDOMINAL HERNIA REPAIR  1995   Dr. Rochel Brome  . HAMMER TOE SURGERY  2011, 2012   x 2- Dr. Elvina Mattes    FAMILY HISTORY :   Family History  Problem Relation Age of Onset  . Stroke Father   . Heart disease Father   . Hypertension Father   . Heart disease Brother 106    SOCIAL HISTORY:   Social History   Tobacco Use  . Smoking status: Former Smoker    Packs/day: 2.00    Years: 63.00    Pack years: 126.00    Types: Cigarettes    Last attempt to quit: 01/22/2014    Years since quitting: 4.2  . Smokeless tobacco: Never Used  . Tobacco comment: Stopped smoking cigarettes Sunday and uses the vapor e-cig to help with cravings  Substance Use  Topics  . Alcohol use: Yes    Alcohol/week: 15.0 standard drinks    Types: 15 Standard drinks or equivalent per week    Comment: 2-3 beers at night  . Drug use: No    ALLERGIES:  is allergic to tamsulosin; cefdinir; gold-containing drug products; hydrochlorothiazide; and sulfa antibiotics.  MEDICATIONS:  Current Outpatient Medications  Medication Sig Dispense Refill  . acetaminophen (TYLENOL) 500 MG tablet Take 500 mg by mouth daily.     Marland Kitchen albuterol  (PROVENTIL HFA;VENTOLIN HFA) 108 (90 Base) MCG/ACT inhaler Inhale 2 puffs into the lungs every 6 (six) hours as needed for wheezing or shortness of breath. 1 Inhaler 2  . allopurinol (ZYLOPRIM) 300 MG tablet Take 300 mg by mouth daily.    Marland Kitchen atenolol (TENORMIN) 25 MG tablet TAKE 1 TABLET DAILY (Patient taking differently: Take 25 mg by mouth daily. ) 90 tablet 3  . bimatoprost (LUMIGAN) 0.01 % SOLN Place 1 drop into both eyes 2 (two) times daily.    . fluticasone (FLONASE) 50 MCG/ACT nasal spray Place 2 sprays into both nostrils daily. 16 g 6  . furosemide (LASIX) 20 MG tablet Take 1 tablet by mouth daily as needed for edema.     . hydroxychloroquine (PLAQUENIL) 200 MG tablet Take 200 mg by mouth daily.     Marland Kitchen ipratropium-albuterol (DUONEB) 0.5-2.5 (3) MG/3ML SOLN Inhale 3 mLs into the lungs every 4 (four) hours as needed. (Patient taking differently: Inhale 3 mLs into the lungs every 4 (four) hours as needed (for wheezing/shortness of breath). ) 90 mL 3  . predniSONE (DELTASONE) 10 MG tablet TAKE 1 TABLET BY MOUTH  DAILY WITH BREAKFAST (Patient taking differently: Take 10 mg by mouth daily. ) 90 tablet 0  . theophylline (UNIPHYL) 400 MG 24 hr tablet TAKE 1 TABLET BY MOUTH  DAILY 90 tablet 1  . traMADol (ULTRAM) 50 MG tablet Take 50 mg by mouth daily.    . Travoprost, BAK Free, (TRAVATAN) 0.004 % SOLN ophthalmic solution Place 1 drop into both eyes at bedtime.    Marland Kitchen umeclidinium-vilanterol (ANORO ELLIPTA) 62.5-25 MCG/INH AEPB Inhale 1 puff into the lungs daily. 1 each 2  . esomeprazole (NEXIUM) 20 MG capsule Take 1 capsule (20 mg total) by mouth daily before breakfast. (Patient not taking: Reported on 04/09/2018) 30 capsule 2   No current facility-administered medications for this visit.     PHYSICAL EXAMINATION: ECOG PERFORMANCE STATUS: 2 - Symptomatic, <50% confined to bed  BP 122/76   Pulse 82   Temp (!) 97 F (36.1 C) (Tympanic)   Resp (!) 24   Ht 5' 8.5" (1.74 m)   Wt 138 lb (62.6 kg)    BMI 20.68 kg/m   Filed Weights   04/09/18 1357  Weight: 138 lb (62.6 kg)    Physical Exam  Constitutional: He is oriented to person, place, and time.   Accompanied by his wife he is in a wheelchair; home O2.  He appears frail.  HENT:  Head: Normocephalic and atraumatic.  Mouth/Throat: Oropharynx is clear and moist. No oropharyngeal exudate.  Eyes: Pupils are equal, round, and reactive to light.  Neck: Normal range of motion. Neck supple.  Cardiovascular: Normal rate and regular rhythm.  Pulmonary/Chest: No respiratory distress. He has no wheezes.  Decreased air entry bilaterally.    Abdominal: Soft. Bowel sounds are normal. He exhibits no distension and no mass. There is no abdominal tenderness. There is no rebound and no guarding.  Musculoskeletal: Normal range of motion.  General: No tenderness or edema.  Neurological: He is alert and oriented to person, place, and time.  Skin: Skin is warm.  Psychiatric: Affect normal.      LABORATORY DATA:  I have reviewed the data as listed    Component Value Date/Time   NA 142 04/09/2018 1233   NA 139 02/01/2012 0446   K 3.7 04/09/2018 1233   K 4.5 02/01/2012 0446   CL 99 04/09/2018 1233   CL 105 02/01/2012 0446   CO2 36 (H) 04/09/2018 1233   CO2 29 02/01/2012 0446   GLUCOSE 87 04/09/2018 1233   GLUCOSE 127 (H) 02/01/2012 0446   BUN 12 04/09/2018 1233   BUN 18 02/01/2012 0446   CREATININE 0.64 04/09/2018 1233   CREATININE 0.87 11/03/2017 1532   CALCIUM 9.0 04/09/2018 1233   CALCIUM 9.2 02/01/2012 0446   PROT 6.5 04/09/2018 1233   PROT 8.2 01/30/2012 1859   ALBUMIN 3.2 (L) 04/09/2018 1233   ALBUMIN 2.8 (L) 01/30/2012 1859   AST 20 04/09/2018 1233   AST 17 01/30/2012 1859   ALT 13 04/09/2018 1233   ALT 17 01/30/2012 1859   ALKPHOS 74 04/09/2018 1233   ALKPHOS 81 01/30/2012 1859   BILITOT 0.5 04/09/2018 1233   BILITOT 0.4 01/30/2012 1859   GFRNONAA >60 04/09/2018 1233   GFRNONAA >60 02/01/2012 0446   GFRAA >60  04/09/2018 1233   GFRAA >60 02/01/2012 0446    No results found for: SPEP, UPEP  Lab Results  Component Value Date   WBC 13.4 (H) 04/09/2018   NEUTROABS 10.6 (H) 04/09/2018   HGB 11.5 (L) 04/09/2018   HCT 37.0 (L) 04/09/2018   MCV 90.7 04/09/2018   PLT 221 04/09/2018      Chemistry      Component Value Date/Time   NA 142 04/09/2018 1233   NA 139 02/01/2012 0446   K 3.7 04/09/2018 1233   K 4.5 02/01/2012 0446   CL 99 04/09/2018 1233   CL 105 02/01/2012 0446   CO2 36 (H) 04/09/2018 1233   CO2 29 02/01/2012 0446   BUN 12 04/09/2018 1233   BUN 18 02/01/2012 0446   CREATININE 0.64 04/09/2018 1233   CREATININE 0.87 11/03/2017 1532      Component Value Date/Time   CALCIUM 9.0 04/09/2018 1233   CALCIUM 9.2 02/01/2012 0446   ALKPHOS 74 04/09/2018 1233   ALKPHOS 81 01/30/2012 1859   AST 20 04/09/2018 1233   AST 17 01/30/2012 1859   ALT 13 04/09/2018 1233   ALT 17 01/30/2012 1859   BILITOT 0.5 04/09/2018 1233   BILITOT 0.4 01/30/2012 1859       RADIOGRAPHIC STUDIES: I have personally reviewed the radiological images as listed and agreed with the findings in the report. No results found.   ASSESSMENT & PLAN:  Primary cancer of left upper lobe of lung ([place]) # # LEFT UPPER LOBE LUNG CA- STAGE I-status post SBRT June 2017.  Jan 2020-STABLE LUL nodule. STABLE.   # Iron deficiency anemia unclear etiology; today hemoglobin is 12.7 saturation 16%; proceed with IV iron today.  Stable.  # Mild Leucocytosis-multifactorial chronic COPD smoking/chronic steroids.  Would recommend any further work-up given the stability/and in general his frail physical condition.  #Chronic severe COPD-stable; question atelectasis bilateral lower lungs.  Defer to PCP/pulmonary.  #Chronic fatigue-likely second advance COPD. Stable.   # DISPOSITION: # Venofer today.  # Follow-up MD- labs- cbc/cmp in 3 months labs; possible Venofer- Dr.B  No orders of the  defined types were placed in this  encounter.  All questions were answered. The patient knows to call the clinic with any problems, questions or concerns.     Cammie Sickle, MD 04/09/2018 3:06 PM

## 2018-04-10 ENCOUNTER — Other Ambulatory Visit: Payer: Self-pay

## 2018-04-10 DIAGNOSIS — C3412 Malignant neoplasm of upper lobe, left bronchus or lung: Secondary | ICD-10-CM

## 2018-04-13 DIAGNOSIS — J9610 Chronic respiratory failure, unspecified whether with hypoxia or hypercapnia: Secondary | ICD-10-CM | POA: Diagnosis not present

## 2018-04-13 DIAGNOSIS — R0602 Shortness of breath: Secondary | ICD-10-CM | POA: Diagnosis not present

## 2018-04-13 DIAGNOSIS — J449 Chronic obstructive pulmonary disease, unspecified: Secondary | ICD-10-CM | POA: Diagnosis not present

## 2018-04-25 DIAGNOSIS — H401112 Primary open-angle glaucoma, right eye, moderate stage: Secondary | ICD-10-CM | POA: Diagnosis not present

## 2018-04-30 ENCOUNTER — Ambulatory Visit: Payer: Self-pay

## 2018-04-30 ENCOUNTER — Inpatient Hospital Stay
Admission: EM | Admit: 2018-04-30 | Discharge: 2018-05-27 | DRG: 193 | Disposition: E | Payer: Medicare Other | Attending: Internal Medicine | Admitting: Internal Medicine

## 2018-04-30 ENCOUNTER — Encounter: Payer: Self-pay | Admitting: Emergency Medicine

## 2018-04-30 ENCOUNTER — Emergency Department: Payer: Medicare Other

## 2018-04-30 ENCOUNTER — Other Ambulatory Visit: Payer: Self-pay

## 2018-04-30 DIAGNOSIS — I700 Atherosclerosis of aorta: Secondary | ICD-10-CM | POA: Diagnosis present

## 2018-04-30 DIAGNOSIS — Z85118 Personal history of other malignant neoplasm of bronchus and lung: Secondary | ICD-10-CM

## 2018-04-30 DIAGNOSIS — I255 Ischemic cardiomyopathy: Secondary | ICD-10-CM | POA: Diagnosis not present

## 2018-04-30 DIAGNOSIS — J181 Lobar pneumonia, unspecified organism: Secondary | ICD-10-CM | POA: Diagnosis not present

## 2018-04-30 DIAGNOSIS — Z87891 Personal history of nicotine dependence: Secondary | ICD-10-CM | POA: Diagnosis not present

## 2018-04-30 DIAGNOSIS — I110 Hypertensive heart disease with heart failure: Secondary | ICD-10-CM | POA: Diagnosis present

## 2018-04-30 DIAGNOSIS — I4510 Unspecified right bundle-branch block: Secondary | ICD-10-CM | POA: Diagnosis not present

## 2018-04-30 DIAGNOSIS — Z9981 Dependence on supplemental oxygen: Secondary | ICD-10-CM | POA: Diagnosis not present

## 2018-04-30 DIAGNOSIS — Z7952 Long term (current) use of systemic steroids: Secondary | ICD-10-CM

## 2018-04-30 DIAGNOSIS — J159 Unspecified bacterial pneumonia: Secondary | ICD-10-CM | POA: Diagnosis present

## 2018-04-30 DIAGNOSIS — J9602 Acute respiratory failure with hypercapnia: Secondary | ICD-10-CM | POA: Diagnosis not present

## 2018-04-30 DIAGNOSIS — J1001 Influenza due to other identified influenza virus with the same other identified influenza virus pneumonia: Principal | ICD-10-CM | POA: Diagnosis present

## 2018-04-30 DIAGNOSIS — M069 Rheumatoid arthritis, unspecified: Secondary | ICD-10-CM | POA: Diagnosis present

## 2018-04-30 DIAGNOSIS — I2721 Secondary pulmonary arterial hypertension: Secondary | ICD-10-CM | POA: Diagnosis present

## 2018-04-30 DIAGNOSIS — Z923 Personal history of irradiation: Secondary | ICD-10-CM

## 2018-04-30 DIAGNOSIS — Z823 Family history of stroke: Secondary | ICD-10-CM | POA: Diagnosis not present

## 2018-04-30 DIAGNOSIS — R0602 Shortness of breath: Secondary | ICD-10-CM

## 2018-04-30 DIAGNOSIS — Z515 Encounter for palliative care: Secondary | ICD-10-CM | POA: Diagnosis not present

## 2018-04-30 DIAGNOSIS — I340 Nonrheumatic mitral (valve) insufficiency: Secondary | ICD-10-CM | POA: Diagnosis not present

## 2018-04-30 DIAGNOSIS — I5022 Chronic systolic (congestive) heart failure: Secondary | ICD-10-CM | POA: Diagnosis present

## 2018-04-30 DIAGNOSIS — G9341 Metabolic encephalopathy: Secondary | ICD-10-CM | POA: Diagnosis present

## 2018-04-30 DIAGNOSIS — R0689 Other abnormalities of breathing: Secondary | ICD-10-CM | POA: Diagnosis not present

## 2018-04-30 DIAGNOSIS — Z7189 Other specified counseling: Secondary | ICD-10-CM

## 2018-04-30 DIAGNOSIS — Z66 Do not resuscitate: Secondary | ICD-10-CM | POA: Diagnosis not present

## 2018-04-30 DIAGNOSIS — I471 Supraventricular tachycardia: Secondary | ICD-10-CM | POA: Diagnosis not present

## 2018-04-30 DIAGNOSIS — R008 Other abnormalities of heart beat: Secondary | ICD-10-CM | POA: Diagnosis present

## 2018-04-30 DIAGNOSIS — J440 Chronic obstructive pulmonary disease with acute lower respiratory infection: Secondary | ICD-10-CM | POA: Diagnosis present

## 2018-04-30 DIAGNOSIS — J9622 Acute and chronic respiratory failure with hypercapnia: Secondary | ICD-10-CM | POA: Diagnosis present

## 2018-04-30 DIAGNOSIS — E873 Alkalosis: Secondary | ICD-10-CM | POA: Diagnosis present

## 2018-04-30 DIAGNOSIS — J9621 Acute and chronic respiratory failure with hypoxia: Secondary | ICD-10-CM | POA: Diagnosis present

## 2018-04-30 DIAGNOSIS — Z8249 Family history of ischemic heart disease and other diseases of the circulatory system: Secondary | ICD-10-CM

## 2018-04-30 DIAGNOSIS — J449 Chronic obstructive pulmonary disease, unspecified: Secondary | ICD-10-CM | POA: Diagnosis not present

## 2018-04-30 DIAGNOSIS — R54 Age-related physical debility: Secondary | ICD-10-CM | POA: Diagnosis present

## 2018-04-30 DIAGNOSIS — I10 Essential (primary) hypertension: Secondary | ICD-10-CM | POA: Diagnosis not present

## 2018-04-30 DIAGNOSIS — E876 Hypokalemia: Secondary | ICD-10-CM | POA: Diagnosis not present

## 2018-04-30 DIAGNOSIS — I429 Cardiomyopathy, unspecified: Secondary | ICD-10-CM | POA: Diagnosis not present

## 2018-04-30 DIAGNOSIS — J441 Chronic obstructive pulmonary disease with (acute) exacerbation: Secondary | ICD-10-CM | POA: Diagnosis not present

## 2018-04-30 DIAGNOSIS — I428 Other cardiomyopathies: Secondary | ICD-10-CM | POA: Diagnosis not present

## 2018-04-30 DIAGNOSIS — I420 Dilated cardiomyopathy: Secondary | ICD-10-CM | POA: Diagnosis not present

## 2018-04-30 DIAGNOSIS — I2510 Atherosclerotic heart disease of native coronary artery without angina pectoris: Secondary | ICD-10-CM | POA: Diagnosis present

## 2018-04-30 DIAGNOSIS — J849 Interstitial pulmonary disease, unspecified: Secondary | ICD-10-CM | POA: Diagnosis present

## 2018-04-30 DIAGNOSIS — J9610 Chronic respiratory failure, unspecified whether with hypoxia or hypercapnia: Secondary | ICD-10-CM | POA: Diagnosis not present

## 2018-04-30 DIAGNOSIS — I272 Pulmonary hypertension, unspecified: Secondary | ICD-10-CM | POA: Diagnosis not present

## 2018-04-30 DIAGNOSIS — G9340 Encephalopathy, unspecified: Secondary | ICD-10-CM | POA: Diagnosis not present

## 2018-04-30 DIAGNOSIS — R05 Cough: Secondary | ICD-10-CM | POA: Diagnosis not present

## 2018-04-30 LAB — CBC WITH DIFFERENTIAL/PLATELET
Abs Immature Granulocytes: 0.08 10*3/uL — ABNORMAL HIGH (ref 0.00–0.07)
Basophils Absolute: 0 10*3/uL (ref 0.0–0.1)
Basophils Relative: 0 %
Eosinophils Absolute: 0 10*3/uL (ref 0.0–0.5)
Eosinophils Relative: 0 %
HCT: 43.4 % (ref 39.0–52.0)
HEMOGLOBIN: 13.3 g/dL (ref 13.0–17.0)
Immature Granulocytes: 1 %
Lymphocytes Relative: 1 %
Lymphs Abs: 0.2 10*3/uL — ABNORMAL LOW (ref 0.7–4.0)
MCH: 28.6 pg (ref 26.0–34.0)
MCHC: 30.6 g/dL (ref 30.0–36.0)
MCV: 93.3 fL (ref 80.0–100.0)
Monocytes Absolute: 1.3 10*3/uL — ABNORMAL HIGH (ref 0.1–1.0)
Monocytes Relative: 10 %
Neutro Abs: 11.6 10*3/uL — ABNORMAL HIGH (ref 1.7–7.7)
Neutrophils Relative %: 88 %
Platelets: 236 10*3/uL (ref 150–400)
RBC: 4.65 MIL/uL (ref 4.22–5.81)
RDW: 15.2 % (ref 11.5–15.5)
WBC: 13.1 10*3/uL — ABNORMAL HIGH (ref 4.0–10.5)
nRBC: 0 % (ref 0.0–0.2)

## 2018-04-30 LAB — BASIC METABOLIC PANEL
Anion gap: 5 (ref 5–15)
BUN: 19 mg/dL (ref 8–23)
CALCIUM: 9.1 mg/dL (ref 8.9–10.3)
CO2: 38 mmol/L — AB (ref 22–32)
Chloride: 96 mmol/L — ABNORMAL LOW (ref 98–111)
Creatinine, Ser: 0.84 mg/dL (ref 0.61–1.24)
GFR calc Af Amer: >60 mL/min (ref >60)
GFR calc non Af Amer: >60 mL/min (ref >60)
Glucose, Bld: 115 mg/dL — ABNORMAL HIGH (ref 70–99)
Potassium: 4.2 mmol/L (ref 3.5–5.1)
Sodium: 139 mmol/L (ref 135–145)

## 2018-04-30 LAB — BLOOD GAS, VENOUS
Acid-Base Excess: 10.4 mmol/L — ABNORMAL HIGH (ref 0.0–2.0)
Bicarbonate: 42.3 mmol/L — ABNORMAL HIGH (ref 20.0–28.0)
O2 Saturation: 50.9 %
PO2 VEN: 33 mmHg (ref 32.0–45.0)
Patient temperature: 37
pCO2, Ven: 101 mmHg (ref 44.0–60.0)
pH, Ven: 7.23 — ABNORMAL LOW (ref 7.250–7.430)

## 2018-04-30 LAB — MRSA PCR SCREENING: MRSA by PCR: NEGATIVE

## 2018-04-30 LAB — INFLUENZA PANEL BY PCR (TYPE A & B)
Influenza A By PCR: POSITIVE — AB
Influenza B By PCR: NEGATIVE

## 2018-04-30 MED ORDER — IPRATROPIUM-ALBUTEROL 0.5-2.5 (3) MG/3ML IN SOLN: 3.0000 mL | Freq: Four times a day (QID) | RESPIRATORY_TRACT | Status: DC

## 2018-04-30 MED ORDER — ONDANSETRON HCL 4 MG/2ML IJ SOLN: 4.0000 mg | Freq: Four times a day (QID) | INTRAMUSCULAR | Status: DC | PRN

## 2018-04-30 MED ORDER — ONDANSETRON HCL 4 MG PO TABS: 4.0000 mg | ORAL_TABLET | Freq: Four times a day (QID) | ORAL | Status: DC | PRN

## 2018-04-30 MED ORDER — ACETAMINOPHEN 650 MG RE SUPP: 650.0000 mg | Freq: Four times a day (QID) | RECTAL | Status: DC | PRN

## 2018-04-30 MED ORDER — SODIUM CHLORIDE 0.9% FLUSH
3.0000 mL | Freq: Two times a day (BID) | INTRAVENOUS | Status: DC
  Administered 2018-04-30 – 2018-05-14 (×27): 3 mL via INTRAVENOUS

## 2018-04-30 MED ORDER — SODIUM CHLORIDE 0.9 % IV SOLN
100.0000 mg | Freq: Once | INTRAVENOUS | Status: AC
  Administered 2018-04-30: 100 mg via INTRAVENOUS
  Filled 2018-04-30: qty 100

## 2018-04-30 MED ORDER — ALBUTEROL SULFATE (2.5 MG/3ML) 0.083% IN NEBU
2.5000 mg | INHALATION_SOLUTION | RESPIRATORY_TRACT | Status: DC | PRN
  Filled 2018-04-30: qty 3

## 2018-04-30 MED ORDER — IPRATROPIUM-ALBUTEROL 0.5-2.5 (3) MG/3ML IN SOLN
3.0000 mL | Freq: Four times a day (QID) | RESPIRATORY_TRACT | Status: DC
  Administered 2018-04-30 – 2018-05-05 (×20): 3 mL via RESPIRATORY_TRACT
  Filled 2018-04-30 (×20): qty 3

## 2018-04-30 MED ORDER — MAGNESIUM SULFATE 2 GM/50ML IV SOLN
2.0000 g | INTRAVENOUS | Status: AC
  Administered 2018-04-30: 2 g via INTRAVENOUS
  Filled 2018-04-30: qty 50

## 2018-04-30 MED ORDER — METHYLPREDNISOLONE SODIUM SUCC 125 MG IJ SOLR
60.0000 mg | Freq: Two times a day (BID) | INTRAMUSCULAR | Status: DC
  Administered 2018-04-30 – 2018-05-02 (×4): 60 mg via INTRAVENOUS
  Filled 2018-04-30 (×4): qty 2

## 2018-04-30 MED ORDER — TRAMADOL HCL 50 MG PO TABS
50.0000 mg | ORAL_TABLET | Freq: Once | ORAL | Status: AC
  Administered 2018-04-30: 50 mg via ORAL
  Filled 2018-04-30: qty 1

## 2018-04-30 MED ORDER — POLYETHYLENE GLYCOL 3350 17 G PO PACK
17.0000 g | PACK | Freq: Every day | ORAL | Status: DC | PRN
  Administered 2018-05-01 – 2018-05-05 (×2): 17 g via ORAL
  Filled 2018-04-30 (×2): qty 1

## 2018-04-30 MED ORDER — METHYLPREDNISOLONE SODIUM SUCC 125 MG IJ SOLR
125.0000 mg | Freq: Once | INTRAMUSCULAR | Status: AC
  Administered 2018-04-30: 125 mg via INTRAVENOUS
  Filled 2018-04-30: qty 2

## 2018-04-30 MED ORDER — OSELTAMIVIR PHOSPHATE 75 MG PO CAPS
75.0000 mg | ORAL_CAPSULE | Freq: Two times a day (BID) | ORAL | Status: AC
  Administered 2018-04-30 – 2018-05-04 (×8): 75 mg via ORAL
  Filled 2018-04-30 (×8): qty 1

## 2018-04-30 MED ORDER — ACETAMINOPHEN 325 MG PO TABS
650.0000 mg | ORAL_TABLET | Freq: Four times a day (QID) | ORAL | Status: DC | PRN
  Administered 2018-05-01 – 2018-05-13 (×6): 650 mg via ORAL
  Filled 2018-04-30 (×7): qty 2

## 2018-04-30 MED ORDER — IPRATROPIUM-ALBUTEROL 0.5-2.5 (3) MG/3ML IN SOLN
3.0000 mL | Freq: Once | RESPIRATORY_TRACT | Status: AC
  Administered 2018-04-30: 3 mL via RESPIRATORY_TRACT
  Filled 2018-04-30: qty 3

## 2018-04-30 MED ORDER — DOXYCYCLINE HYCLATE 100 MG PO TABS
100.0000 mg | ORAL_TABLET | Freq: Two times a day (BID) | ORAL | Status: DC
  Administered 2018-04-30 – 2018-05-01 (×2): 100 mg via ORAL
  Filled 2018-04-30 (×2): qty 1

## 2018-04-30 MED ORDER — ENOXAPARIN SODIUM 40 MG/0.4ML ~~LOC~~ SOLN
40.0000 mg | SUBCUTANEOUS | Status: DC
  Administered 2018-04-30 – 2018-05-01 (×2): 40 mg via SUBCUTANEOUS
  Filled 2018-04-30 (×2): qty 0.4

## 2018-04-30 MED ORDER — HYDRALAZINE HCL 20 MG/ML IJ SOLN: 10.0000 mg | Freq: Four times a day (QID) | INTRAMUSCULAR | Status: DC | PRN

## 2018-04-30 MED ORDER — SODIUM CHLORIDE 0.9 % IV BOLUS
1000.0000 mL | Freq: Once | INTRAVENOUS | Status: AC
  Administered 2018-04-30: 1000 mL via INTRAVENOUS

## 2018-04-30 NOTE — ED Notes (Signed)
ED TO INPATIENT HANDOFF REPORT  ED Nurse Name and Phone #: Anderson Malta [phone]  Name/Age/Gender Peter Johnston 78 y.o. male Room/Bed: ED11A/ED11A  Code Status   Code Status: Full Code  Home/SNF/Other Home Patient oriented to: self, place, time and situation Is this baseline? Yes   Triage Complete: Triage complete  Chief Complaint Difficulty Breathing  Triage Note PT to ER via EMS from home with increased [facility] for last couple of hours.  Pt reports cough productive of green sputum for last week and family contacts that have been sick.  Pt given 3 duonebs en route and states feels some better at this time. Pt speaking in complete sentences with pauses.   Allergies Allergies  Allergen Reactions  . Tamsulosin Shortness Of Breath and Other (See Comments)    dizziness  . Cefdinir Other (See Comments)    SOB  . Gold-Containing Drug Products Other (See Comments)    Proteinuria  . Hydrochlorothiazide Other (See Comments)    BP dropped  . Sulfa Antibiotics Nausea Only    Level of Care/Admitting Diagnosis ED Disposition    ED Disposition Condition [facility]: [place] [[id number]]  Level of Care: Stepdown [14]  Diagnosis: COPD exacerbation [facility]) [[id number]]  Admitting Physician: Hillary Bow [[id number]]  Attending Physician: Hillary Bow [[id number]]  Estimated length of stay: past midnight tomorrow  Certification:: I certify this patient will need inpatient services for at least 2 midnights  PT Class (Do Not Modify): Inpatient [101]  PT Acc Code (Do Not Modify): Private [1]       Medical/Surgery History Past Medical History:  Diagnosis Date  . Arthritis   . Asthma   . COPD (chronic obstructive pulmonary disease) ([place])   . Hypertension   . IDA (iron deficiency anemia)   . Lung mass    Past Surgical History:  Procedure Laterality Date  . ABDOMINAL HERNIA REPAIR  1995   Dr. Rochel Brome  . [place]  2011, 2012   x 2-  Dr. Elvina Mattes     IV Location/Drains/Wounds Patient Lines/Drains/Airways Status   Active Line/Drains/Airways    Name:   Placement date:   Placement time:   Site:   Days:   Peripheral IV 05/24/2018 Right Antecubital   04/28/2018    1129    Antecubital   less than 1          Intake/Output Last 24 hours No intake or output data in the 24 hours ending 05/21/2018 1412  Labs/Imaging Results for orders placed or performed during the hospital encounter of 05/10/2018 (from the past 48 hour(s))  Basic metabolic panel     Status: Abnormal   Collection Time: 04/29/2018 11:31 AM  Result Value Ref Range   Sodium 139 135 - 145 mmol/L   Potassium 4.2 3.5 - 5.1 mmol/L   Chloride 96 (L) 98 - 111 mmol/L   CO2 38 (H) 22 - 32 mmol/L   Glucose, Bld 115 (H) 70 - 99 mg/dL   BUN 19 8 - 23 mg/dL   Creatinine, Ser 0.84 0.61 - 1.24 mg/dL   Calcium 9.1 8.9 - 10.3 mg/dL   GFR calc non Af Amer >60 >60 mL/min   GFR calc Af Amer >60 >60 mL/min   Anion gap 5 5 - 15    Comment: Performed at [facility], [address] [place], [place] [address]  CBC with Differential     Status: Abnormal   Collection Time: 05/10/2018 11:31 AM  Result Value Ref Range   WBC 13.1 (H) 4.0 - 10.5 K/uL    Comment: WHITE COUNT CONFIRMED ON SMEAR   RBC 4.65 4.22 - 5.81 MIL/uL   Hemoglobin 13.3 13.0 - 17.0 g/dL   HCT 43.4 39.0 - 52.0 %   MCV 93.3 80.0 - 100.0 fL   MCH 28.6 26.0 - 34.0 pg   MCHC 30.6 30.0 - 36.0 g/dL   RDW 15.2 11.5 - 15.5 %   Platelets 236 150 - 400 K/uL   nRBC 0.0 0.0 - 0.2 %   Neutrophils Relative % 88 %   Neutro Abs 11.6 (H) 1.7 - 7.7 K/uL   Lymphocytes Relative 1 %   Lymphs Abs 0.2 (L) 0.7 - 4.0 K/uL   Monocytes Relative 10 %   Monocytes Absolute 1.3 (H) 0.1 - 1.0 K/uL   Eosinophils Relative 0 %   Eosinophils Absolute 0.0 0.0 - 0.5 K/uL   Basophils Relative 0 %   Basophils Absolute 0.0 0.0 - 0.1 K/uL   Immature Granulocytes 1 %   Abs Immature Granulocytes 0.08 (H) 0.00 - 0.07 K/uL    Comment:  Performed at [facility], [address] [place], [place] [address]  Blood gas, venous     Status: Abnormal   Collection Time: 05/23/2018  1:11 PM  Result Value Ref Range   pH, Ven 7.23 (L) 7.250 - 7.430   pCO2, Ven 101 (HH) 44.0 - 60.0 mmHg    Comment: CRITICAL RESULT CALLED TO, READ BACK BY AND VERIFIED WITH: CRITICAL VALUE 05/23/2018,1325,DR. STAFFORD/FD    pO2, Ven 33.0 32.0 - 45.0 mmHg   Bicarbonate 42.3 (H) 20.0 - 28.0 mmol/L   Acid-Base Excess 10.4 (H) 0.0 - 2.0 mmol/L   O2 Saturation 50.9 %   Patient temperature 37.0    Collection site VEIN    Sample type VEIN     Comment: Performed at [facility], [address], [place], [place] [address]  Influenza panel by PCR (type A & B)     Status: Abnormal   Collection Time: 05/10/2018  1:11 PM  Result Value Ref Range   Influenza A By PCR POSITIVE (A) NEGATIVE   Influenza B By PCR NEGATIVE NEGATIVE    Comment: (NOTE) The Xpert Xpress Flu assay is intended as an aid in the diagnosis of  influenza and should not be used as a sole basis for treatment.  This  assay is FDA approved for nasopharyngeal swab specimens only. Nasal  washings and aspirates are unacceptable for Xpert Xpress Flu testing. Performed at [facility], [address], [place], [place] [address]    Dg Chest 2 View  Result Date: 05/16/2018 CLINICAL DATA:  Shortness of breath.  Productive cough. EXAM: CHEST - 2 VIEW COMPARISON:  Chest CT dated 04/02/2018 and chest x-ray dated 10/16/2015 FINDINGS: Heart size is within normal limits considering the AP technique. Aortic atherosclerosis. Severe emphysema. There is chronic marked accentuation of the interstitial markings in the left midzone and at low both lung bases, slightly progressed since 2017 but essentially unchanged since the recent CT scan. No acute bone abnormality. Chronic blunting of the posterior costophrenic angles. IMPRESSION: Severe chronic emphysema and interstitial lung disease,  essentially unchanged since the CT scan of 04/02/2018. Aortic Atherosclerosis (ICD10-I70.0) and Emphysema (ICD10-J43.9). Electronically Signed   By: Lorriane Shire M.D.   On: 05/21/2018 12:03    Pending Labs [employer] (From admission, onward)    Start     Ordered  05/07/18 0500  Creatinine, serum  (enoxaparin (LOVENOX)    CrCl >/= 30 ml/min)  Weekly,   STAT    Comments:  while on enoxaparin therapy    04/28/2018 1401   05/01/18 5170  Basic metabolic panel  Tomorrow morning,   STAT     05/19/2018 1401   05/01/18 0500  CBC  Tomorrow morning,   STAT     05/13/2018 1401          Vitals/Pain Today's Vitals   05/07/2018 1332 04/29/2018 1342 05/05/2018 1400 05/23/2018 1412  BP: 118/80  126/68   Pulse: (!) 108 (!) 108 100   Resp: (!) 27  (!) 27   Temp:      SpO2: 92% 93% 94%   Weight:      Height:      PainSc:    2     Isolation Precautions Droplet precaution  Medications Medications  doxycycline (VIBRAMYCIN) 100 mg in sodium chloride 0.9 % 250 mL IVPB (100 mg Intravenous New Bag/Given 05/07/2018 1412)  enoxaparin (LOVENOX) injection 40 mg (has no administration in time range)  sodium chloride flush (NS) 0.9 % injection 3 mL (has no administration in time range)  acetaminophen (TYLENOL) tablet 650 mg (has no administration in time range)    Or  acetaminophen (TYLENOL) suppository 650 mg (has no administration in time range)  polyethylene glycol (MIRALAX / GLYCOLAX) packet 17 g (has no administration in time range)  ondansetron (ZOFRAN) tablet 4 mg (has no administration in time range)    Or  ondansetron (ZOFRAN) injection 4 mg (has no administration in time range)  albuterol (PROVENTIL) (2.5 MG/3ML) 0.083% nebulizer solution 2.5 mg (has no administration in time range)  ipratropium-albuterol (DUONEB) 0.5-2.5 (3) MG/3ML nebulizer solution 3 mL (has no administration in time range)  methylPREDNISolone sodium succinate (SOLU-MEDROL) 125 mg/2 mL injection 60 mg (has no administration in  time range)  doxycycline (VIBRA-TABS) tablet 100 mg (has no administration in time range)  methylPREDNISolone sodium succinate (SOLU-MEDROL) 125 mg/2 mL injection 125 mg (125 mg Intravenous Given 04/29/2018 1300)  traMADol (ULTRAM) tablet 50 mg (50 mg Oral Given 05/13/2018 1300)  ipratropium-albuterol (DUONEB) 0.5-2.5 (3) MG/3ML nebulizer solution 3 mL (3 mLs Nebulization Given 05/15/2018 1300)  magnesium sulfate IVPB 2 g 50 mL (0 g Intravenous Stopped 05/22/2018 1410)  sodium chloride 0.9 % bolus 1,000 mL (0 mLs Intravenous Stopped 05/22/2018 1410)    Mobility walks Moderate fall risk   Focused Assessments Pulmonary Assessment Handoff:  Lung sounds: Bilateral Breath Sounds: Expiratory wheezes, Diminished L Breath Sounds: Diminished, Expiratory wheezes R Breath Sounds: Diminished, Expiratory wheezes O2 Device: Nasal Cannula O2 Flow Rate (L/min): 3 L/min      Recommendations: See Admitting Provider Note  Report given to:   Additional Notes:

## 2018-04-30 NOTE — ED Triage Notes (Signed)
PT to ER via EMS from home with increased [facility] for last couple of hours.  Pt reports cough productive of green sputum for last week and family contacts that have been sick.  Pt given 3 duonebs en route and states feels some better at this time. Pt speaking in complete sentences with pauses.

## 2018-04-30 NOTE — Telephone Encounter (Addendum)
Patient's girlfriend [name] called and says the patient is having SOB and wants an antibiotic and steroids called in. I asked to speak to the patient, she says that he can't speak very well because of the SOB. She put the patient on the phone. I was talking to the patient and could hear the congestion on the phone and the difficulty breathing the patient is experiencing. He was able to talk, but was stopping after a few words. I asked him is he on oxygen, he says "yes, 4 L." I asked him to check his level, he told his son to check it and his son got on the phone and told me it was 78% on 4L oxygen. I advised the son he will need to call 911 to take him to the hospital, he says that he could take him in the car, then put the patient on the phone. I advised the patient of the need to call 911, the patient says "it will cost me $500 and I don't have that to pay." I advised to let the son take him in the car, he says "I can't make it to the car." I again advised to call 911, he says "I will call them later. Can't I just get some steroids?" I advised Dr. Caryl Bis will not give steroids without an appointment and the way he's sounding, Dr. Caryl Bis will agree that calling 911 is the best option at this point. I asked to speak to [name] again, I advised her of the need to call 911, she says "he will not go. That's the problem we're having with him." I told her I will call the office so he can hear it from Dr. Ellen Henri nurse. I attempted to call the office and the phone kept ringing. I hung up from the office and advised [name] that the patient really needs to go to the hospital because he is not moving adequate air with his oxygen level at 78%, he sounds really congested and will possibly need a long breathing treatment, IV steroids, possibly IV Lasix to get rid of fluid and a good emergency room workup, she says "please tell him all of this," the patient was back on the phone and I advised him of the above  what was told to [place]. The patient says "ok, I'll call them." I advised I could call and connect 911 to him, he agreed, I called 911 and the call was connected to the patient. Unable to ask questions on protocol due to the severity of the patient's symptoms.  Reason for Disposition . Severe difficulty breathing (e.g., struggling for each breath, speaks in single words)  Protocols used: BREATHING DIFFICULTY-A-AH

## 2018-04-30 NOTE — ED Provider Notes (Signed)
[facility] Emergency Department Provider Note  ____________________________________________  Time seen: Approximately 1:27 PM  I have reviewed the triage vital signs and the nursing notes.   HISTORY  Chief Complaint Shortness of Breath    Level 5 Caveat: Portions of the History and Physical including HPI and review of systems are unable to be completely obtained due to patient being a poor historian due to confusion  HPI Peter Johnston is a 78 y.o. male with a history of COPD on 3 L nasal cannula at all times, lung mass and hypertension who comes the ED due to shortness of breath today along with productive cough.  Family report that his son who lives with him had a viral upper respiratory illness over the past several days and is suspicious that he may have passed that illness onto this patient.  Patient denies any chest pain.  He has shortness of breath without aggravating or alleviating factors.      Past Medical History:  Diagnosis Date  . Arthritis   . Asthma   . COPD (chronic obstructive pulmonary disease) ([place])   . Hypertension   . IDA (iron deficiency anemia)   . Lung mass      Patient Active Problem List   Diagnosis Date Noted  . COPD exacerbation ([place]) 05/12/2018  . Skin lesions 02/07/2018  . GI bleed 01/01/2018  . Rectal bleeding 01/01/2018  . Weakness of both lower extremities 08/02/2017  . Interstitial lung disease ([place]) 04/25/2017  . Iron deficiency anemia due to chronic blood loss 10/17/2016  . Back pain 10/09/2016  . Lower extremity edema 10/09/2016  . Hyperlipidemia 03/03/2016  . Primary cancer of left upper lobe of lung ([place]) 11/09/2015  . Insomnia 10/16/2015  . Rheumatoid arthritis ([place]) 08/17/2015  . OP (osteoporosis) 08/17/2015  . Gout 08/17/2015  . Glaucoma 08/17/2015  . Coronary artery disease 07/22/2014  . Diarrhea 12/06/2013  . Lumbar canal stenosis 08/28/2013  . GERD (gastroesophageal reflux disease) 06/29/2011   . Iron deficiency anemia 06/29/2011  . Essential hypertension 06/07/2011  . Benign prostatic hypertrophy with urinary frequency 06/07/2011  . COPD, severe ([place]) 06/07/2011     Past Surgical History:  Procedure Laterality Date  . ABDOMINAL HERNIA REPAIR  1995   Dr. Rochel Brome  . HAMMER TOE SURGERY  2011, 2012   x 2- Dr. Elvina Mattes     Prior to Admission medications   Medication Sig Start Date End Date Taking? Authorizing Provider  acetaminophen (TYLENOL) 500 MG tablet Take 500 mg by mouth daily.     [provider]  albuterol (PROVENTIL HFA;VENTOLIN HFA) 108 (90 Base) MCG/ACT inhaler Inhale 2 puffs into the lungs every 6 (six) hours as needed for wheezing or shortness of breath. 12/05/16   Cammie Sickle, MD  allopurinol (ZYLOPRIM) 300 MG tablet Take 300 mg by mouth daily.    [provider]  atenolol (TENORMIN) 25 MG tablet TAKE 1 TABLET DAILY Patient taking differently: Take 25 mg by mouth daily.  01/09/17   Leone Haven, MD  bimatoprost (LUMIGAN) 0.01 % SOLN Place 1 drop into both eyes 2 (two) times daily.    [provider]  esomeprazole (NEXIUM) 20 MG capsule Take 1 capsule (20 mg total) by mouth daily before breakfast. Patient not taking: Reported on 04/09/2018 02/07/18   Leone Haven, MD  fluticasone [facility]) 50 MCG/ACT nasal spray Place 2 sprays into both nostrils daily. 02/07/18   Leone Haven, MD  furosemide (LASIX) 20 MG  tablet Take 1 tablet by mouth daily as needed for edema.     [provider]  hydroxychloroquine (PLAQUENIL) 200 MG tablet Take 200 mg by mouth daily.  01/19/18 01/19/19  [provider]  ipratropium-albuterol (DUONEB) 0.5-2.5 (3) MG/3ML SOLN Inhale 3 mLs into the lungs every 4 (four) hours as needed. Patient taking differently: Inhale 3 mLs into the lungs every 4 (four) hours as needed (for wheezing/shortness of breath).  11/30/16   Coral Spikes, DO  predniSONE (DELTASONE) 10 MG tablet TAKE  1 TABLET BY MOUTH  DAILY WITH BREAKFAST Patient taking differently: Take 10 mg by mouth daily.  01/01/18   Allyne Gee, MD  theophylline (UNIPHYL) 400 MG 24 hr tablet TAKE 1 TABLET BY MOUTH  DAILY 01/29/18   Allyne Gee, MD  traMADol (ULTRAM) 50 MG tablet Take 50 mg by mouth daily. 06/15/17   [provider]  Travoprost, BAK Free, (TRAVATAN) 0.004 % SOLN ophthalmic solution Place 1 drop into both eyes at bedtime.    [provider]  umeclidinium-vilanterol (ANORO ELLIPTA) 62.5-25 MCG/INH AEPB Inhale 1 puff into the lungs daily. 01/30/18   Kendell Bane, NP     Allergies Tamsulosin; Cefdinir; Gold-containing drug products; Hydrochlorothiazide; and Sulfa antibiotics   Family History  Problem Relation Age of Onset  . Stroke Father   . Heart disease Father   . Hypertension Father   . Heart disease Brother 55    Social History Social History   Tobacco Use  . Smoking status: Former Smoker    Packs/day: 2.00    Years: 63.00    Pack years: 126.00    Types: Cigarettes    Last attempt to quit: 01/22/2014    Years since quitting: 4.2  . Smokeless tobacco: Never Used  . Tobacco comment: Stopped smoking cigarettes Sunday and uses the vapor e-cig to help with cravings  Substance Use Topics  . Alcohol use: Yes    Alcohol/week: 15.0 standard drinks    Types: 15 Standard drinks or equivalent per week    Comment: 2-3 beers at night  . Drug use: No    Review of Systems Level 5 Caveat: Portions of the History and Physical including HPI and review of systems are unable to be completely obtained due to patient being a poor historian   Constitutional:   No known fever.  ENT:   No rhinorrhea. Cardiovascular:   No chest pain or syncope. Respiratory:   Positive shortness of breath and cough. Gastrointestinal:   Negative for abdominal pain, vomiting and diarrhea.  Musculoskeletal:   Negative for focal pain or  swelling ____________________________________________   PHYSICAL EXAM:  VITAL SIGNS: ED Triage Vitals  Enc Vitals Group     BP 05/11/2018 1123 119/72     Pulse Rate 05/10/2018 1123 (!) 117     Resp 05/06/2018 1123 (!) 24     Temp 05/25/2018 1123 98.3 F (36.8 C)     Temp src --      SpO2 05/03/2018 1122 99 %     Weight 05/16/2018 1125 145 lb (65.8 kg)     Height 05/18/2018 1125 5\' 8"  (1.727 m)     Head Circumference --      Peak Flow --      Pain Score 05/09/2018 1125 0     Pain Loc --      Pain Edu? --      Excl. in [place]? --     Vital signs reviewed, nursing assessments reviewed.  Constitutional:   Alert and oriented.  Ill-appearing Eyes:   Conjunctivae are normal. EOMI. PERRL. ENT      Head:   Normocephalic and atraumatic.      Nose:   No congestion/rhinnorhea.       Mouth/Throat:   Mucous membranes, no pharyngeal erythema. No peritonsillar mass.       Neck:   No meningismus. Full ROM. Hematological/Lymphatic/Immunilogical:   No cervical lymphadenopathy. Cardiovascular:   Tachycardia heart rate 115. Symmetric bilateral radial and DP pulses.  No murmurs. Cap refill less than 2 seconds. Respiratory: Tachypnea with accessory muscle use.  Very poor air movement on inspiration and expiration.  Diffuse inspiratory crackles at bases.  Diffuse wheezing on expiration. Gastrointestinal:   Soft and nontender. Non distended. There is no CVA tenderness.  No rebound, rigidity, or guarding. Musculoskeletal:   Normal range of motion in all extremities. No joint effusions.  No lower extremity tenderness.  No edema. Neurologic:   Normal speech and language.  Motor grossly intact. No acute focal neurologic deficits are appreciated.  Skin:    Skin is warm, dry and intact. No rash noted.  No petechiae, purpura, or bullae.  ____________________________________________    LABS (pertinent positives/negatives) (all labs ordered are listed, but only abnormal results are displayed) Labs Reviewed  BASIC  METABOLIC PANEL - Abnormal; Notable for the following components:      Result Value   Chloride 96 (*)    CO2 38 (*)    Glucose, Bld 115 (*)    All other components within normal limits  CBC WITH DIFFERENTIAL/PLATELET - Abnormal; Notable for the following components:   WBC 13.1 (*)    Neutro Abs 11.6 (*)    Lymphs Abs 0.2 (*)    Monocytes Absolute 1.3 (*)    Abs Immature Granulocytes 0.08 (*)    All other components within normal limits  BLOOD GAS, VENOUS - Abnormal; Notable for the following components:   pH, Ven 7.23 (*)    pCO2, Ven 101 (*)    Bicarbonate 42.3 (*)    Acid-Base Excess 10.4 (*)    All other components within normal limits  INFLUENZA PANEL BY PCR (TYPE A & B) - Abnormal; Notable for the following components:   Influenza A By PCR POSITIVE (*)    All other components within normal limits   ____________________________________________   EKG  Interpreted by me Sinus tachycardia rate 115, left axis, prolonged QTC, right bundle branch block.  No acute ischemic changes.  ____________________________________________    RADIOLOGY  Dg Chest 2 View  Result Date: 05/12/2018 CLINICAL DATA:  Shortness of breath.  Productive cough. EXAM: CHEST - 2 VIEW COMPARISON:  Chest CT dated 04/02/2018 and chest x-ray dated 10/16/2015 FINDINGS: Heart size is within normal limits considering the AP technique. Aortic atherosclerosis. Severe emphysema. There is chronic marked accentuation of the interstitial markings in the left midzone and at low both lung bases, slightly progressed since 2017 but essentially unchanged since the recent CT scan. No acute bone abnormality. Chronic blunting of the posterior costophrenic angles. IMPRESSION: Severe chronic emphysema and interstitial lung disease, essentially unchanged since the CT scan of 04/02/2018. Aortic Atherosclerosis (ICD10-I70.0) and Emphysema (ICD10-J43.9). Electronically Signed   By: Lorriane Shire M.D.   On: 05/17/2018 12:03     ____________________________________________   PROCEDURES .Critical Care Performed by: Carrie Mew, MD Authorized by: Carrie Mew, MD   Critical care provider statement:    Critical care time (minutes):  35   Critical care time  was exclusive of:  Separately billable procedures and treating other patients   Critical care was necessary to treat or prevent imminent or life-threatening deterioration of the following conditions:  Respiratory failure   Critical care was time spent personally by me on the following activities:  Development of treatment plan with patient or surrogate, discussions with consultants, evaluation of patient's response to treatment, examination of patient, obtaining history from patient or surrogate, ordering and performing treatments and interventions, ordering and review of laboratory studies, ordering and review of radiographic studies, pulse oximetry, re-evaluation of patient's condition and review of old charts    ____________________________________________    CLINICAL IMPRESSION / [place] / ED COURSE  Pertinent labs & imaging results that were available during my care of the patient were reviewed by me and considered in my medical decision making (see chart for details).    Patient presents with COPD exacerbation and increased work of breathing.  He was given 3 nebulizer treatments by EMS on route, with inadequate improvement in his respiratory function and symptoms.  VBG also shows acute on chronic respiratory acidosis with a PCO2 of 100 and pH of 7.2.  Ordered patient Solu-Medrol, magnesium, repeat nebs, plan to trial BiPAP for a short period of time to help remove excess carbon dioxide.  Case discussed with hospitalist for further management.  Chest x-ray does not show any infiltrate.  Doubt sepsis.  Doubt pulmonary embolism , ACS, or dissection.      ____________________________________________   FINAL CLINICAL  IMPRESSION(S) / ED DIAGNOSES    Final diagnoses:  COPD exacerbation ([place])  Acute on chronic respiratory failure with hypoxia and hypercapnia [facility])     ED Discharge Orders    None      Portions of this note were generated with dragon dictation software. Dictation errors may occur despite best attempts at proofreading.   Carrie Mew, MD 05/05/2018 [phone]

## 2018-04-30 NOTE — H&P (Signed)
[place] [facility] [place] NAME: Peter Johnston    MR#:  [id number]  DATE OF BIRTH:  1941/02/02  DATE OF ADMISSION:  05/22/2018  PRIMARY CARE PHYSICIAN: Leone Haven, MD   REQUESTING/REFERRING PHYSICIAN: Dr. Joni Fears  CHIEF COMPLAINT:   Chief Complaint  Patient presents with  . Shortness of Breath    HISTORY OF PRESENT ILLNESS:  Peter Johnston  is a 78 y.o. male with a known history of lung cancer, COPD, chronic respiratory failure on status oxygen, hypertension presents to the emergency room due to worsening shortness of breath over the last 2 days with wheezing and green sputum.  He lives with his son who recently went through a cold and he thinks his cold effected him.  Here in the emergency room patient is awake and talking but falls back to sleep easily.  Found to have PCO2 of 101 with pH of 7.3.  He is being started on BiPAP.  X-ray shows severe emphysema but no infiltrates.  Afebrile. His pulmonologist outpatient is Dr. Humphrey Rolls.  PAST MEDICAL HISTORY:   Past Medical History:  Diagnosis Date  . Arthritis   . Asthma   . COPD (chronic obstructive pulmonary disease) ([place])   . Hypertension   . IDA (iron deficiency anemia)   . Lung mass     PAST SURGICAL HISTORY:   Past Surgical History:  Procedure Laterality Date  . ABDOMINAL HERNIA REPAIR  1995   Dr. Rochel Brome  . [place]  2011, 2012   x 2- Dr. Elvina Mattes    SOCIAL HISTORY:   Social History   Tobacco Use  . Smoking status: Former Smoker    Packs/day: 2.00    Years: 63.00    Pack years: 126.00    Types: Cigarettes    Last attempt to quit: 01/22/2014    Years since quitting: 4.2  . Smokeless tobacco: Never Used  . Tobacco comment: Stopped smoking cigarettes Sunday and uses the vapor e-cig to help with cravings  Substance Use Topics  . Alcohol use: Yes    Alcohol/week: 15.0 standard drinks    Types: 15 Standard drinks or equivalent per week    Comment: 2-3  beers at night    FAMILY HISTORY:   Family History  Problem Relation Age of Onset  . Stroke Father   . Heart disease Father   . Hypertension Father   . Heart disease Brother 36    DRUG ALLERGIES:   Allergies  Allergen Reactions  . Tamsulosin Shortness Of Breath and Other (See Comments)    dizziness  . Cefdinir Other (See Comments)    SOB  . Gold-Containing Drug Products Other (See Comments)    Proteinuria  . Hydrochlorothiazide Other (See Comments)    BP dropped  . Sulfa Antibiotics Nausea Only    REVIEW OF SYSTEMS:   Review of Systems  Unable to perform ROS: Mental status change   MEDICATIONS AT HOME:   Prior to Admission medications   Medication Sig Start Date End Date Taking? Authorizing Provider  acetaminophen (TYLENOL) 500 MG tablet Take 500 mg by mouth daily.    Yes [provider]  allopurinol (ZYLOPRIM) 300 MG tablet Take 300 mg by mouth daily.   Yes [provider]  atenolol (TENORMIN) 25 MG tablet TAKE 1 TABLET DAILY Patient taking differently: Take 12.5 mg by mouth daily.  01/09/17  Yes Leone Haven, MD  bimatoprost (LUMIGAN) 0.01 % SOLN Place 1 drop into  both eyes 2 (two) times daily.   Yes [provider]  esomeprazole (NEXIUM) 20 MG capsule Take 1 capsule (20 mg total) by mouth daily before breakfast. 02/07/18  Yes Leone Haven, MD  hydroxychloroquine (PLAQUENIL) 200 MG tablet Take 200 mg by mouth daily.  01/19/18 01/19/19 Yes [provider]  ipratropium-albuterol (DUONEB) 0.5-2.5 (3) MG/3ML SOLN Inhale 3 mLs into the lungs every 4 (four) hours as needed. Patient taking differently: Inhale 3 mLs into the lungs every 4 (four) hours as needed (for wheezing/shortness of breath).  11/30/16  Yes Cook, Jayce G, DO  predniSONE (DELTASONE) 10 MG tablet TAKE 1 TABLET BY MOUTH  DAILY WITH BREAKFAST Patient taking differently: Take 10 mg by mouth daily.  01/01/18  Yes Allyne Gee, MD  theophylline (UNIPHYL) 400 MG 24  hr tablet TAKE 1 TABLET BY MOUTH  DAILY 01/29/18  Yes Allyne Gee, MD  traMADol (ULTRAM) 50 MG tablet Take 50 mg by mouth daily. 06/15/17  Yes [provider]  Travoprost, BAK Free, (TRAVATAN) 0.004 % SOLN ophthalmic solution Place 1 drop into both eyes at bedtime.   Yes [provider]  triamcinolone cream (KENALOG) 0.5 % Apply 1 application topically 2 (two) times daily. 01/15/18  Yes [provider]  umeclidinium-vilanterol (ANORO ELLIPTA) 62.5-25 MCG/INH AEPB Inhale 1 puff into the lungs daily. 01/30/18  Yes [place], Audie Clear, NP  albuterol (PROVENTIL HFA;VENTOLIN HFA) 108 (90 Base) MCG/ACT inhaler Inhale 2 puffs into the lungs every 6 (six) hours as needed for wheezing or shortness of breath. 12/05/16   Cammie Sickle, MD  fluticasone (FLONASE) 50 MCG/ACT nasal spray Place 2 sprays into both nostrils daily. 02/07/18   Leone Haven, MD  furosemide (LASIX) 20 MG tablet Take 1 tablet by mouth daily as needed for edema.     [provider]     VITAL SIGNS:  Blood pressure 126/68, pulse 100, temperature 98.3 F (36.8 C), resp. rate (!) 27, height 5\' 8"  (1.727 m), weight 65.8 kg, SpO2 94 %.  PHYSICAL EXAMINATION:  Physical Exam  GENERAL:  78 y.o.-year-old patient lying in the bed, with respiratory distress.  Looks critically ill.  Obese EYES: Pupils equal, round, reactive to light and accommodation. No scleral icterus. Extraocular muscles intact.  HEENT: Head atraumatic, normocephalic. Oropharynx and nasopharynx clear. No oropharyngeal erythema, moist oral mucosa  NECK:  Supple, no jugular venous distention. No thyroid enlargement, no tenderness.  LUNGS: Decreased breath sounds bilaterally with wheezing CARDIOVASCULAR: S1, S2 normal. No murmurs, rubs, or gallops.  ABDOMEN: Soft, nontender, nondistended. Bowel sounds present. No organomegaly or mass.  EXTREMITIES: No pedal edema, cyanosis, or clubbing. + 2 pedal & radial pulses b/l.   NEUROLOGIC:  Cranial nerves II through XII are intact. No focal Motor or sensory deficits appreciated b/l PSYCHIATRIC: The patient is drowsy SKIN: No obvious rash, lesion, or ulcer.   LABORATORY PANEL:   CBC Recent Labs  Lab 05/19/2018 1131  WBC 13.1*  HGB 13.3  HCT 43.4  PLT 236   ------------------------------------------------------------------------------------------------------------------  Chemistries  Recent Labs  Lab 05/15/2018 1131  NA 139  K 4.2  CL 96*  CO2 38*  GLUCOSE 115*  BUN 19  CREATININE 0.84  CALCIUM 9.1   ------------------------------------------------------------------------------------------------------------------  Cardiac Enzymes No results for input(s): TROPONINI in the last 168 hours. ------------------------------------------------------------------------------------------------------------------  RADIOLOGY:  Dg Chest 2 View  Result Date: 05/17/2018 CLINICAL DATA:  Shortness of breath.  Productive cough. EXAM: CHEST - 2 VIEW COMPARISON:  Chest CT  dated 04/02/2018 and chest x-ray dated 10/16/2015 FINDINGS: Heart size is within normal limits considering the AP technique. Aortic atherosclerosis. Severe emphysema. There is chronic marked accentuation of the interstitial markings in the left midzone and at low both lung bases, slightly progressed since 2017 but essentially unchanged since the recent CT scan. No acute bone abnormality. Chronic blunting of the posterior costophrenic angles. IMPRESSION: Severe chronic emphysema and interstitial lung disease, essentially unchanged since the CT scan of 04/02/2018. Aortic Atherosclerosis (ICD10-I70.0) and Emphysema (ICD10-J43.9). Electronically Signed   By: Lorriane Shire M.D.   On: 05/20/2018 12:03     IMPRESSION AND PLAN:   *Acute COPD exacerbation with acute hypoxic and hypercarbic respiratory failure with associated acute metabolic encephalopathy due to elevated PCO2 -IV steroids, Antibiotics - Scheduled  Nebulizers - Inhalers -Wean off BiPAP as tolerated -Will need intubation if any worsening - Consult pulmonary   *Lung cancer.  Follows with Dr. Bradd Canary at the cancer center  *Hypertension.  Continue home medications once able to take pills.  Ordered IV hydralazine as needed  *DVT prophylaxis with Lovenox  All the records are reviewed and case discussed with ED provider. Management plans discussed with the patient, family and they are in agreement.  CODE STATUS: Full code  Patient is awake and alert.  Able to make medical decisions.  Tells me his healthcare power of attorney is his son at bedside.  Lives with him.  We discussed regarding his COPD, worsening respiratory status on BiPAP.  CODE STATUS discussed and patient wants to be full code with aggressive care at this time.  Patient and son understand critical nature of illness with high risk of complications.  TOTAL TIME TAKING CARE OF THIS PATIENT: 80 minutes.   Leia Alf Latrelle Fuston M.D on 05/17/2018 at 2:23 PM  Between 7am to 6pm - Pager - [phone]  After 6pm go to [email or web address] - password [email or web address] [place] [facility]  Office  [phone]  CC: Primary care physician; Leone Haven, MD  Note: This dictation was prepared with Dragon dictation along with smaller phrase technology. Any transcriptional errors that result from this process are unintentional.

## 2018-04-30 NOTE — Consult Note (Signed)
CRITICAL CARE NOTE  CC  Severe respiratory failure   History of present illness: Patient 78 year old with a history of COPD history of lung CA followed by Dr. Yancey Flemings outpatient for pulmonary.  Patient unable to give a history due to acute illness and details of case history was provided by sister at bedside Dorothy Puffer.  Patient was apparently with his son who had the flu last weekend started to feel similar symptoms including severe shortness of breath dyspnea with increased high-volume phlegm production.  He subsequently called EMS due to severe respiratory failure.  In the ED was severely hypercarbic and acidemic.  Patient was found to be positive for influenza type A.      SIGNIFICANT EVENTS  -    BP (!) 128/92   Pulse 95   Temp (!) 97.5 F (36.4 C) (Oral)   Resp (!) 22   Ht 5\' 8"  (1.727 m)   Wt 64.6 kg   SpO2 93%   BMI 21.65 kg/m    REVIEW OF SYSTEMS  PATIENT IS UNABLE TO PROVIDE COMPLETE REVIEW OF SYSTEMS DUE TO SEVERE CRITICAL ILLNESS   PHYSICAL EXAMINATION:  GENERAL:critically ill appearing, +resp distress HEAD: Normocephalic, atraumatic.  EYES: Pupils equal, round, reactive to light.  No scleral icterus.  MOUTH: Moist mucosal membrane. NECK: Supple. No thyromegaly. No nodules. No JVD.  PULMONARY: +rhonchi, +wheezing CARDIOVASCULAR: S1 and S2. Regular rate and rhythm. No murmurs, rubs, or gallops.  GASTROINTESTINAL: Soft, nontender, -distended. No masses. Positive bowel sounds. No hepatosplenomegaly.  MUSCULOSKELETAL: No swelling, clubbing, or edema.  NEUROLOGIC: obtunded, GCS<8 SKIN:intact,warm,dry      Indwelling Urinary Catheter continued, requirement due to   Reason to continue Indwelling Urinary Catheter for strict Intake/Output monitoring for hemodynamic instability   Central Line continued, requirement due to   Reason to continue [employer] Monitoring of central venous pressure or other hemodynamic parameters   Ventilator continued, requirement  due to, resp failure    Ventilator Sedation RASS 0 to -2     ASSESSMENT AND PLAN SYNOPSIS   Severe Hypoxic and Hypercapnic Respiratory Failure -Due to influenza A infection -On Tamiflu -continue BiPAP overnight -continue Bronchodilator Therapy -Close monitoring for secondary bacterial pneumonia   Acute COPD exacerbation -Agree with doxycycline twice daily as well as Solu-Medrol 60 every 12/DuoNebs every 6 -Bibasilar atelectasis-sent spirometer and vest therapy -Pulmonary arterial hypertension and pulmonary nodules on outpatient with Dr. Chancy Milroy pulmonary    CARDIAC ICU monitoring  ID -follow up cultures  GI/Nutrition GI PROPHYLAXIS as indicated DIET-->TF's as tolerated Constipation protocol as indicated  ENDO - ICU hypoglycemic\Hyperglycemia protocol -check FSBS per protocol   ELECTROLYTES -follow labs as needed -replace as needed -pharmacy consultation and following   DVT/GI PRX ordered TRANSFUSIONS AS NEEDED MONITOR FSBS ASSESS the need for LABS as needed   Critical Care Time devoted to patient care services described in this note is 42 minutes.     Overall, patient is critically ill, prognosis is guarded.  Patient with Multiorgan failure and at high risk for cardiac arrest and death.     Ottie Glazier, M.D.  Pulmonary & Critical Care Medicine

## 2018-05-01 LAB — CBC
HCT: 37.8 % — ABNORMAL LOW (ref 39.0–52.0)
Hemoglobin: 11.6 g/dL — ABNORMAL LOW (ref 13.0–17.0)
MCH: 28.4 pg (ref 26.0–34.0)
MCHC: 30.7 g/dL (ref 30.0–36.0)
MCV: 92.4 fL (ref 80.0–100.0)
Platelets: 206 10*3/uL (ref 150–400)
RBC: 4.09 MIL/uL — ABNORMAL LOW (ref 4.22–5.81)
RDW: 15.2 % (ref 11.5–15.5)
WBC: 8.1 10*3/uL (ref 4.0–10.5)
nRBC: 0 % (ref 0.0–0.2)

## 2018-05-01 LAB — GLUCOSE, CAPILLARY: Glucose-Capillary: 83 mg/dL (ref 70–99)

## 2018-05-01 LAB — BASIC METABOLIC PANEL
Anion gap: 10 (ref 5–15)
BUN: 23 mg/dL (ref 8–23)
CHLORIDE: 98 mmol/L (ref 98–111)
CO2: 31 mmol/L (ref 22–32)
Calcium: 8.7 mg/dL — ABNORMAL LOW (ref 8.9–10.3)
Creatinine, Ser: 0.76 mg/dL (ref 0.61–1.24)
GFR calc Af Amer: >60 mL/min (ref >60)
GFR calc non Af Amer: >60 mL/min (ref >60)
Glucose, Bld: 92 mg/dL (ref 70–99)
Potassium: 4.8 mmol/L (ref 3.5–5.1)
SODIUM: 139 mmol/L (ref 135–145)

## 2018-05-01 MED ORDER — TRAMADOL HCL 50 MG PO TABS
50.0000 mg | ORAL_TABLET | Freq: Four times a day (QID) | ORAL | Status: DC | PRN
  Administered 2018-05-02 – 2018-05-13 (×7): 50 mg via ORAL
  Filled 2018-05-01 (×7): qty 1

## 2018-05-01 MED ORDER — SALINE SPRAY 0.65 % NA SOLN
1.0000 | NASAL | Status: DC | PRN
  Administered 2018-05-02 (×2): 1 via NASAL
  Filled 2018-05-01: qty 44

## 2018-05-01 MED ORDER — SODIUM CHLORIDE 0.9 % IV SOLN
500.0000 mg | Freq: Every day | INTRAVENOUS | Status: AC
  Administered 2018-05-01 – 2018-05-05 (×5): 500 mg via INTRAVENOUS
  Filled 2018-05-01 (×5): qty 500

## 2018-05-01 MED ORDER — TRAMADOL HCL 50 MG PO TABS: 50.0000 mg | ORAL_TABLET | Freq: Four times a day (QID) | ORAL | Status: DC

## 2018-05-01 NOTE — Progress Notes (Signed)
CRITICAL CARE NOTE  CC  follow up respiratory failure, Influenza A  SUBJECTIVE Patient remains critically ill Prognosis is guarded     SIGNIFICANT EVENTS    BP (!) 95/47   Pulse 100   Temp 98.1 F (36.7 C) (Axillary)   Resp (!) 23   Ht 5\' 8"  (1.727 m)   Wt 64.6 kg   SpO2 94%   BMI 21.65 kg/m    REVIEW OF SYSTEMS  PATIENT IS UNABLE TO PROVIDE COMPLETE REVIEW OF SYSTEMS DUE TO SEVERE CRITICAL ILLNESS   PHYSICAL EXAMINATION:  GENERAL:critically ill appearing, +resp distress HEAD: Normocephalic, atraumatic.  EYES: Pupils equal, round, reactive to light.  No scleral icterus.  MOUTH: Moist mucosal membrane. NECK: Supple. No thyromegaly. No nodules. No JVD.  PULMONARY: +rhonchi, +wheezing CARDIOVASCULAR: S1 and S2. Regular rate and rhythm. No murmurs, rubs, or gallops.  GASTROINTESTINAL: Soft, nontender, -distended. No masses. Positive bowel sounds. No hepatosplenomegaly.  MUSCULOSKELETAL: No swelling, clubbing, or edema.  NEUROLOGIC: obtunded, GCS<12 SKIN:intact,warm,dry      Indwelling Urinary Catheter continued, requirement due to   Reason to continue Indwelling Urinary Catheter for strict Intake/Output monitoring for hemodynamic instability   Central Line continued, requirement due to   Reason to continue [employer] Monitoring of central venous pressure or other hemodynamic parameters   Ventilator continued, requirement due to, resp failure    Ventilator Sedation RASS 0 to -2        ASSESSMENT AND PLAN SYNOPSIS   Severe Hypoxic and Hypercapnic Respiratory Failure -Due to influenza A infection -On Tamiflu -continue BiPAP overnight -continue Bronchodilator Therapy -Close monitoring for secondary bacterial pneumonia   Acute COPD exacerbation Zithromax as well as Solu-Medrol 60 every 12/DuoNebs every 6 -Bibasilar atelectasis-sent spirometer and vest therapy -Pulmonary arterial hypertension and pulmonary nodules on outpatient with Dr.  Chancy Milroy pulmonary    CARDIAC ICU monitoring  ID -follow up cultures  GI/Nutrition GI PROPHYLAXIS as indicated DIET-->TF's as tolerated Constipation protocol as indicated  ENDO - ICU hypoglycemic\Hyperglycemia protocol -check FSBS per protocol   ELECTROLYTES -follow labs as needed -replace as needed -pharmacy consultation and following   DVT/GI PRX ordered TRANSFUSIONS AS NEEDED MONITOR FSBS ASSESS the need for LABS as needed   Critical Care Time devoted to patient care services described in this note is 38 minutes.     Overall, patient is critically ill, prognosis is guarded.  Patient with Multiorgan failure and at high risk for cardiac arrest and death.     Ottie Glazier, M.D.  Pulmonary & Critical Care Medicine

## 2018-05-01 NOTE — Plan of Care (Signed)
Pt cont on Bipap all night, SOB with any activity, very little reserved with movement. Oxygen level will dropped, denies any pain all night. Care plan reviewed with patient. No question and stated understanding.

## 2018-05-01 NOTE — Care Management (Signed)
RNCM checking to see if patient will qualify for home NIV/Bipap.

## 2018-05-01 NOTE — Progress Notes (Signed)
[place] at [place] NAME: Bode Pieper    MR#:  [id number]  DATE OF BIRTH:  02/24/1941  SUBJECTIVE: Patient admitted for confusion, shortness of breath found to have elevated CO2, started on BiPAP, this morning he is very alert, denies any pain.  On BiPAP.  CHIEF COMPLAINT:   Chief Complaint  Patient presents with  . Shortness of Breath    REVIEW OF SYSTEMS:   ROS CONSTITUTIONAL: No fever, fatigue or weakness.  EYES: No blurred or double vision.  EARS, NOSE, AND THROAT: No tinnitus or ear pain.  RESPIRATORY: Has some cough but no shortness of breath.  GASTROINTESTINAL: No nausea, vomiting, diarrhea or abdominal pain.  GENITOURINARY: No dysuria, hematuria.  ENDOCRINE: No polyuria, nocturia,  HEMATOLOGY: No anemia, easy bruising or bleeding SKIN: No rash or lesion. MUSCULOSKELETAL: No joint pain or arthritis.   NEUROLOGIC: No tingling, numbness, weakness.  PSYCHIATRY: No anxiety or depression.   DRUG ALLERGIES:   Allergies  Allergen Reactions  . Tamsulosin Shortness Of Breath and Other (See Comments)    dizziness  . Cefdinir Other (See Comments)    SOB  . Gold-Containing Drug Products Other (See Comments)    Proteinuria  . Hydrochlorothiazide Other (See Comments)    BP dropped  . Sulfa Antibiotics Nausea Only    VITALS:  Blood pressure 104/61, pulse 98, temperature 97.7 F (36.5 C), temperature source Oral, resp. rate (!) 26, height 5\' 8"  (1.727 m), weight 64.6 kg, SpO2 (!) 89 %.  PHYSICAL EXAMINATION:  GENERAL:  78 y.o.-year-old patient lying in the bed with no acute distress.  EYES: Pupils equal, round, reactive to light and accommodation. No scleral icterus. Extraocular muscles intact.  HEENT: Head atraumatic, normocephalic. Oropharynx and nasopharynx clear.  NECK:  Supple, no jugular venous distention. No thyroid enlargement, no tenderness.  LUNGS: diminished air entry bilaterally. CARDIOVASCULAR: S1, S2  normal. No murmurs, rubs, or gallops.  ABDOMEN: Soft, nontender, nondistended. Bowel sounds present. No organomegaly or mass.  EXTREMITIES: No pedal edema, cyanosis, or clubbing.  NEUROLOGIC: Cranial nerves II through XII are intact. Muscle strength 5/5 in all extremities. Sensation intact. Gait not checked.  PSYCHIATRIC: The patient is alert and oriented x 3.  SKIN: No obvious rash, lesion, or ulcer.    LABORATORY PANEL:   CBC Recent Labs  Lab 05/01/18 0502  WBC 8.1  HGB 11.6*  HCT 37.8*  PLT 206   ------------------------------------------------------------------------------------------------------------------  Chemistries  Recent Labs  Lab 05/01/18 0502  NA 139  K 4.8  CL 98  CO2 31  GLUCOSE 92  BUN 23  CREATININE 0.76  CALCIUM 8.7*   ------------------------------------------------------------------------------------------------------------------  Cardiac Enzymes No results for input(s): TROPONINI in the last 168 hours. ------------------------------------------------------------------------------------------------------------------  RADIOLOGY:  Dg Chest 2 View  Result Date: 05/02/2018 CLINICAL DATA:  Shortness of breath.  Productive cough. EXAM: CHEST - 2 VIEW COMPARISON:  Chest CT dated 04/02/2018 and chest x-ray dated 10/16/2015 FINDINGS: Heart size is within normal limits considering the AP technique. Aortic atherosclerosis. Severe emphysema. There is chronic marked accentuation of the interstitial markings in the left midzone and at low both lung bases, slightly progressed since 2017 but essentially unchanged since the recent CT scan. No acute bone abnormality. Chronic blunting of the posterior costophrenic angles. IMPRESSION: Severe chronic emphysema and interstitial lung disease, essentially unchanged since the CT scan of 04/02/2018. Aortic Atherosclerosis (ICD10-I70.0) and Emphysema (ICD10-J43.9). Electronically Signed   By: Lorriane Shire M.D.   On: 04/28/2018  12:03  EKG:   Orders placed or performed during the hospital encounter of 05/25/2018  . ED EKG  . ED EKG  . EKG 12-Lead  . EKG 12-Lead    ASSESSMENT AND PLAN:   78 year old male patient with history of COPD, lung cancer follows Dr. Daun Peacock as an outpatient per pulmonary comes in because of shortness of breath and also confusion found to have hypercapnia.  #1 severe hypoxic and hypercapnic respiratory failure secondary to influenza A infection, continue BiPAP, continue Tamiflu, bronchodilators, on doxycycline for prophylaxis, patient is followed by intensivist. 2.  Hypercapnic respiratory failure: Improving, continue BiPAP, check ABG 3.  Metabolic encephalopathy secondary to hypercapnia, patient is very alert and awake today morning.,  Start on clear liquids  If okay with intensivist. 4.  COPD exacerbation, continue doxycycline, Solu-Medrol, duo nebs.  All the records are reviewed and case discussed with Care Management/Social Workerr. Management plans discussed with the patient, family and they are in agreement.  CODE STATUS: Full code  TOTAL TIME TAKING CARE OF THIS PATIENT: 38 minutes.  Possible discharge in the next 1 to 2 days pending clinical improvement.Epifanio Lesches M.D on 05/01/2018 at 9:03 AM  Between 7am to 6pm - Pager - [phone]  After 6pm go to [email or web address] - password [email or web address] [facility]  [facility] [facility]  Office  [phone]  CC: Primary care physician; Leone Haven, MD   Note: This dictation was prepared with Dragon dictation along with smaller phrase technology. Any transcriptional errors that result from this process are unintentional.

## 2018-05-02 MED ORDER — LATANOPROST 0.005 % OP SOLN
1.0000 [drp] | Freq: Two times a day (BID) | OPHTHALMIC | Status: DC
  Administered 2018-05-02 – 2018-05-14 (×20): 1 [drp] via OPHTHALMIC
  Filled 2018-05-02: qty 2.5

## 2018-05-02 MED ORDER — METHYLPREDNISOLONE SODIUM SUCC 125 MG IJ SOLR
60.0000 mg | INTRAMUSCULAR | Status: DC
  Administered 2018-05-03: 60 mg via INTRAVENOUS
  Filled 2018-05-02: qty 2

## 2018-05-02 MED ORDER — LATANOPROST 0.005 % OP SOLN: 1.0000 [drp] | Freq: Every day | OPHTHALMIC | Status: DC

## 2018-05-02 MED ORDER — ENOXAPARIN SODIUM 40 MG/0.4ML ~~LOC~~ SOLN
40.0000 mg | SUBCUTANEOUS | Status: DC
  Administered 2018-05-02 – 2018-05-13 (×11): 40 mg via SUBCUTANEOUS
  Filled 2018-05-02 (×10): qty 0.4

## 2018-05-02 NOTE — Progress Notes (Signed)
CRITICAL CARE NOTE  CC  follow up respiratory failure, Acute COPD exacerbation , influenza A infection   SUBJECTIVE  Wheezing imroved, transitioned to HhFNC40L/m doing well, NIV ovenight     SIGNIFICANT EVENTS -improved lung sounds , HhFNC   BP (!) 142/64 (BP Location: Right Arm)   Pulse 87   Temp 98 F (36.7 C) (Axillary)   Resp (!) 21   Ht 5\' 8"  (1.727 m)   Wt 62.2 kg   SpO2 96%   BMI 20.85 kg/m    REVIEW OF SYSTEMS  Review of Systems  Constitutional: Negative for fever and weight loss.  HENT: Negative for congestion and hearing loss.   Eyes: Positive for pain.  Respiratory: Positive for cough, shortness of breath and wheezing.   Cardiovascular: Negative for chest pain.  Gastrointestinal: Negative for vomiting.  Genitourinary: Negative for hematuria.  Musculoskeletal: Positive for back pain.  Skin: Negative for rash.  Neurological: Positive for weakness. Negative for headaches.  Endo/Heme/Allergies: Positive for environmental allergies.  Psychiatric/Behavioral: Negative for substance abuse.    PHYSICAL EXAMINATION:  GENERAL:NAD, + mild resp distress HEAD: Normocephalic, atraumatic.  EYES: Pupils equal, round, reactive to light.  No scleral icterus.  MOUTH: Moist mucosal membrane. NECK: Supple. No thyromegaly. No nodules. No JVD.  PULMONARY:  Mild wheezing bilaterally  CARDIOVASCULAR: S1 and S2. Regular rate and rhythm. No murmurs, rubs, or gallops.  GASTROINTESTINAL: Soft, nontender, -distended. No masses. Positive bowel sounds. No hepatosplenomegaly.  MUSCULOSKELETAL: No swelling, clubbing, or edema.  NEUROLOGIC: obtunded, GCS 14 SKIN:intact,warm,dry      Indwelling Urinary Catheter continued, requirement due to   Reason to continue Indwelling Urinary Catheter for strict Intake/Output monitoring for hemodynamic instability   Central Line continued, requirement due to   Reason to continue [employer] Monitoring of central venous pressure or  other hemodynamic parameters   Ventilator continued, requirement due to, resp failure    Ventilator Sedation RASS 0 to -2      ASSESSMENT AND PLAN SYNOPSIS   Severe Hypoxic and Hypercapnic Respiratory Failure -Due to influenza A infection -On Tamiflu -continueBiPAP overnight, HHFNC40l/m -continue Bronchodilator Therapy -Close monitoring for secondary bacterial pneumonia   Acute COPD exacerbation Zithromax 500 daily as well as Solu-Medrol 60 every 12/DuoNebs every 6 -Bibasilar atelectasis-sent spirometerand vest therapy -Pulmonary arterial hypertension and pulmonary nodules on outpatient with Dr. Chancy Milroy pulmonary    CARDIAC ICU monitoring  ID CBC and BMP reviewed, within reference range no signs of sepsis  GI/Nutrition GI PROPHYLAXIS as indicated DIET-->TF's as tolerated Constipation protocol as indicated  ENDO - ICU hypoglycemic\Hyperglycemia protocol -check FSBS per protocol   ELECTROLYTES -follow labs as needed -replace as needed -pharmacy consultation and following   DVT/GI PRX ordered TRANSFUSIONS AS NEEDED MONITOR FSBS ASSESS the need for LABS as needed   Critical Care Time devoted to patient care services described in this note is27minutes.     Overall, patient is critically ill, prognosis is guarded. Patient with Multiorgan failure and at high risk for cardiac arrest and death.     Ottie Glazier, M.D.  Pulmonary & Critical Care Medicine

## 2018-05-02 NOTE — Progress Notes (Signed)
[facility] at [place] NAME: Peter Johnston    MR#:  [id number]  DATE OF BIRTH:  Jun 17, 1940  Alert, awake, oriented, high flow nasal cannula at 40 L/min, states that he is having shortness of breath.  CHIEF COMPLAINT:   Chief Complaint  Patient presents with  . Shortness of Breath    REVIEW OF SYSTEMS:   Review of Systems  Constitutional: Negative for chills and fever.  HENT: Negative for hearing loss.   Eyes: Negative for blurred vision, double vision and photophobia.  Respiratory: Positive for cough and shortness of breath. Negative for hemoptysis.   Cardiovascular: Negative for palpitations, orthopnea and leg swelling.  Gastrointestinal: Negative for abdominal pain, diarrhea and vomiting.  Genitourinary: Negative for dysuria and urgency.  Musculoskeletal: Negative for myalgias and neck pain.  Skin: Negative for rash.  Neurological: Negative for dizziness, focal weakness, seizures, weakness and headaches.  Psychiatric/Behavioral: Negative for memory loss. The patient does not have insomnia.      DRUG ALLERGIES:   Allergies  Allergen Reactions  . Tamsulosin Shortness Of Breath and Other (See Comments)    dizziness  . Cefdinir Other (See Comments)    SOB  . Gold-Containing Drug Products Other (See Comments)    Proteinuria  . Hydrochlorothiazide Other (See Comments)    BP dropped  . Sulfa Antibiotics Nausea Only    VITALS:  Blood pressure (!) 142/64, pulse 87, temperature 98.8 F (37.1 C), temperature source Axillary, resp. rate (!) 21, height 5\' 8"  (1.727 m), weight 62.2 kg, SpO2 96 %.  PHYSICAL EXAMINATION:  GENERAL:  78 y.o.-year-old patient lying in the bed with no acute distress.  EYES: Pupils equal, round, reactive to light and accommodation. No scleral icterus. Extraocular muscles intact.  HEENT: Head atraumatic, normocephalic. Oropharynx and nasopharynx clear.  NECK:  Supple, no jugular venous distention. No  thyroid enlargement, no tenderness.  LUNGS: diminished air entry bilaterally. CARDIOVASCULAR: S1, S2 normal. No murmurs, rubs, or gallops.  ABDOMEN: Soft, nontender, nondistended. Bowel sounds present. No organomegaly or mass.  EXTREMITIES: No pedal edema, cyanosis, or clubbing.  NEUROLOGIC: Cranial nerves II through XII are intact. Muscle strength 5/5 in all extremities. Sensation intact. Gait not checked.  PSYCHIATRIC: The patient is alert and oriented x 3.  SKIN: No obvious rash, lesion, or ulcer.    LABORATORY PANEL:   CBC Recent Labs  Lab 05/01/18 0502  WBC 8.1  HGB 11.6*  HCT 37.8*  PLT 206   ------------------------------------------------------------------------------------------------------------------  Chemistries  Recent Labs  Lab 05/01/18 0502  NA 139  K 4.8  CL 98  CO2 31  GLUCOSE 92  BUN 23  CREATININE 0.76  CALCIUM 8.7*   ------------------------------------------------------------------------------------------------------------------  Cardiac Enzymes No results for input(s): TROPONINI in the last 168 hours. ------------------------------------------------------------------------------------------------------------------  RADIOLOGY:  No results found.  EKG:   Orders placed or performed during the hospital encounter of 05/21/2018  . ED EKG  . ED EKG  . EKG 12-Lead  . EKG 12-Lead    ASSESSMENT AND PLAN:   78 year old male patient with history of COPD, lung cancer follows Dr. Daun Peacock as an outpatient per pulmonary comes in because of shortness of breath and also confusion found to have hypercapnia.  #1 severe hypoxic and hypercapnic respiratory failure secondary to influenza A infection, continue BiPAP, continue Tamiflu, bronchodilators, on doxycycline for prophylaxis, patient is followed by intensivist.,  Continue high flow nasal cannula today, monitor in ICU for impending respiratory status.  Patient is off  the BiPAP but still needing high  flow nasal cannula. 2.  Hypercapnic respiratory failure: Improving, c continue high flow nasal cannula,  3.  Metabolic encephalopathy secondary to hypercapnia, improved 4.  COPD exacerbation, continue doxycycline, Solu-Medrol, duo nebs.  All the records are reviewed and case discussed with Care Management/Social Workerr. Management plans discussed with the patient, family and they are in agreement.  CODE STATUS: Full code  TOTAL TIME TAKING CARE OF THIS PATIENT: 38 minutes.  Possible discharge in the next 1 to 2 days pending clinical improvement.Epifanio Lesches M.D on 05/02/2018 at 2:30 PM  Between 7am to 6pm - Pager - [phone]  After 6pm go to [email or web address] - password [email or web address] [facility]  [place] [facility]  Office  [phone]  CC: Primary care physician; Leone Haven, MD   Note: This dictation was prepared with Dragon dictation along with smaller phrase technology. Any transcriptional errors that result from this process are unintentional.

## 2018-05-03 LAB — CBC
HCT: 37.9 % — ABNORMAL LOW (ref 39.0–52.0)
Hemoglobin: 11.6 g/dL — ABNORMAL LOW (ref 13.0–17.0)
MCH: 27.8 pg (ref 26.0–34.0)
MCHC: 30.6 g/dL (ref 30.0–36.0)
MCV: 90.7 fL (ref 80.0–100.0)
Platelets: 156 10*3/uL (ref 150–400)
RBC: 4.18 MIL/uL — ABNORMAL LOW (ref 4.22–5.81)
RDW: 15.2 % (ref 11.5–15.5)
WBC: 8.7 10*3/uL (ref 4.0–10.5)
nRBC: 0 % (ref 0.0–0.2)

## 2018-05-03 LAB — BASIC METABOLIC PANEL
Anion gap: 5 (ref 5–15)
BUN: 29 mg/dL — ABNORMAL HIGH (ref 8–23)
CALCIUM: 8.7 mg/dL — AB (ref 8.9–10.3)
CO2: 36 mmol/L — ABNORMAL HIGH (ref 22–32)
Chloride: 98 mmol/L (ref 98–111)
Creatinine, Ser: 0.62 mg/dL (ref 0.61–1.24)
GFR calc Af Amer: >60 mL/min (ref >60)
GFR calc non Af Amer: >60 mL/min (ref >60)
Glucose, Bld: 128 mg/dL — ABNORMAL HIGH (ref 70–99)
Potassium: 4.2 mmol/L (ref 3.5–5.1)
SODIUM: 139 mmol/L (ref 135–145)

## 2018-05-03 MED ORDER — PREDNISOLONE 5 MG PO TABS: 50.0000 mg | ORAL_TABLET | Freq: Every day | ORAL | Status: DC

## 2018-05-03 MED ORDER — PREDNISONE 20 MG PO TABS
50.0000 mg | ORAL_TABLET | Freq: Every day | ORAL | Status: DC
  Administered 2018-05-05 – 2018-05-09 (×5): 50 mg via ORAL
  Filled 2018-05-03 (×2): qty 5
  Filled 2018-05-03 (×3): qty 1

## 2018-05-03 NOTE — Progress Notes (Signed)
[place] at [place] NAME: Peter Johnston    MR#:  [id number]  DATE OF BIRTH:  1941-03-20  Says he is feeling better, transition from high flow to nasal cannula.  CHIEF COMPLAINT:   Chief Complaint  Patient presents with  . Shortness of Breath    REVIEW OF SYSTEMS:   Review of Systems  Constitutional: Negative for chills and fever.  HENT: Negative for hearing loss.   Eyes: Negative for blurred vision, double vision and photophobia.  Respiratory: Positive for cough and shortness of breath. Negative for hemoptysis.   Cardiovascular: Negative for palpitations, orthopnea and leg swelling.  Gastrointestinal: Negative for abdominal pain, diarrhea and vomiting.  Genitourinary: Negative for dysuria and urgency.  Musculoskeletal: Negative for myalgias and neck pain.  Skin: Negative for rash.  Neurological: Negative for dizziness, focal weakness, seizures, weakness and headaches.  Psychiatric/Behavioral: Negative for memory loss. The patient does not have insomnia.      DRUG ALLERGIES:   Allergies  Allergen Reactions  . Tamsulosin Shortness Of Breath and Other (See Comments)    dizziness  . Cefdinir Other (See Comments)    SOB  . Gold-Containing Drug Products Other (See Comments)    Proteinuria  . Hydrochlorothiazide Other (See Comments)    BP dropped  . Sulfa Antibiotics Nausea Only    VITALS:  Blood pressure (!) 106/59, pulse 95, temperature 98.8 F (37.1 C), temperature source Oral, resp. rate (!) 24, height 5\' 8"  (1.727 m), weight 61.4 kg, SpO2 91 %.  PHYSICAL EXAMINATION:  GENERAL:  78 y.o.-year-old patient lying in the bed with no acute distress.  EYES: Pupils equal, round, reactive to light and accommodation. No scleral icterus. Extraocular muscles intact.  HEENT: Head atraumatic, normocephalic. Oropharynx and nasopharynx clear.  NECK:  Supple, no jugular venous distention. No thyroid enlargement, no tenderness.   LUNGS: diminished air entry bilaterally. CARDIOVASCULAR: S1, S2 normal. No murmurs, rubs, or gallops.  ABDOMEN: Soft, nontender, nondistended. Bowel sounds present. No organomegaly or mass.  EXTREMITIES: No pedal edema, cyanosis, or clubbing.  NEUROLOGIC: Cranial nerves II through XII are intact. Muscle strength 5/5 in all extremities. Sensation intact. Gait not checked.  PSYCHIATRIC: The patient is alert and oriented x 3.  SKIN: No obvious rash, lesion, or ulcer.    LABORATORY PANEL:   CBC Recent Labs  Lab 05/03/18 0540  WBC 8.7  HGB 11.6*  HCT 37.9*  PLT 156   ------------------------------------------------------------------------------------------------------------------  Chemistries  Recent Labs  Lab 05/03/18 0540  NA 139  K 4.2  CL 98  CO2 36*  GLUCOSE 128*  BUN 29*  CREATININE 0.62  CALCIUM 8.7*   ------------------------------------------------------------------------------------------------------------------  Cardiac Enzymes No results for input(s): TROPONINI in the last 168 hours. ------------------------------------------------------------------------------------------------------------------  RADIOLOGY:  No results found.  EKG:   Orders placed or performed during the hospital encounter of 05/22/2018  . ED EKG  . ED EKG  . EKG 12-Lead  . EKG 12-Lead    ASSESSMENT AND PLAN:   78 year old male patient with history of COPD, lung cancer follows Dr. Daun Peacock as an outpatient per pulmonary comes in because of shortness of breath and also confusion found to have hypercapnia.  #1 severe hypoxic and hypercapnic respiratory failure secondary to influenza A infection, continue BiPAP, continue Tamiflu, bronchodilators, on doxycycline for prophylaxis, patient is followed by intensivist.,  Continue high flow nasal cannula today, wean off high flow.  As tolerated.  Spoke with intensivist. 2.  Hypercapnic respiratory failure: Improving,  3.  Metabolic  encephalopathy secondary to hypercapnia, improved 4.  COPD exacerbation, continue doxycycline, Solu-Medrol, duo nebs. Critical, prognosis guarded, patient has underlying severe COPD and follows up with pulmonary. All the records are reviewed and case discussed with Care Management/Social Workerr. Management plans discussed with the patient, family and they are in agreement.  CODE STATUS: Full code  TOTAL TIME TAKING CARE OF THIS PATIENT: 38 minutes.  Possible discharge in the next 1 to 2 days pending clinical improvement.Epifanio Lesches M.D on 05/03/2018 at 4:08 PM  Between 7am to 6pm - Pager - [phone]  After 6pm go to [email or web address] - password [email or web address] [facility]  [place] [facility]  Office  [phone]  CC: Primary care physician; Leone Haven, MD   Note: This dictation was prepared with Dragon dictation along with smaller phrase technology. Any transcriptional errors that result from this process are unintentional.

## 2018-05-03 NOTE — Progress Notes (Signed)
CRITICAL CARE NOTE  CC  follow up respiratory failure  SUBJECTIVE Patient remains critically ill Prognosis is guarded Resting in bed comfortably, no new complaints.  Overnight events noted.    SIGNIFICANT EVENTS    Vitals:   05/03/18 1400 05/03/18 1500  BP: (!) 106/56 (!) 106/59  Pulse: 99 95  Resp: 19 (!) 24  Temp:    SpO2: (!) 89% 91%     REVIEW OF SYSTEMS  PATIENT IS UNABLE TO PROVIDE COMPLETE REVIEW OF SYSTEMS DUE TO acute critical ILLNESS   PHYSICAL EXAMINATION:  GENERAL:critically ill appearing, +resp distress HEAD: Normocephalic, atraumatic.  EYES: Pupils equal, round, reactive to light.  No scleral icterus.  MOUTH: Moist mucosal membrane. NECK: Supple. No thyromegaly. No nodules. No JVD.  PULMONARY: +rhonchi at bases CARDIOVASCULAR: S1 and S2. Regular rate and rhythm. No murmurs, rubs, or gallops.  GASTROINTESTINAL: Soft, nontender, -distended. No masses. Positive bowel sounds. No hepatosplenomegaly.  MUSCULOSKELETAL: No swelling, clubbing, or edema.  NEUROLOGIC: Mild distress due to acute illness SKIN:intact,warm,dry   Labs and Imaging:     -I personally reviewed most recent blood work, imaging and microbiology - significant findings today are resolution of leukocytosis  LAB RESULTS: Recent Labs  Lab 05/22/2018 1131 05/01/18 0502 05/03/18 0540  NA 139 139 139  K 4.2 4.8 4.2  CL 96* 98 98  CO2 38* 31 36*  BUN 19 23 29*  CREATININE 0.84 0.76 0.62  GLUCOSE 115* 92 128*   Recent Labs  Lab 05/09/2018 1131 05/01/18 0502 05/03/18 0540  HGB 13.3 11.6* 11.6*  HCT 43.4 37.8* 37.9*  WBC 13.1* 8.1 8.7  PLT 236 206 156     IMAGING RESULTS: No results found.   ASSESSMENT AND PLAN SYNOPSIS  -Multidisciplinary rounds held today   Severe Hypoxic and Hypercapnic Respiratory Failure -Due to influenza A infection -On Tamiflu -continueBiPAP overnight, HHFNC -continue Bronchodilator Therapy -Close monitoring for secondary bacterial  pneumonia   Acute COPD exacerbation Zithromax500 daily as well as Solu-Medrol 60 every 12/DuoNebs every 6-transition to p.o. Pred -Bibasilar atelectasis-sent spirometerand vest therapy -Pulmonary arterial hypertension and pulmonary nodules on outpatient with Dr. Chancy Milroy pulmonary    CARDIAC ICU monitoring  ID CBC and BMP reviewed, within reference range no signs of sepsis  GI/Nutrition GI PROPHYLAXIS as indicated DIET-->TF's as tolerated Constipation protocol as indicated  ENDO - ICU hypoglycemic\Hyperglycemia protocol -check FSBS per protocol   ELECTROLYTES -follow labs as needed -replace as needed -pharmacy consultation and following   DVT/GI PRX ordered TRANSFUSIONS AS NEEDED MONITOR FSBS ASSESS the need for LABS as needed  Critical care provider statement:    Critical care time (minutes):  32   Critical care time was exclusive of:  Separately billable procedures and  treating other patients   Critical care was necessary to treat or prevent imminent or  life-threatening deterioration of the following conditions:   Acute hypoxic respiratory failure, acute exacerbation of COPD secondary to influenza A   Critical care was time spent personally by me on the following  activities:  Development of treatment plan with patient or surrogate,  discussions with consultants, evaluation of patient's response to  treatment, examination of patient, obtaining history from patient or  surrogate, ordering and performing treatments and interventions, ordering  and review of laboratory studies and re-evaluation of patient's condition   I assumed direction of critical care for this patient from another  provider in my specialty: no     Ottie Glazier, M.D.  Pulmonary & Critical Care Medicine  [facility]

## 2018-05-04 MED ORDER — AMIODARONE LOAD VIA INFUSION: 150.0000 mg | Freq: Once | INTRAVENOUS | Status: DC

## 2018-05-04 MED ORDER — AMIODARONE IV BOLUS ONLY 150 MG/100ML
150.0000 mg | Freq: Once | INTRAVENOUS | Status: AC
  Administered 2018-05-04: 150 mg via INTRAVENOUS
  Filled 2018-05-04: qty 100

## 2018-05-04 MED ORDER — IPRATROPIUM-ALBUTEROL 0.5-2.5 (3) MG/3ML IN SOLN
RESPIRATORY_TRACT | Status: AC
  Filled 2018-05-04: qty 3

## 2018-05-04 NOTE — Care Management (Signed)
Severe COPD and end stage emphysema; chronic respiratory failure with hypoxemia.  Patient continues to exhibit signs of hypercapnia associated with chronic respiratory failure secondary to severe COPD.  Patient requires the use of NIV both QHS and daytime to help with exacerbation periods.  The use of NIV will treat the patient's high PCO2 levels and can reduce risk of exacerbations and future hospitalizations when used at night and during the day.  Patient will need these advanced settings in conjunction with his current medication regimen; BIPAP with backup rate has been tried and failed.  Failure to have NIV available for use over a 24 hr period could lead to death.

## 2018-05-04 NOTE — Progress Notes (Signed)
[place] at [place] NAME: Peter Johnston    MR#:  [id number]  DATE OF BIRTH:  06/02/1940  Patient is on high flow nasal cannula.  CHIEF COMPLAINT:   Chief Complaint  Patient presents with  . Shortness of Breath    REVIEW OF SYSTEMS:   Review of Systems  Constitutional: Negative for chills and fever.  HENT: Negative for hearing loss.   Eyes: Negative for blurred vision, double vision and photophobia.  Respiratory: Positive for cough and shortness of breath. Negative for hemoptysis.   Cardiovascular: Negative for palpitations, orthopnea and leg swelling.  Gastrointestinal: Negative for abdominal pain, diarrhea and vomiting.  Genitourinary: Negative for dysuria and urgency.  Musculoskeletal: Negative for myalgias and neck pain.  Skin: Negative for rash.  Neurological: Negative for dizziness, focal weakness, seizures, weakness and headaches.  Psychiatric/Behavioral: Negative for memory loss. The patient does not have insomnia.      DRUG ALLERGIES:   Allergies  Allergen Reactions  . Tamsulosin Shortness Of Breath and Other (See Comments)    dizziness  . Cefdinir Other (See Comments)    SOB  . Gold-Containing Drug Products Other (See Comments)    Proteinuria  . Hydrochlorothiazide Other (See Comments)    BP dropped  . Sulfa Antibiotics Nausea Only    VITALS:  Blood pressure 127/74, pulse (!) 113, temperature 97.8 F (36.6 C), temperature source Axillary, resp. rate (!) 26, height 5\' 8"  (1.727 m), weight 61.4 kg, SpO2 94 %.  PHYSICAL EXAMINATION:  GENERAL:  78 y.o.-year-old patient lying in the bed with no acute distress.  EYES: Pupils equal, round, reactive to light and accommodation. No scleral icterus. Extraocular muscles intact.  HEENT: Head atraumatic, normocephalic. Oropharynx and nasopharynx clear.  NECK:  Supple, no jugular venous distention. No thyroid enlargement, no tenderness.  LUNGS: diminished air entry  bilaterally. CARDIOVASCULAR: S1, S2 normal. No murmurs, rubs, or gallops.  ABDOMEN: Soft, nontender, nondistended. Bowel sounds present. No organomegaly or mass.  EXTREMITIES: No pedal edema, cyanosis, or clubbing.  NEUROLOGIC: Cranial nerves II through XII are intact. Muscle strength 5/5 in all extremities. Sensation intact. Gait not checked.  PSYCHIATRIC: The patient is alert and oriented x 3.  SKIN: No obvious rash, lesion, or ulcer.    LABORATORY PANEL:   CBC Recent Labs  Lab 05/03/18 0540  WBC 8.7  HGB 11.6*  HCT 37.9*  PLT 156   ------------------------------------------------------------------------------------------------------------------  Chemistries  Recent Labs  Lab 05/03/18 0540  NA 139  K 4.2  CL 98  CO2 36*  GLUCOSE 128*  BUN 29*  CREATININE 0.62  CALCIUM 8.7*   ------------------------------------------------------------------------------------------------------------------  Cardiac Enzymes No results for input(s): TROPONINI in the last 168 hours. ------------------------------------------------------------------------------------------------------------------  RADIOLOGY:  No results found.  EKG:   Orders placed or performed during the hospital encounter of 05/11/2018  . ED EKG  . ED EKG  . EKG 12-Lead  . EKG 12-Lead    ASSESSMENT AND PLAN:   78 year old male patient with history of COPD, lung cancer follows Dr. Daun Peacock as an outpatient per pulmonary comes in because of shortness of breath and also confusion found to have hypercapnia.  #1 severe hypoxic and hypercapnic respiratory failure secondary to influenza A infection,  continue high flow nasal cannula, continue Tamiflu, continue azithromycin for possible bronchitis.  Continue bronchodilators. Patient needs ABG on nasal cannula to qualify for BiPAP at home as per case manager. 2.  Hypercapnic respiratory failure: Improving,, off the BiPAP but on  high flow nasal cannula. 3.   Metabolic encephalopathy secondary to hypercapnia, improved 4.  COPD exacerbation, continue doxycycline, Solu-Medrol, duo nebs. Critical, prognosis guarded, patient has underlying severe COPD and follows up with pulmonary. Check ABG on cannula for BiPAP Appreciate pulmonary following. All the records are reviewed and case discussed with Care Management/Social Workerr. Management plans discussed with the patient, family and they are in agreement.  CODE STATUS: Full code  TOTAL TIME TAKING CARE OF THIS PATIENT: 38 minutes.  Possible discharge in the next 1 to 2 days pending clinical improvement.Epifanio Lesches M.D on 05/04/2018 at 12:50 PM  Between 7am to 6pm - Pager - [phone]  After 6pm go to [email or web address] - password [email or web address] [facility]  [place] [facility]  Office  [phone]  CC: Primary care physician; Leone Haven, MD   Note: This dictation was prepared with Dragon dictation along with smaller phrase technology. Any transcriptional errors that result from this process are unintentional.

## 2018-05-04 NOTE — Progress Notes (Signed)
CRITICAL CARE NOTE  CC  follow up respiratory failure.  SUBJECTIVE  Resting in bed comfortably, no new complaints.  Overnight events noted.    SIGNIFICANT EVENTS    Vitals:   05/04/18 0900 05/04/18 1000  BP: (!) 111/59 127/74  Pulse: (!) 102 (!) 113  Resp: (!) 28 (!) 26  Temp:    SpO2: (!) 86% 94%     REVIEW OF SYSTEMS  10 sys ROS done and is neg except as per subjective findings  PHYSICAL EXAMINATION:  GENERAL:critically ill appearing, +resp distress HEAD: Normocephalic, atraumatic.  EYES: Pupils equal, round, reactive to light.  No scleral icterus.  MOUTH: Moist mucosal membrane. NECK: Supple. No thyromegaly. No nodules. No JVD.  PULMONARY: +rhonchi at bases CARDIOVASCULAR: S1 and S2. Regular rate and rhythm. No murmurs, rubs, or gallops.  GASTROINTESTINAL: Soft, nontender, -distended. No masses. Positive bowel sounds. No hepatosplenomegaly.  MUSCULOSKELETAL: No swelling, clubbing, or edema.  NEUROLOGIC: Mild distress due to acute illness SKIN:intact,warm,dry   Labs and Imaging:     -I personally reviewed most recent blood work, imaging and microbiology - significant findings today are metabolic alkalosis, anemia.    LAB RESULTS: Recent Labs  Lab 05/10/2018 1131 05/01/18 0502 05/03/18 0540  NA 139 139 139  K 4.2 4.8 4.2  CL 96* 98 98  CO2 38* 31 36*  BUN 19 23 29*  CREATININE 0.84 0.76 0.62  GLUCOSE 115* 92 128*   Recent Labs  Lab 04/29/2018 1131 05/01/18 0502 05/03/18 0540  HGB 13.3 11.6* 11.6*  HCT 43.4 37.8* 37.9*  WBC 13.1* 8.1 8.7  PLT 236 206 156     IMAGING RESULTS: No results found.   Severe Hypoxic and Hypercapnic Respiratory Failure -Due to influenza A infection -On Tamiflu -continueBiPAP overnight, HHFNC -continue Bronchodilator Therapy -Optimized for DC from MICU   Acute COPD exacerbation Zithromax500 dailyas well as Solu-Medrol 60 every 12/DuoNebs every 6-transition to p.o. Pred -Bibasilar atelectasis-sent  spirometerand vest therapy -Pulmonary arterial hypertension and pulmonary nodules on outpatient with Dr. Chancy Milroy pulmonary    CARDIAC ICU monitoring  ID CBC and BMP reviewed, within reference range no signs of sepsis  GI/Nutrition GI PROPHYLAXIS as indicated DIET-->TF's as tolerated Constipation protocol as indicated  ENDO - ICU hypoglycemic\Hyperglycemia protocol -check FSBS per protocol   ELECTROLYTES -follow labs as needed -replace as needed -pharmacy consultation and following   DVT/GI PRX ordered TRANSFUSIONS AS NEEDED MONITOR FSBS ASSESS the need for LABS as needed    Ottie Glazier, M.D.  Pulmonary & [place]

## 2018-05-05 LAB — COMPREHENSIVE METABOLIC PANEL
ALT: 17 U/L (ref 0–44)
ANION GAP: 3 — AB (ref 5–15)
AST: 25 U/L (ref 15–41)
Albumin: 2.7 g/dL — ABNORMAL LOW (ref 3.5–5.0)
Alkaline Phosphatase: 67 U/L (ref 38–126)
BUN: 16 mg/dL (ref 8–23)
CO2: 43 mmol/L — ABNORMAL HIGH (ref 22–32)
Calcium: 8.8 mg/dL — ABNORMAL LOW (ref 8.9–10.3)
Chloride: 95 mmol/L — ABNORMAL LOW (ref 98–111)
Creatinine, Ser: 0.5 mg/dL — ABNORMAL LOW (ref 0.61–1.24)
GFR calc Af Amer: >60 mL/min (ref >60)
GFR calc non Af Amer: >60 mL/min (ref >60)
GLUCOSE: 97 mg/dL (ref 70–99)
Potassium: 4.1 mmol/L (ref 3.5–5.1)
Sodium: 141 mmol/L (ref 135–145)
TOTAL PROTEIN: 5.5 g/dL — AB (ref 6.5–8.1)
Total Bilirubin: 0.6 mg/dL (ref 0.3–1.2)

## 2018-05-05 LAB — CBC
HCT: 36.5 % — ABNORMAL LOW (ref 39.0–52.0)
Hemoglobin: 11.1 g/dL — ABNORMAL LOW (ref 13.0–17.0)
MCH: 27.7 pg (ref 26.0–34.0)
MCHC: 30.4 g/dL (ref 30.0–36.0)
MCV: 91 fL (ref 80.0–100.0)
NRBC: 0 % (ref 0.0–0.2)
Platelets: 142 10*3/uL — ABNORMAL LOW (ref 150–400)
RBC: 4.01 MIL/uL — ABNORMAL LOW (ref 4.22–5.81)
RDW: 14.6 % (ref 11.5–15.5)
WBC: 8.1 10*3/uL (ref 4.0–10.5)

## 2018-05-05 LAB — PHOSPHORUS: Phosphorus: 1.5 mg/dL — ABNORMAL LOW (ref 2.5–4.6)

## 2018-05-05 LAB — MAGNESIUM: Magnesium: 1.6 mg/dL — ABNORMAL LOW (ref 1.7–2.4)

## 2018-05-05 MED ORDER — K PHOS MONO-SOD PHOS DI & MONO 155-852-130 MG PO TABS
500.0000 mg | ORAL_TABLET | ORAL | Status: AC
  Administered 2018-05-05 (×4): 500 mg via ORAL
  Filled 2018-05-05 (×4): qty 2

## 2018-05-05 MED ORDER — MAGNESIUM SULFATE 2 GM/50ML IV SOLN
2.0000 g | Freq: Once | INTRAVENOUS | Status: AC
  Administered 2018-05-05: 2 g via INTRAVENOUS
  Filled 2018-05-05: qty 50

## 2018-05-05 NOTE — Progress Notes (Signed)
Dr. Lanney Gins notified that pt's HR has been going into the 150s but then going right back down into the 110s-120s. Verbal order received to D/C breathing treatments.

## 2018-05-05 NOTE — Progress Notes (Signed)
CRITICAL CARE NOTE  CC  follow up respiratory failure  SUBJECTIVE Prognosis is guarded Resting in bed comfortably, no new complaints.  Overnight events noted.    SIGNIFICANT EVENTS Set up for home BiPAP , discussed with case management   Vitals:   05/05/18 0731 05/05/18 0800  BP:  139/90  Pulse:  (!) 110  Resp:  (!) 30  Temp: 97.9 F (36.6 C)   SpO2:  93%     REVIEW OF SYSTEMS  10 system ROS is negative except as per subjective findings  PHYSICAL EXAMINATION:  GENERAL:critically ill appearing, +resp distress HEAD: Normocephalic, atraumatic.  EYES: Pupils equal, round, reactive to light.  No scleral icterus.  MOUTH: Moist mucosal membrane. NECK: Supple. No thyromegaly. No nodules. No JVD.  PULMONARY: +rhonchi at bases CARDIOVASCULAR: S1 and S2. Regular rate and rhythm. No murmurs, rubs, or gallops.  GASTROINTESTINAL: Soft, nontender, non-distended. No masses. Positive bowel sounds. No hepatosplenomegaly.  MUSCULOSKELETAL: No swelling, clubbing, or edema.  NEUROLOGIC: Mild distress due to acute illness SKIN:intact,warm,dry   Labs and Imaging:     -I personally reviewed most recent blood work, imaging and microbiology - significant findings today are hypophosphatemia, hypomagnesemia, arterial blood gas with hypoxia and hypercapnia  LAB RESULTS: Recent Labs  Lab 05/01/18 0502 05/03/18 0540 05/05/18 0445  NA 139 139 141  K 4.8 4.2 4.1  CL 98 98 95*  CO2 31 36* 43*  BUN 23 29* 16  CREATININE 0.76 0.62 0.50*  GLUCOSE 92 128* 97   Recent Labs  Lab 05/01/18 0502 05/03/18 0540 05/05/18 0445  HGB 11.6* 11.6* 11.1*  HCT 37.8* 37.9* 36.5*  WBC 8.1 8.7 8.1  PLT 206 156 142*     IMAGING RESULTS: No results found.   ASSESSMENT AND PLAN SYNOPSIS  Severe Hypoxic and Hypercapnic Respiratory Failure -Due to influenza A infection -On Tamiflu -continueBiPAP overnight, HHFNC -continue Bronchodilator Therapy -Optimized for DC from MICU   Acute COPD  exacerbation Zithromax500 dailyas well as  p.o. Pred -Bibasilar atelectasis-sent spirometerand vest therapy -Pulmonary arterial hypertension and pulmonary nodules on outpatient with Dr. Chancy Milroy pulmonary  Hypomagnesemia and hypo-phosphatemia -Pharmacology consultation  CARDIAC ICU monitoring  ID CBC and BMP reviewed, within reference range no signs of sepsis  GI/Nutrition GI PROPHYLAXIS as indicated DIET-->TF's as tolerated Constipation protocol as indicated  ENDO - ICU hypoglycemic\Hyperglycemia protocol -check FSBS per protocol   ELECTROLYTES -follow labs as needed -replace as needed -pharmacy consultation and following   DVT/GI PRX ordered TRANSFUSIONS AS NEEDED MONITOR FSBS ASSESS the need for LABS as needed    Ottie Glazier, M.D.  Pulmonary & Critical Care Medicine  [facility]     Ottie Glazier, M.D.  Pulmonary & [place]

## 2018-05-05 NOTE — Progress Notes (Signed)
[place] at [place] NAME: Peter Johnston    MR#:  [id number]  DATE OF BIRTH:  Jul 05, 1940  SUBJECTIVE:  CHIEF COMPLAINT:   Chief Complaint  Patient presents with  . Shortness of Breath   No new complaint this morning.  Patient requiring oxygen via high flow.  Notified by case manager that trilogy was already approved for patient for use on discharge home.  REVIEW OF SYSTEMS:  Review of Systems  Constitutional: Negative for chills and fever.  HENT: Negative for ear pain and hearing loss.   Eyes: Negative for blurred vision and double vision.  Respiratory: Positive for shortness of breath. Negative for hemoptysis.   Cardiovascular: Negative for chest pain, palpitations and orthopnea.  Gastrointestinal: Negative for heartburn.  Genitourinary: Negative for dysuria and urgency.  Musculoskeletal: Negative for back pain and myalgias.  Skin: Negative for itching and rash.  Neurological: Negative for dizziness.  Psychiatric/Behavioral: Negative for depression and hallucinations.    DRUG ALLERGIES:   Allergies  Allergen Reactions  . Tamsulosin Shortness Of Breath and Other (See Comments)    dizziness  . Cefdinir Other (See Comments)    SOB  . Gold-Containing Drug Products Other (See Comments)    Proteinuria  . Hydrochlorothiazide Other (See Comments)    BP dropped  . Sulfa Antibiotics Nausea Only   VITALS:  Blood pressure 110/75, pulse (!) 104, temperature 98.1 F (36.7 C), temperature source Oral, resp. rate (!) 24, height 5\' 8"  (1.727 m), weight 62.7 kg, SpO2 98 %. PHYSICAL EXAMINATION:  Physical Exam   GENERAL:  78 y.o.-year-old patient lying in the bed with no acute distress.  EYES: Pupils equal, round, reactive to light and accommodation. No scleral icterus. Extraocular muscles intact.  HEENT: Head atraumatic, normocephalic. Oropharynx and nasopharynx clear.  NECK:  Supple, no jugular venous distention. No thyroid  enlargement, no tenderness.  LUNGS: diminished air entry bilaterally. CARDIOVASCULAR: S1, S2 normal. No murmurs, rubs, or gallops.  ABDOMEN: Soft, nontender, nondistended. Bowel sounds present. No organomegaly or mass.  EXTREMITIES: No pedal edema, cyanosis, or clubbing.  NEUROLOGIC: Cranial nerves II through XII are intact. Muscle strength 5/5 in all extremities. Sensation intact. Gait not checked.  PSYCHIATRIC: The patient is alert and oriented x 3.  SKIN: No obvious rash, lesion, or ulcer.   LABORATORY PANEL:  Male CBC Recent Labs  Lab 05/05/18 0445  WBC 8.1  HGB 11.1*  HCT 36.5*  PLT 142*   ------------------------------------------------------------------------------------------------------------------ Chemistries  Recent Labs  Lab 05/05/18 0445  NA 141  K 4.1  CL 95*  CO2 43*  GLUCOSE 97  BUN 16  CREATININE 0.50*  CALCIUM 8.8*  MG 1.6*  AST 25  ALT 17  ALKPHOS 67  BILITOT 0.6   RADIOLOGY:  No results found. ASSESSMENT AND PLAN:    78 year old male patient with history of COPD, lung cancer follows Dr. Daun Peacock as an outpatient per pulmonary comes in because of shortness of breath and also confusion found to have hypercapnia.  1 Severe acute hypoxic and hypercapnic respiratory failure secondary to influenza A infection Patient clinically improving with , bronchodilators, steroids ,on azithromycin.  Being managed by critical care physician.  Already weaned off of BiPAP.  Currently on oxygen via high flow.  Patient already assessed and qualifies for use of trilogy at home on discharge from the hospital.  Monitor clinically.   2.  Metabolic encephalopathy secondary to hypercapnia, improved Monitor  3.  COPD exacerbation, continue doxycycline, Solu-Medrol,  duo nebs. Critical, prognosis guarded, patient has underlying severe COPD and follows up with pulmonary. For use of trilogy on discharge from the hospital to decrease risk of mortality and recurrent  admissions.  4.  Hypomagnesemia and hypophosphatemia Being replaced.  5.  History of lung cancer Follow-up with his oncologist post discharge from the hospital  DVT prophylaxis; Lovenox   All the records are reviewed and case discussed with Care Management/Social Worker. Management plans discussed with the patient, family and they are in agreement.  CODE STATUS: Full Code  TOTAL TIME TAKING CARE OF THIS PATIENT: 37 minutes.   More than 50% of the time was spent in counseling/coordination of care: YES  POSSIBLE D/C IN 2 DAYS, DEPENDING ON CLINICAL CONDITION.   Lakeria Starkman M.D on 05/05/2018 at 3:12 PM  Between 7am to 6pm - Pager - [phone]  After 6pm go to [email or web address] - [employer] [place] [facility]  Office  [phone]  CC: Primary care physician; Leone Haven, MD  Note: This dictation was prepared with Dragon dictation along with smaller phrase technology. Any transcriptional errors that result from this process are unintentional.

## 2018-05-05 NOTE — Progress Notes (Signed)
PHARMACY CONSULT NOTE - FOLLOW UP  Pharmacy Consult for Electrolyte Monitoring and Replacement   Recent Labs: Potassium (mmol/L)  Date Value  05/05/2018 4.1  02/01/2012 4.5   Magnesium (mg/dL)  Date Value  05/05/2018 1.6 (L)  01/31/2012 2.0   Calcium (mg/dL)  Date Value  05/05/2018 8.8 (L)   Calcium, Total (mg/dL)  Date Value  02/01/2012 9.2   Albumin (g/dL)  Date Value  05/05/2018 2.7 (L)  01/30/2012 2.8 (L)   Phosphorus (mg/dL)  Date Value  05/05/2018 1.5 (L)   Sodium (mmol/L)  Date Value  05/05/2018 141  02/01/2012 139     Assessment: 78 year old male admitted with respiratory failure  Goal of Therapy:  Potassium ~ 4 Magnesium ~ 2 Phosphorus ~ 2.5  Plan:  Potassium WNL, no supplementation needed. Mg 1.6: will give 2 g IV x 1 Phos 1.5: pt able to tolerate oral meds. Will give K Phos Neutral 2 tablets (500 mg) PO q4h x 4 doses.  Will recheck all electrolytes with morning labs.  Tawnya Crook, PharmD Pharmacy Resident  05/05/2018 10:27 AM

## 2018-05-06 LAB — MAGNESIUM: Magnesium: 1.9 mg/dL (ref 1.7–2.4)

## 2018-05-06 LAB — PHOSPHORUS: Phosphorus: 4.2 mg/dL (ref 2.5–4.6)

## 2018-05-06 LAB — BASIC METABOLIC PANEL
Anion gap: 4 — ABNORMAL LOW (ref 5–15)
BUN: 19 mg/dL (ref 8–23)
CO2: 47 mmol/L — ABNORMAL HIGH (ref 22–32)
Calcium: 8.5 mg/dL — ABNORMAL LOW (ref 8.9–10.3)
Chloride: 94 mmol/L — ABNORMAL LOW (ref 98–111)
Creatinine, Ser: 0.46 mg/dL — ABNORMAL LOW (ref 0.61–1.24)
GFR calc Af Amer: >60 mL/min (ref >60)
GFR calc non Af Amer: >60 mL/min (ref >60)
Glucose, Bld: 102 mg/dL — ABNORMAL HIGH (ref 70–99)
Potassium: 4.3 mmol/L (ref 3.5–5.1)
SODIUM: 145 mmol/L (ref 135–145)

## 2018-05-06 NOTE — Progress Notes (Signed)
PHARMACY CONSULT NOTE - FOLLOW UP  Pharmacy Consult for Electrolyte Monitoring and Replacement   Recent Labs: Potassium (mmol/L)  Date Value  05/06/2018 4.3  02/01/2012 4.5   Magnesium (mg/dL)  Date Value  05/06/2018 1.9  01/31/2012 2.0   Calcium (mg/dL)  Date Value  05/06/2018 8.5 (L)   Calcium, Total (mg/dL)  Date Value  02/01/2012 9.2   Albumin (g/dL)  Date Value  05/05/2018 2.7 (L)  01/30/2012 2.8 (L)   Phosphorus (mg/dL)  Date Value  05/06/2018 4.2   Sodium (mmol/L)  Date Value  05/06/2018 145  02/01/2012 139     Assessment: 78 year old male admitted with respiratory failure  Goal of Therapy:  Potassium ~ 4 Magnesium ~ 2 Phosphorus ~ 2.5  Plan:  Electrolytes WNL. No supplementation is warranted at this time.   Will recheck all electrolytes with morning labs.  Olivia Canter, [facility] 05/06/2018 9:40 AM

## 2018-05-06 NOTE — Progress Notes (Signed)
CRITICAL CARE NOTE  CC  follow up respiratory failure  SUBJECTIVE  Prognosis is guarded Resting in bed comfortably, no new complaints.  Overnight events noted.    SIGNIFICANT EVENTS -Weaning down FiO2 transition to nasal cannula   Vitals:   05/06/18 0700 05/06/18 0705  BP: 110/75   Pulse: 94   Resp: 20   Temp:  97.6 F (36.4 C)  SpO2: 96% 95%     REVIEW OF SYSTEMS  10 system ROS conducted and is negative except as per subjective findings  PHYSICAL EXAMINATION:  GENERAL: +resp distress  HEAD: Normocephalic, atraumatic.  EYES: Pupils equal, round, reactive to light.  No scleral icterus.  MOUTH: Moist mucosal membrane. NECK: Supple. No thyromegaly. No nodules. No JVD.  PULMONARY: +rhonchi at bases CARDIOVASCULAR: S1 and S2. Regular rate and rhythm. No murmurs, rubs, or gallops.  GASTROINTESTINAL: Soft, nontender, -distended. No masses. Positive bowel sounds. No hepatosplenomegaly.  MUSCULOSKELETAL: No swelling, clubbing, or edema.  NEUROLOGIC: Mild distress due to acute illness SKIN:intact,warm,dry   Labs and Imaging:     -I personally reviewed most recent blood work, imaging and microbiology - significant findings today are metabolic alkalosis likely due to chronic CO2 retention  LAB RESULTS: Recent Labs  Lab 05/03/18 0540 05/05/18 0445 05/06/18 0504  NA 139 141 145  K 4.2 4.1 4.3  CL 98 95* 94*  CO2 36* 43* 47*  BUN 29* 16 19  CREATININE 0.62 0.50* 0.46*  GLUCOSE 128* 97 102*   Recent Labs  Lab 05/01/18 0502 05/03/18 0540 05/05/18 0445  HGB 11.6* 11.6* 11.1*  HCT 37.8* 37.9* 36.5*  WBC 8.1 8.7 8.1  PLT 206 156 142*     IMAGING RESULTS: No results found.   ASSESSMENT AND PLAN SYNOPSIS Severe Hypoxic and Hypercapnic Respiratory Failure -Due to influenza A infection -On Tamiflu -Transition to nasal cannula- to DC to medical floor -continue Bronchodilator Therapy    Acute COPD exacerbation Zithromax500 dailyas well as  p.o.  Pred -Bibasilar atelectasis-sent spirometerand vest therapy -Pulmonary arterial hypertension and pulmonary nodules on outpatient with Dr. Chancy Milroy pulmonary  Hypomagnesemia and hypo-phosphatemia -Repleted -Pharmacology consultation  CARDIAC ICU monitoring  ID CBC and BMP reviewed, within reference range no signs of sepsis  GI/Nutrition GI PROPHYLAXIS as indicated DIET-->TF's as tolerated Constipation protocol as indicated  ENDO - ICU hypoglycemic\Hyperglycemia protocol -check FSBS per protocol   ELECTROLYTES -follow labs as needed -replace as needed -pharmacy consultation and following   DVT/GI PRX ordered TRANSFUSIONS AS NEEDED MONITOR FSBSThis document was prepared using Dragon voice recognition software and may include unintentional dictation errors.   Ottie Glazier, M.D.  [facility] [place]

## 2018-05-06 NOTE — Progress Notes (Signed)
[facility] NAME: Peter Johnston    MR#:  [id number]  DATE OF BIRTH:  1940-08-12  SUBJECTIVE:  CHIEF COMPLAINT:   Chief Complaint  Patient presents with  . Shortness of Breath   No new complaint this morning.  Patient weaned off high flow oxygen to oxygen via nasal cannula.  Trilogy already approved for home use on discharge from the hospital  REVIEW OF SYSTEMS:  Review of Systems  Constitutional: Negative for chills and fever.  HENT: Negative for ear pain and hearing loss.   Eyes: Negative for blurred vision and double vision.  Respiratory: Positive for shortness of breath. Negative for hemoptysis.   Cardiovascular: Negative for chest pain, palpitations and orthopnea.  Gastrointestinal: Negative for heartburn.  Genitourinary: Negative for dysuria and urgency.  Musculoskeletal: Negative for back pain and myalgias.  Skin: Negative for itching and rash.  Neurological: Negative for dizziness.  Psychiatric/Behavioral: Negative for depression and hallucinations.    DRUG ALLERGIES:   Allergies  Allergen Reactions  . Tamsulosin Shortness Of Breath and Other (See Comments)    dizziness  . Cefdinir Other (See Comments)    SOB  . Gold-Containing Drug Products Other (See Comments)    Proteinuria  . Hydrochlorothiazide Other (See Comments)    BP dropped  . Sulfa Antibiotics Nausea Only   VITALS:  Blood pressure 124/76, pulse (!) 106, temperature 97.9 F (36.6 C), temperature source Axillary, resp. rate (!) 22, height 5\' 8"  (1.727 m), weight 61.7 kg, SpO2 100 %. PHYSICAL EXAMINATION:  Physical Exam   GENERAL:  78 y.o.-year-old patient lying in the bed with no acute distress.  EYES: Pupils equal, round, reactive to light and accommodation. No scleral icterus. Extraocular muscles intact.  HEENT: Head atraumatic, normocephalic. Oropharynx and nasopharynx clear.  NECK:  Supple, no jugular venous distention. No thyroid  enlargement, no tenderness.  LUNGS: diminished air entry bilaterally. CARDIOVASCULAR: S1, S2 normal. No murmurs, rubs, or gallops.  ABDOMEN: Soft, nontender, nondistended. Bowel sounds present. No organomegaly or mass.  EXTREMITIES: No pedal edema, cyanosis, or clubbing.  NEUROLOGIC: Cranial nerves II through XII are intact. Muscle strength 5/5 in all extremities. Sensation intact. Gait not checked.  PSYCHIATRIC: The patient is alert and oriented x 3.  SKIN: No obvious rash, lesion, or ulcer.   LABORATORY PANEL:  Male CBC Recent Labs  Lab 05/05/18 0445  WBC 8.1  HGB 11.1*  HCT 36.5*  PLT 142*   ------------------------------------------------------------------------------------------------------------------ Chemistries  Recent Labs  Lab 05/05/18 0445 05/06/18 0504  NA 141 145  K 4.1 4.3  CL 95* 94*  CO2 43* 47*  GLUCOSE 97 102*  BUN 16 19  CREATININE 0.50* 0.46*  CALCIUM 8.8* 8.5*  MG 1.6* 1.9  AST 25  --   ALT 17  --   ALKPHOS 67  --   BILITOT 0.6  --    RADIOLOGY:  No results found. ASSESSMENT AND PLAN:    78 year old male patient with history of COPD, lung cancer follows Dr. Daun Peacock as an outpatient per pulmonary comes in because of shortness of breath and also confusion found to have hypercapnia.  1 Severe acute hypoxic and hypercapnic respiratory failure secondary to influenza A infection Patient clinically improving with , bronchodilators, treated with Tamiflu ,steroids ,on azithromycin.  Being managed by critical care physician.  Already weaned off of BiPAP on high flow oxygen.  Currently on oxygen via nasal cannula.  Patient already assessed and qualifies for use of  trilogy at home on discharge from the hospital.  Monitor clinically.   2.  Metabolic encephalopathy secondary to hypercapnia; minimal improvement Monitor  3.  COPD exacerbation, continue doxycycline, Solu-Medrol, duo nebs. Critical, prognosis guarded, patient has underlying severe  COPD and follows up with pulmonary. For use of trilogy on discharge from the hospital to decrease risk of mortality and recurrent admissions.  4.  Hypomagnesemia and hypophosphatemia Replaced.  5.  History of lung cancer Follow-up with his oncologist post discharge from the hospital  DVT prophylaxis; Lovenox   All the records are reviewed and case discussed with Care Management/Social Worker. Management plans discussed with the patient, family and they are in agreement.  CODE STATUS: Full Code   TOTAL TIME TAKING CARE OF THIS PATIENT: 49minutes.   More than 50% of the time was spent in counseling/coordination of care: YES  POSSIBLE D/C IN 2 DAYS, DEPENDING ON CLINICAL CONDITION.   Peter Johnston M.D on 05/06/2018 at 1:13 PM  Between 7am to 6pm - Pager - [phone]  After 6pm go to [email or web address] - Technical [employer]wer [facility]  Office  [phone]  CC: Primary care physician; Leone Haven, MD  Note: This dictation was prepared with Dragon dictation along with smaller phrase technology. Any transcriptional errors that result from this process are unintentional.

## 2018-05-07 ENCOUNTER — Inpatient Hospital Stay: Payer: Medicare Other

## 2018-05-07 ENCOUNTER — Encounter: Payer: Self-pay | Admitting: Primary Care

## 2018-05-07 DIAGNOSIS — J9621 Acute and chronic respiratory failure with hypoxia: Secondary | ICD-10-CM

## 2018-05-07 DIAGNOSIS — Z515 Encounter for palliative care: Secondary | ICD-10-CM

## 2018-05-07 DIAGNOSIS — Z7189 Other specified counseling: Secondary | ICD-10-CM

## 2018-05-07 DIAGNOSIS — J9622 Acute and chronic respiratory failure with hypercapnia: Secondary | ICD-10-CM

## 2018-05-07 LAB — BLOOD GAS, ARTERIAL
Acid-Base Excess: 22.3 mmol/L — ABNORMAL HIGH (ref 0.0–2.0)
Bicarbonate: 53.2 mmol/L — ABNORMAL HIGH (ref 20.0–28.0)
FIO2: 0.6
O2 Saturation: 86 %
PH ART: 7.37 (ref 7.350–7.450)
Patient temperature: 37
pCO2 arterial: 92 mmHg (ref 32.0–48.0)
pO2, Arterial: 53 mmHg — ABNORMAL LOW (ref 83.0–108.0)

## 2018-05-07 LAB — GLUCOSE, CAPILLARY: Glucose-Capillary: 73 mg/dL (ref 70–99)

## 2018-05-07 LAB — BASIC METABOLIC PANEL
Anion gap: 5 (ref 5–15)
BUN: 19 mg/dL (ref 8–23)
CO2: 44 mmol/L — ABNORMAL HIGH (ref 22–32)
CREATININE: 0.51 mg/dL — AB (ref 0.61–1.24)
Calcium: 8.5 mg/dL — ABNORMAL LOW (ref 8.9–10.3)
Chloride: 94 mmol/L — ABNORMAL LOW (ref 98–111)
GFR calc non Af Amer: >60 mL/min (ref >60)
Glucose, Bld: 93 mg/dL (ref 70–99)
Potassium: 4.9 mmol/L (ref 3.5–5.1)
Sodium: 143 mmol/L (ref 135–145)

## 2018-05-07 LAB — PHOSPHORUS: Phosphorus: 3.9 mg/dL (ref 2.5–4.6)

## 2018-05-07 LAB — MAGNESIUM: MAGNESIUM: 1.8 mg/dL (ref 1.7–2.4)

## 2018-05-07 NOTE — Progress Notes (Signed)
Dr Lonell Face notified that patient has critical blood gas

## 2018-05-07 NOTE — Care Management (Signed)
Notified physician that PCO2 is pending a result, NIV needs prior auth from insurance and PCO2 is needed before can get [place]

## 2018-05-07 NOTE — Plan of Care (Signed)

## 2018-05-07 NOTE — Care Management (Signed)
Contacted Respiratory per request from Dr. Stark Jock, requested a new ABG. She stated that she would get one

## 2018-05-07 NOTE — Consult Note (Signed)
Consultation Note Date: 05/07/2018   Patient Name: Peter Johnston  DOB: 06-19-1940  MRN: [id number]  Age / Sex: 78 y.o., male  PCP: Leone Haven, MD Referring Physician: Otila Back, MD  Reason for Consultation: Establishing goals of care and Psychosocial/spiritual support  HPI/Patient Profile: 78 y.o. male  with past medical history of lung cancer, COPD, chronic respiratory failure on home oxygen, high blood pressure, iron deficiency anemia, arthritis, former smoker, current alcohol use admitted on 05/24/2018 with severe acute hypoxic and hypercapnic respiratory failure secondary to influenza A infection.   Clinical Assessment and Goals of Care: Peter Johnston is sitting up in bed.  He makes but does not keep eye contact, he appears frail and is unable to make a full sentence due to shortness of breath.  He is alert and oriented x3, and is able to make his basic needs known.  There is no family at bedside at this time.  Call to son, Taft Worthing.  We talk about HCPOA, [name] tells me that he is the surrogate decision maker for his father.  We talked about CODE STATUS.  [name] states that at this point, he wants to respect Peter Johnston wishes, "do everything you can to keep him alive".  [name] states twice that he wonders if his father should have been moved out of intensive care yesterday.  I share that we are continuing the same treatment plan, and I expect some waxing and waning with his acute illness.  I share that [name] has "hits" against him already with his chronic health/lung problems.  We plan for a family meeting at bedside 2/11 at 10:30.   We talked about healthcare power of attorney, see below. We talked about CODE STATUS, see below.  Healthcare power of attorney NEXT OF KIN -Peter Johnston names his son Kylen Schliep as his healthcare surrogate.  He shares that Peter Johnston lives in his home.  He states that  his significant other of 10 years, [name] also lives in his home and can make healthcare choices.   SUMMARY OF RECOMMENDATIONS   At this point full code/full scope Continue CODE STATUS discussions. Anticipate need for rehab. Would benefit from outpatient palliative  Code Status/Advance Care Planning:  Full code -we talked about CPR and intubation.  Peter Johnston shares that he does not want life support but would take it.  He is too frail to continue these discussions without family support.  Symptom Management:   Per hospitalist, no additional needs at this time.  Palliative Prophylaxis:   Frequent Pain Assessment and Turn Reposition  Additional Recommendations (Limitations, Scope, Preferences):  Full Scope Treatment  Psycho-social/Spiritual:   Desire for further Chaplaincy support:no  Additional Recommendations: Caregiving  Support/Resources and Education on Hospice  Prognosis:   Unable to determine, based on outcomes.  Tenuous respiratory status at this point.  Discharge Planning: To be determined, based on outcomes.  Anticipate need for rehab.      Primary Diagnoses: Present on Admission: . COPD exacerbation ([place])   I  have reviewed the medical record, interviewed the patient and family, and examined the patient. The following aspects are pertinent.  Past Medical History:  Diagnosis Date  . Arthritis   . Asthma   . COPD (chronic obstructive pulmonary disease) ([place])   . Hypertension   . IDA (iron deficiency anemia)   . Lung mass    Social History   Socioeconomic History  . Marital status: Widowed    Spouse name: Not on file  . Number of children: 2  . Years of education: Not on file  . Highest education level: Not on file  Occupational History  . Occupation: Retired   Scientific laboratory technician  . Financial resource strain: Patient refused  . Food insecurity:    Worry: Patient refused    Inability: Patient refused  . Transportation needs:    Medical:  Patient refused    Non-medical: Patient refused  Tobacco Use  . Smoking status: Former Smoker    Packs/day: 2.00    Years: 63.00    Pack years: 126.00    Types: Cigarettes    Last attempt to quit: 01/22/2014    Years since quitting: 4.2  . Smokeless tobacco: Never Used  . Tobacco comment: Stopped smoking cigarettes Sunday and uses the vapor e-cig to help with cravings  Substance and Sexual Activity  . Alcohol use: Yes    Alcohol/week: 15.0 standard drinks    Types: 15 Standard drinks or equivalent per week    Comment: 2-3 beers at night  . Drug use: No  . Sexual activity: Yes  Lifestyle  . Physical activity:    Days per week: Patient refused    Minutes per session: Patient refused  . Stress: Patient refused  Relationships  . Social connections:    Talks on phone: Patient refused    Gets together: Patient refused    Attends religious service: Patient refused    Active member of club or organization: Patient refused    Attends meetings of clubs or organizations: Patient refused    Relationship status: Patient refused  Other Topics Concern  . Not on file  Social History Narrative   Lives in [place] with son.      2 children, 2 grandchildren            Family History  Problem Relation Age of Onset  . Stroke Father   . Heart disease Father   . Hypertension Father   . Heart disease Brother 50   Scheduled Meds: . enoxaparin (LOVENOX) injection  40 mg Subcutaneous Q24H  . latanoprost  1 drop Both Eyes BID  . predniSONE  50 mg Oral Q breakfast  . sodium chloride flush  3 mL Intravenous Q12H   Continuous Infusions: PRN Meds:.acetaminophen **OR** acetaminophen, hydrALAZINE, ondansetron **OR** ondansetron (ZOFRAN) IV, polyethylene glycol, sodium chloride, traMADol Medications Prior to Admission:  Prior to Admission medications   Medication Sig Start Date End Date Taking? Authorizing Provider  acetaminophen (TYLENOL) 500 MG tablet Take 500 mg by mouth daily.    Yes  [provider]  allopurinol (ZYLOPRIM) 300 MG tablet Take 300 mg by mouth daily.   Yes [provider]  atenolol (TENORMIN) 25 MG tablet TAKE 1 TABLET DAILY Patient taking differently: Take 12.5 mg by mouth daily.  01/09/17  Yes Leone Haven, MD  bimatoprost (LUMIGAN) 0.01 % SOLN Place 1 drop into both eyes 2 (two) times daily.   Yes [provider]  esomeprazole (NEXIUM) 20 MG capsule Take 1 capsule (20 mg  total) by mouth daily before breakfast. 02/07/18  Yes Leone Haven, MD  hydroxychloroquine (PLAQUENIL) 200 MG tablet Take 200 mg by mouth daily.  01/19/18 01/19/19 Yes [provider]  ipratropium-albuterol (DUONEB) 0.5-2.5 (3) MG/3ML SOLN Inhale 3 mLs into the lungs every 4 (four) hours as needed. Patient taking differently: Inhale 3 mLs into the lungs every 4 (four) hours as needed (for wheezing/shortness of breath).  11/30/16  Yes Cook, Jayce G, DO  predniSONE (DELTASONE) 10 MG tablet TAKE 1 TABLET BY MOUTH  DAILY WITH BREAKFAST Patient taking differently: Take 10 mg by mouth daily.  01/01/18  Yes Allyne Gee, MD  theophylline (UNIPHYL) 400 MG 24 hr tablet TAKE 1 TABLET BY MOUTH  DAILY 01/29/18  Yes Allyne Gee, MD  traMADol (ULTRAM) 50 MG tablet Take 50 mg by mouth daily. 06/15/17  Yes [provider]  Travoprost, BAK Free, (TRAVATAN) 0.004 % SOLN ophthalmic solution Place 1 drop into both eyes at bedtime.   Yes [provider]  triamcinolone cream (KENALOG) 0.5 % Apply 1 application topically 2 (two) times daily. 01/15/18  Yes [provider]  umeclidinium-vilanterol (ANORO ELLIPTA) 62.5-25 MCG/INH AEPB Inhale 1 puff into the lungs daily. 01/30/18  Yes Scarboro, Audie Clear, NP  albuterol (PROVENTIL HFA;VENTOLIN HFA) 108 (90 Base) MCG/ACT inhaler Inhale 2 puffs into the lungs every 6 (six) hours as needed for wheezing or shortness of breath. 12/05/16   Cammie Sickle, MD  fluticasone (FLONASE) 50 MCG/ACT nasal spray  Place 2 sprays into both nostrils daily. 02/07/18   Leone Haven, MD  furosemide (LASIX) 20 MG tablet Take 1 tablet by mouth daily as needed for edema.     [provider]   Allergies  Allergen Reactions  . Tamsulosin Shortness Of Breath and Other (See Comments)    dizziness  . Cefdinir Other (See Comments)    SOB  . Gold-Containing Drug Products Other (See Comments)    Proteinuria  . Hydrochlorothiazide Other (See Comments)    BP dropped  . Sulfa Antibiotics Nausea Only   Review of Systems  Unable to perform ROS: Severe respiratory distress    Physical Exam Vitals signs and nursing note reviewed.  Constitutional:      Comments: Appears weak and frail, unable to make a full sentence, will make but not keep eye contact  HENT:     Head: Normocephalic.     Comments: Dark bruising under bilateral eyes Cardiovascular:     Rate and Rhythm: Normal rate.  Pulmonary:     Effort: Respiratory distress present.     Comments: Unable to make a full sentence Skin:    General: Skin is warm and dry.  Neurological:     Comments: Oriented to person place and time  Psychiatric:        Mood and Affect: Mood is not anxious.        Behavior: Behavior is not agitated.     Vital Signs: BP (!) 104/53 (BP Location: Right Arm)   Pulse 92   Temp 98.3 F (36.8 C) (Oral)   Resp 20   Ht 5\' 8"  (1.727 m)   Wt 63.2 kg   SpO2 91%   BMI 21.19 kg/m  Pain Scale: PAINAD POSS *See Group Information*: 1-Acceptable,Awake and alert Pain Score: 0-No pain   SpO2: SpO2: 91 % O2 Device:SpO2: 91 % O2 Flow Rate: .O2 Flow Rate (L/min): 10 L/min  IO: Intake/output summary:   Intake/Output Summary (Last 24 hours) at 05/07/2018  1323 Last data filed at 05/07/2018 0834 Gross per 24 hour  Intake -  Output 525 ml  Net -525 ml    LBM: Last BM Date: 05/06/18 Baseline Weight: Weight: 65.8 kg Most recent weight: Weight: 63.2 kg     Palliative Assessment/Data:   Flowsheet Rows     Most  Recent Value  Intake Tab  Referral Department  Hospitalist  Unit at Time of Referral  Med/Surg Unit  Palliative Care Primary Diagnosis  Pulmonary  Date Notified  05/06/18  Palliative Care Type  New Palliative care  Reason for referral  Clarify Goals of Care  Date of Admission  05/07/2018  Date first seen by Palliative Care  05/07/18  # of days Palliative referral response time  1 Day(s)  # of days IP prior to Palliative referral  6  Clinical Assessment  Palliative Performance Scale Score  30%  Pain Max last 24 hours  Not able to report  Pain Min Last 24 hours  Not able to report  Dyspnea Max Last 24 Hours  Not able to report  Dyspnea Min Last 24 hours  Not able to report  Psychosocial & Spiritual Assessment  Palliative Care Outcomes  Patient/Family meeting held?  Yes  Who was at the meeting?  Patient at bedside  Palliative Care Outcomes  Clarified goals of care, Provided psychosocial or spiritual support      Time In: 1110 Time Out: 1200 Time Total: 50 minutes Greater than 50%  of this time was spent counseling and coordinating care related to the above assessment and plan.  Signed by: Drue Novel, NP   Please contact Palliative Medicine Team phone at [phone] for questions and concerns.  For individual provider: See Shea Evans

## 2018-05-07 NOTE — Progress Notes (Signed)
[place] [facility] [place] NAME: Peter Johnston    MR#:  [id number]  DATE OF BIRTH:  1941/01/06  SUBJECTIVE:  CHIEF COMPLAINT:   Chief Complaint  Patient presents with  . Shortness of Breath   No new complaint this morning.  Patient weaned off high flow oxygen to oxygen via nasal cannula and transferred out of ICU recently. Patient reported to have become more short of breath last night and oxygen requirement increased to 10 L.  Stat ABG and chest x-ray done this morning.   REVIEW OF SYSTEMS:  Review of Systems  Constitutional: Negative for chills and fever.  HENT: Negative for ear pain and hearing loss.   Eyes: Negative for blurred vision and double vision.  Respiratory: Positive for shortness of breath. Negative for hemoptysis.   Cardiovascular: Negative for chest pain, palpitations and orthopnea.  Gastrointestinal: Negative for heartburn.  Genitourinary: Negative for dysuria and urgency.  Musculoskeletal: Negative for back pain and myalgias.  Skin: Negative for itching and rash.  Neurological: Negative for dizziness.  Psychiatric/Behavioral: Negative for depression and hallucinations.    DRUG ALLERGIES:   Allergies  Allergen Reactions  . Tamsulosin Shortness Of Breath and Other (See Comments)    dizziness  . Cefdinir Other (See Comments)    SOB  . Gold-Containing Drug Products Other (See Comments)    Proteinuria  . Hydrochlorothiazide Other (See Comments)    BP dropped  . Sulfa Antibiotics Nausea Only   VITALS:  Blood pressure (!) 104/53, pulse 92, temperature 98.3 F (36.8 C), temperature source Oral, resp. rate 20, height 5\' 8"  (1.727 m), weight 63.2 kg, SpO2 91 %. PHYSICAL EXAMINATION:  Physical Exam   GENERAL:  78 y.o.-year-old patient lying in the bed with no acute distress.  EYES: Pupils equal, round, reactive to light and accommodation. No scleral icterus. Extraocular muscles intact.  HEENT: Head atraumatic,  normocephalic. Oropharynx and nasopharynx clear.  NECK:  Supple, no jugular venous distention. No thyroid enlargement, no tenderness.  LUNGS: diminished air entry bilaterally. CARDIOVASCULAR: S1, S2 normal. No murmurs, rubs, or gallops.  ABDOMEN: Soft, nontender, nondistended. Bowel sounds present. No organomegaly or mass.  EXTREMITIES: No pedal edema, cyanosis, or clubbing.  NEUROLOGIC: Cranial nerves II through XII are intact. Muscle strength 5/5 in all extremities. Sensation intact. Gait not checked.  PSYCHIATRIC: The patient is alert and oriented x 3.  SKIN: No obvious rash, lesion, or ulcer.   LABORATORY PANEL:  Male CBC Recent Labs  Lab 05/05/18 0445  WBC 8.1  HGB 11.1*  HCT 36.5*  PLT 142*   ------------------------------------------------------------------------------------------------------------------ Chemistries  Recent Labs  Lab 05/05/18 0445  05/07/18 0314  NA 141   < > 143  K 4.1   < > 4.9  CL 95*   < > 94*  CO2 43*   < > 44*  GLUCOSE 97   < > 93  BUN 16   < > 19  CREATININE 0.50*   < > 0.51*  CALCIUM 8.8*   < > 8.5*  MG 1.6*   < > 1.8  AST 25  --   --   ALT 17  --   --   ALKPHOS 67  --   --   BILITOT 0.6  --   --    < > = values in this interval not displayed.   RADIOLOGY:  Dg Chest 1 View  Result Date: 05/07/2018 CLINICAL DATA:  Short of breath.  History of COPD.  Former smoker. EXAM: CHEST  1 VIEW COMPARISON:  05/22/2018 and older exams. FINDINGS: Mild enlargement of the cardiopericardial silhouette, stable. No mediastinal or hilar masses. Lungs are hyperexpanded. Extensive emphysema with a marked paucity of markings in the upper lungs. There is lower lung zone bronchovascular crowding and chronic scarring. No evidence of pneumonia or pulmonary edema. No pleural effusion or pneumothorax. Skeletal structures are grossly intact. IMPRESSION: 1. No acute cardiopulmonary disease. 2. Advanced COPD/emphysema with areas of lower lung zone chronic scarring.  Electronically Signed   By: Lajean Manes M.D.   On: 05/07/2018 10:30   ASSESSMENT AND PLAN:    78 year old male patient with history of COPD, lung cancer follows Dr. Daun Peacock as an outpatient per pulmonary comes in because of shortness of breath and also confusion found to have hypercapnia.  1 Severe acute hypoxic and hypercapnic respiratory failure secondary to influenza A infection Patient clinically improving with , bronchodilators, treated with Tamiflu ,steroids ,on azithromycin.  Being managed by critical care physician.  Already weaned off of BiPAP on high flow oxygen.  Currently on oxygen via nasal cannula.  Oxygen requirement had to be increased to 10 L overnight. Stat ABG with elevated PCO2 with normal pH suggestive of chronic and compensated hypercapnia.  Stat chest x-ray with no acute cardiopulmonary disease , with evidence of advanced COPD and emphysema. Case manager working on setting up trilogy for home use on discharge from the hospital Monitor clinically.   2.  Metabolic encephalopathy secondary to hypercapnia; improved .Monitor  3.  COPD exacerbation, recently treated with antibiotics with doxycycline, Solu-Medrol and now on p.o. prednisone with gradual taper., duo nebs. Critical, prognosis guarded, patient has underlying severe COPD and follows up with pulmonary. For use of trilogy on discharge from the hospital to decrease risk of mortality and recurrent admissions.  4.  Hypomagnesemia and hypophosphatemia Replaced.  5.  History of lung cancer Follow-up with his oncologist post discharge from the hospital  We will request for physical therapy to evaluate and treat in a.m. if respiratory status more stable  DVT prophylaxis; Lovenox   All the records are reviewed and case discussed with Care Management/Social Worker. Management plans discussed with the patient, family and they are in agreement.  CODE STATUS: Full Code   TOTAL TIME TAKING CARE OF THIS  PATIENT: 54minutes.   More than 50% of the time was spent in counseling/coordination of care: YES  POSSIBLE D/C IN 2 DAYS, DEPENDING ON CLINICAL CONDITION.   Mayelin Panos M.D on 05/07/2018 at 12:36 PM  Between 7am to 6pm - Pager - [phone]  After 6pm go to [email or web address] - Technical brewer [facility]  Office  [phone]  CC: Primary care physician; Leone Haven, MD  Note: This dictation was prepared with Dragon dictation along with smaller phrase technology. Any transcriptional errors that result from this process are unintentional.

## 2018-05-07 NOTE — Progress Notes (Signed)
[facility] for Electrolyte Monitoring and Replacement   Recent Labs: Potassium (mmol/L)  Date Value  05/07/2018 4.9  02/01/2012 4.5   Magnesium (mg/dL)  Date Value  05/07/2018 1.8  01/31/2012 2.0   Calcium (mg/dL)  Date Value  05/07/2018 8.5 (L)   Calcium, Total (mg/dL)  Date Value  02/01/2012 9.2   Albumin (g/dL)  Date Value  05/05/2018 2.7 (L)  01/30/2012 2.8 (L)   Phosphorus (mg/dL)  Date Value  05/07/2018 3.9   Sodium (mmol/L)  Date Value  05/07/2018 143  02/01/2012 139     Assessment: 78 year old male admitted with respiratory failure  Goal of Therapy:  Potassium ~ 4 Magnesium ~ 2 Phosphorus ~ 2.5  Plan:  Electrolytes WNL. No supplementation is warranted at this time. Because this consult was initiated in CCU pharmacy will sign off.  Dallie Piles, PharmD 05/07/2018 9:07 AM

## 2018-05-08 LAB — BASIC METABOLIC PANEL
Anion gap: 6 (ref 5–15)
BUN: 19 mg/dL (ref 8–23)
CO2: 44 mmol/L — ABNORMAL HIGH (ref 22–32)
CREATININE: 0.49 mg/dL — AB (ref 0.61–1.24)
Calcium: 8.7 mg/dL — ABNORMAL LOW (ref 8.9–10.3)
Chloride: 92 mmol/L — ABNORMAL LOW (ref 98–111)
GFR calc non Af Amer: >60 mL/min (ref >60)
Glucose, Bld: 87 mg/dL (ref 70–99)
Potassium: 4.6 mmol/L (ref 3.5–5.1)
Sodium: 142 mmol/L (ref 135–145)

## 2018-05-08 LAB — MAGNESIUM: Magnesium: 1.7 mg/dL (ref 1.7–2.4)

## 2018-05-08 MED ORDER — UMECLIDINIUM-VILANTEROL 62.5-25 MCG/INH IN AEPB
1.0000 | INHALATION_SPRAY | Freq: Every day | RESPIRATORY_TRACT | Status: DC
  Administered 2018-05-08: 1 via RESPIRATORY_TRACT
  Filled 2018-05-08: qty 14

## 2018-05-08 NOTE — Progress Notes (Signed)
Palliative: Peter Johnston is resting quietly in bed.  He appears ill and frail and has work of breathing.  He is again today unable to make a full sentence without pausing for breath.  Present today at bedside is son, Peter Johnston who lives in the home with Peter Johnston along with his girlfriend of 10 years, Peter Johnston.  Peter Johnston shares that he is not doing so well, and Peter Johnston and I agree.  I share that although his oxygen has been reduced from 10 L to 8, he is still "not out of the woods".  Peter Johnston and Peter Johnston agree.  We talked about Peter Johnston home life.  Peter Johnston shares that he and Peter Mood do most of the work around the home.  He shares that Peter Johnston is able to do some light cooking, make peanut butter crackers, but is unable to do any real housework.  We talked about rehab if/when Peter Johnston is strong enough.  They state that he had rehab at home in the past, but are agreeable to in facility rehab if qualified.  I share that he is too weak for PT eval at this point.  I share my concern that if Peter Johnston respiratory status declines, would he want life support, intubation/ventilation.  At this point Peter Johnston is unable to answer.  I ask him if it is okay if Peter Johnston makes choices for him.  Peter Johnston quickly agrees that he wants Peter Johnston to make healthcare choices if he cannot.  I share with Peter Johnston that this is a gift his father is giving him, he has the ability to make a different choice if needed.  At this point, they would accept BiPAP, but son Peter Johnston needs to be called before intubation occurs.    During our conversation, Peter Johnston will drift off into what looks to be sleep, but he will awaken easily when prompted.  Peter Johnston and I moved to the side to discuss the treatment plan in detail.  We talked about the treatment plan including medications, respiratory treatments, and labs.   Palliative medicine team will follow-up on Thursday. Conference with nursing staff related to goals of care discussion, patient frailty and  needs. Conference with hospitalist related to goals of care discussion, CODE STATUS discussion.  3 minutes  Peter Axe, NP  Palliative Medicine Team  Team Phone # [phone]  Greater than 50% of this time was spent counseling and coordinating care related to the above assessment and plan.

## 2018-05-08 NOTE — Progress Notes (Signed)
PT Cancellation Note  Patient Details Name: Peter Johnston MRN: [id number] DOB: 1940-12-09   Cancelled Treatment:    Reason Eval/Treat Not Completed: Medical issues which prohibited therapy(Consult received and chart reviewed.  Per palliative care note this date, "he is too weak for PT eval at this point".  Discussed with primary RN who is in agreement.  Patient currently unable to complete sentence due to SOB; unable to tolerate any level of exertional activity at this time.  Will continue to follow with re-attempt next date and will initiate as medically appropriate and patient able to tolerate/participate.)   Zhamir Pirro H. Owens Shark, PT, DPT, NCS 05/08/18, 2:46 PM [phone]

## 2018-05-08 NOTE — Progress Notes (Signed)
[place] at [place] NAME: Peter Johnston    MR#:  [id number]  DATE OF BIRTH:  1940/08/21  SUBJECTIVE:  CHIEF COMPLAINT:   Chief Complaint  Patient presents with  . Shortness of Breath   No new complaint this morning.  Patient was transferred out of ICU recently. Oxygen requirement weaned down to 8 L  REVIEW OF SYSTEMS:  Review of Systems  Constitutional: Negative for chills and fever.  HENT: Negative for ear pain and hearing loss.   Eyes: Negative for blurred vision and double vision.  Respiratory: Positive for shortness of breath. Negative for hemoptysis.   Cardiovascular: Negative for chest pain, palpitations and orthopnea.  Gastrointestinal: Negative for heartburn.  Genitourinary: Negative for dysuria and urgency.  Musculoskeletal: Negative for back pain and myalgias.  Skin: Negative for itching and rash.  Neurological: Negative for dizziness.  Psychiatric/Behavioral: Negative for depression and hallucinations.    DRUG ALLERGIES:   Allergies  Allergen Reactions  . Tamsulosin Shortness Of Breath and Other (See Comments)    dizziness  . Cefdinir Other (See Comments)    SOB  . Gold-Containing Drug Products Other (See Comments)    Proteinuria  . Hydrochlorothiazide Other (See Comments)    BP dropped  . Sulfa Antibiotics Nausea Only   VITALS:  Blood pressure 128/66, pulse (!) 109, temperature 97.9 F (36.6 C), temperature source Oral, resp. rate 18, height 5\' 8"  (1.727 m), weight 62.4 kg, SpO2 93 %. PHYSICAL EXAMINATION:  Physical Exam   GENERAL:  78 y.o.-year-old patient lying in the bed with no acute distress.  EYES: Pupils equal, round, reactive to light and accommodation. No scleral icterus. Extraocular muscles intact.  HEENT: Head atraumatic, normocephalic. Oropharynx and nasopharynx clear.  NECK:  Supple, no jugular venous distention. No thyroid enlargement, no tenderness.  LUNGS: diminished air entry  bilaterally. CARDIOVASCULAR: S1, S2 normal. No murmurs, rubs, or gallops.  ABDOMEN: Soft, nontender, nondistended. Bowel sounds present. No organomegaly or mass.  EXTREMITIES: No pedal edema, cyanosis, or clubbing.  NEUROLOGIC: Cranial nerves II through XII are intact. Muscle strength 5/5 in all extremities. Sensation intact. Gait not checked.  PSYCHIATRIC: The patient is alert and oriented x 3.  SKIN: No obvious rash, lesion, or ulcer.   LABORATORY PANEL:  Male CBC Recent Labs  Lab 05/05/18 0445  WBC 8.1  HGB 11.1*  HCT 36.5*  PLT 142*   ------------------------------------------------------------------------------------------------------------------ Chemistries  Recent Labs  Lab 05/05/18 0445  05/08/18 0322  NA 141   < > 142  K 4.1   < > 4.6  CL 95*   < > 92*  CO2 43*   < > 44*  GLUCOSE 97   < > 87  BUN 16   < > 19  CREATININE 0.50*   < > 0.49*  CALCIUM 8.8*   < > 8.7*  MG 1.6*   < > 1.7  AST 25  --   --   ALT 17  --   --   ALKPHOS 67  --   --   BILITOT 0.6  --   --    < > = values in this interval not displayed.   RADIOLOGY:  No results found. ASSESSMENT AND PLAN:    78 year old male patient with history of COPD, lung cancer follows Dr. Daun Peacock as an outpatient per pulmonary comes in because of shortness of breath and also confusion found to have hypercapnia.  1 Severe acute hypoxic and hypercapnic respiratory failure  secondary to influenza A infection Patient clinically improving with , bronchodilators, treated with Tamiflu ,steroids ,on azithromycin.  Being managed by critical care physician.  Already weaned off of BiPAP on high flow oxygen.  Currently on oxygen via nasal cannula and gradually weaning down oxygen.  Oxygen requirement down to 8 L this morning. Recent ABG with elevated PCO2 with normal pH suggestive of chronic and compensated hypercapnia.  Recent chest x-ray with no acute cardiopulmonary disease , with evidence of advanced COPD and  emphysema. Case manager working on setting up trilogy for home use on discharge from the hospital Monitor clinically.   2.  Metabolic encephalopathy secondary to hypercapnia; improved .Monitor  3.  COPD exacerbation, recently treated with antibiotics with doxycycline, Solu-Medrol and now on p.o. prednisone with gradual taper., duo nebs. Critical, prognosis guarded, patient has underlying severe COPD and follows up with pulmonary. For use of trilogy on discharge from the hospital to decrease risk of mortality and recurrent admissions.  4.  Hypomagnesemia and hypophosphatemia Replaced.  5.  History of lung cancer Follow-up with his oncologist post discharge from the hospital  Disposition; physical therapist to evaluate with recommendations Palliative care team following patient.    DVT prophylaxis; Lovenox   All the records are reviewed and case discussed with Care Management/Social Worker. Management plans discussed with the patient, family and they are in agreement.  CODE STATUS: Full Code   TOTAL TIME TAKING CARE OF THIS PATIENT: 25minutes.   More than 50% of the time was spent in counseling/coordination of care: YES  POSSIBLE D/C IN 1-2 DAYS, DEPENDING ON CLINICAL CONDITION.   Annalia Metzger M.D on 05/08/2018 at 1:05 PM  Between 7am to 6pm - Pager - [phone]  After 6pm go to [email or web address] - Technical brewer [facility]  Office  [phone]  CC: Primary care physician; Leone Haven, MD  Note: This dictation was prepared with Dragon dictation along with smaller phrase technology. Any transcriptional errors that result from this process are unintentional.

## 2018-05-08 NOTE — Care Management Important Message (Signed)
Important Message  Patient Details  Name: Peter Johnston MRN: [id number] Date of Birth: 08-18-1940   Medicare Important Message Given:  Yes    Su Hilt, RN 05/08/2018, 8:18 AM

## 2018-05-09 ENCOUNTER — Inpatient Hospital Stay: Payer: Medicare Other

## 2018-05-09 DIAGNOSIS — J181 Lobar pneumonia, unspecified organism: Secondary | ICD-10-CM

## 2018-05-09 DIAGNOSIS — J9622 Acute and chronic respiratory failure with hypercapnia: Secondary | ICD-10-CM

## 2018-05-09 DIAGNOSIS — J9621 Acute and chronic respiratory failure with hypoxia: Secondary | ICD-10-CM

## 2018-05-09 DIAGNOSIS — J441 Chronic obstructive pulmonary disease with (acute) exacerbation: Secondary | ICD-10-CM

## 2018-05-09 DIAGNOSIS — Z7189 Other specified counseling: Secondary | ICD-10-CM

## 2018-05-09 LAB — BASIC METABOLIC PANEL
Anion gap: 5 (ref 5–15)
Anion gap: 7 (ref 5–15)
BUN: 22 mg/dL (ref 8–23)
BUN: 21 mg/dL (ref 8–23)
CO2: 47 mmol/L — ABNORMAL HIGH (ref 22–32)
CO2: 44 mmol/L — ABNORMAL HIGH (ref 22–32)
CREATININE: 0.6 mg/dL — AB (ref 0.61–1.24)
Calcium: 9.1 mg/dL (ref 8.9–10.3)
Calcium: 8.8 mg/dL — ABNORMAL LOW (ref 8.9–10.3)
Chloride: 90 mmol/L — ABNORMAL LOW (ref 98–111)
Chloride: 92 mmol/L — ABNORMAL LOW (ref 98–111)
Creatinine, Ser: 0.51 mg/dL — ABNORMAL LOW (ref 0.61–1.24)
GFR calc Af Amer: >60 mL/min (ref >60)
GFR calc Af Amer: >60 mL/min (ref >60)
GFR calc non Af Amer: >60 mL/min (ref >60)
GFR calc non Af Amer: >60 mL/min (ref >60)
GLUCOSE: 139 mg/dL — AB (ref 70–99)
Glucose, Bld: 130 mg/dL — ABNORMAL HIGH (ref 70–99)
Potassium: 4.4 mmol/L (ref 3.5–5.1)
Potassium: 4.6 mmol/L (ref 3.5–5.1)
Sodium: 142 mmol/L (ref 135–145)
Sodium: 143 mmol/L (ref 135–145)

## 2018-05-09 LAB — BLOOD GAS, ARTERIAL
Acid-Base Excess: 14 mmol/L — ABNORMAL HIGH (ref 0.0–2.0)
Acid-Base Excess: 22.6 mmol/L — ABNORMAL HIGH (ref 0.0–2.0)
Bicarbonate: 41.8 mmol/L — ABNORMAL HIGH (ref 20.0–28.0)
Bicarbonate: 51.4 mmol/L — ABNORMAL HIGH (ref 20.0–28.0)
FIO2: 0.64
FIO2: 0.4
O2 SAT: 96.1 %
O2 Saturation: 94.6 %
PATIENT TEMPERATURE: 37
Patient temperature: 37
pCO2 arterial: 74 mmHg (ref 32.0–48.0)
pH, Arterial: 7.41 (ref 7.350–7.450)
pH, Arterial: 7.45 (ref 7.350–7.450)
pO2, Arterial: 73 mmHg — ABNORMAL LOW (ref 83.0–108.0)
pO2, Arterial: 79 mmHg — ABNORMAL LOW (ref 83.0–108.0)

## 2018-05-09 LAB — CBC
HCT: 40.3 % (ref 39.0–52.0)
Hemoglobin: 11.9 g/dL — ABNORMAL LOW (ref 13.0–17.0)
MCH: 27.9 pg (ref 26.0–34.0)
MCHC: 29.5 g/dL — ABNORMAL LOW (ref 30.0–36.0)
MCV: 94.4 fL (ref 80.0–100.0)
NRBC: 0 % (ref 0.0–0.2)
Platelets: 191 10*3/uL (ref 150–400)
RBC: 4.27 MIL/uL (ref 4.22–5.81)
RDW: 14.5 % (ref 11.5–15.5)
WBC: 19.4 10*3/uL — ABNORMAL HIGH (ref 4.0–10.5)

## 2018-05-09 LAB — GLUCOSE, CAPILLARY: Glucose-Capillary: 128 mg/dL — ABNORMAL HIGH (ref 70–99)

## 2018-05-09 LAB — MAGNESIUM
Magnesium: 1.8 mg/dL (ref 1.7–2.4)
Magnesium: 1.7 mg/dL (ref 1.7–2.4)

## 2018-05-09 MED ORDER — MORPHINE SULFATE (PF) 2 MG/ML IV SOLN
2.0000 mg | INTRAVENOUS | Status: DC | PRN
  Administered 2018-05-10 – 2018-05-14 (×10): 2 mg via INTRAVENOUS
  Filled 2018-05-09 (×10): qty 1

## 2018-05-09 MED ORDER — METHYLPREDNISOLONE SODIUM SUCC 40 MG IJ SOLR
40.0000 mg | Freq: Two times a day (BID) | INTRAMUSCULAR | Status: DC
  Administered 2018-05-09 – 2018-05-10 (×3): 40 mg via INTRAVENOUS
  Filled 2018-05-09 (×3): qty 1

## 2018-05-09 MED ORDER — SODIUM CHLORIDE 0.9 % IV SOLN
1.0000 g | Freq: Three times a day (TID) | INTRAVENOUS | Status: DC
  Filled 2018-05-09 (×2): qty 1

## 2018-05-09 MED ORDER — IPRATROPIUM-ALBUTEROL 0.5-2.5 (3) MG/3ML IN SOLN
3.0000 mL | RESPIRATORY_TRACT | Status: DC
  Administered 2018-05-09 – 2018-05-14 (×24): 3 mL via RESPIRATORY_TRACT
  Filled 2018-05-09 (×23): qty 3

## 2018-05-09 MED ORDER — MORPHINE SULFATE (PF) 2 MG/ML IV SOLN
INTRAVENOUS | Status: AC
  Administered 2018-05-09: 2 mg via INTRAVENOUS
  Filled 2018-05-09: qty 1

## 2018-05-09 MED ORDER — PIPERACILLIN-TAZOBACTAM 3.375 G IVPB
3.3750 g | Freq: Three times a day (TID) | INTRAVENOUS | Status: DC
  Administered 2018-05-09 – 2018-05-14 (×15): 3.375 g via INTRAVENOUS
  Filled 2018-05-09 (×15): qty 50

## 2018-05-09 MED ORDER — MORPHINE SULFATE (PF) 2 MG/ML IV SOLN
2.0000 mg | Freq: Once | INTRAVENOUS | Status: AC
  Administered 2018-05-09: 2 mg via INTRAVENOUS

## 2018-05-09 NOTE — Progress Notes (Signed)
PT Cancellation Note  Patient Details Name: Peter Johnston MRN: [id number] DOB: 1941/03/10   Cancelled Treatment:    Reason Eval/Treat Not Completed: Medical issues which prohibited therapy(Per chart review, patient transferred to CCU for higher level of care.  Per guidelines, will require new PT order to resume services.  Please re-consult as medically appropriate.)   Yaneth Fairbairn H. Owens Shark, PT, DPT, NCS 05/09/18, 3:09 PM [phone]

## 2018-05-09 NOTE — Progress Notes (Signed)
Pt reading PAC, PVC and Ventricular bigeminty variations. Pt chart shows no hx of heart issues and pt verbally denies any heart hx. Pt varies from alert to lethargic. Increased O2 to 6L d/t SPO2 of 85%. O2 currently hold between 88-90%. Pt placed on continuous tele and continuous pulse ox.

## 2018-05-09 NOTE — Consult Note (Signed)
Pharmacy Antibiotic Note  Peter Johnston is a 78 y.o. male admitted on 05/15/2018 with pneumonia.  Pharmacy has been consulted for Zosyn dosing.  Plan: Zosyn 3.375g IV q8h (4 hour infusion).   Patient with listed allergy to Cefdinir but no documentation of penicillin allergy.  Height: 5\' 8"  (172.7 cm) Weight: 133 lb 2.5 oz (60.4 kg) IBW/kg (Calculated) : 68.4  Temp (24hrs), Avg:98.2 F (36.8 C), Min:97.5 F (36.4 C), Max:98.6 F (37 C)  Recent Labs  Lab 05/03/18 0540 05/05/18 0445 05/06/18 0504 05/07/18 0314 05/08/18 0322 05/09/18 1319  WBC 8.7 8.1  --   --   --  19.4*  CREATININE 0.62 0.50* 0.46* 0.51* 0.49* 0.51*    Estimated Creatinine Clearance: 64 mL/min (A) (by C-G formula based on SCr of 0.51 mg/dL (L)).    Allergies  Allergen Reactions  . Tamsulosin Shortness Of Breath and Other (See Comments)    dizziness  . Cefdinir Other (See Comments)    SOB  . Gold-Containing Drug Products Other (See Comments)    Proteinuria  . Hydrochlorothiazide Other (See Comments)    BP dropped  . Sulfa Antibiotics Nausea Only    Antimicrobials this admission: Zosyn 2/12 >>  azithro 2/4 >> 2/8 Doxy 2/3>>2/4   Dose adjustments this admission:  Microbiology results: 2/3 MRSA PCR: negative  Thank you for allowing pharmacy to be a part of this patient's care.  Forrest Moron, PharmD Clinical Pharmacist 05/09/2018 4:46 PM

## 2018-05-09 NOTE — Progress Notes (Addendum)
[facility] NAME: Peter Johnston    MR#:  [id number]  DATE OF BIRTH:  1940/08/02  SUBJECTIVE:  CHIEF COMPLAINT:   Chief Complaint  Patient presents with  . Shortness of Breath   Patient reported to be short of breath with worsening hypoxia and oxygen requirement was gradually been increased up to 10 L via high flow this morning.  Reported to have intermittent episodes of arrhythmias on telemetry including bigeminy.  Patient intermittently lethargic but easily arousable.  Stat ABG and chest x-ray done.  Patient had to be transferred back to ICU today for higher level of care.  REVIEW OF SYSTEMS:  Review of Systems  Constitutional: Negative for chills and fever.  HENT: Negative for ear pain and hearing loss.   Eyes: Negative for blurred vision and double vision.  Respiratory: Positive for shortness of breath. Negative for hemoptysis.   Cardiovascular: Negative for chest pain, palpitations and orthopnea.  Gastrointestinal: Negative for heartburn.  Genitourinary: Negative for dysuria and urgency.  Musculoskeletal: Negative for back pain and myalgias.  Skin: Negative for itching and rash.  Neurological: Negative for dizziness.  Psychiatric/Behavioral: Negative for depression and hallucinations.    DRUG ALLERGIES:   Allergies  Allergen Reactions  . Tamsulosin Shortness Of Breath and Other (See Comments)    dizziness  . Cefdinir Other (See Comments)    SOB  . Gold-Containing Drug Products Other (See Comments)    Proteinuria  . Hydrochlorothiazide Other (See Comments)    BP dropped  . Sulfa Antibiotics Nausea Only   VITALS:  Blood pressure 139/71, pulse (!) 113, temperature 98.4 F (36.9 C), temperature source Axillary, resp. rate (!) 31, height 5\' 8"  (1.727 m), weight 60.4 kg, SpO2 (!) 86 %. PHYSICAL EXAMINATION:  Physical Exam   GENERAL:  78 y.o.-year-old patient lying in the bed with no acute distress.  EYES: Pupils  equal, round, reactive to light and accommodation. No scleral icterus. Extraocular muscles intact.  HEENT: Head atraumatic, normocephalic. Oropharynx and nasopharynx clear.  NECK:  Supple, no jugular venous distention. No thyroid enlargement, no tenderness.  LUNGS: diminished air entry bilaterally. CARDIOVASCULAR: S1, S2 normal. No murmurs, rubs, or gallops.  ABDOMEN: Soft, nontender, nondistended. Bowel sounds present. No organomegaly or mass.  EXTREMITIES: No pedal edema, cyanosis, or clubbing.  Generalized weakness NEUROLOGIC: Cranial nerves II through XII are intact. Muscle strength 5/5 in all extremities. Sensation intact. Gait not checked.  PSYCHIATRIC: The patient is alert and oriented x 3.  SKIN: No obvious rash, lesion, or ulcer.   LABORATORY PANEL:  Male CBC Recent Labs  Lab 05/09/18 1319  WBC 19.4*  HGB 11.9*  HCT 40.3  PLT 191   ------------------------------------------------------------------------------------------------------------------ Chemistries  Recent Labs  Lab 05/05/18 0445  05/09/18 1319  NA 141   < > 142  K 4.1   < > 4.4  CL 95*   < > 90*  CO2 43*   < > 47*  GLUCOSE 97   < > 139*  BUN 16   < > 22  CREATININE 0.50*   < > 0.51*  CALCIUM 8.8*   < > 9.1  MG 1.6*   < > 1.8  AST 25  --   --   ALT 17  --   --   ALKPHOS 67  --   --   BILITOT 0.6  --   --    < > = values in this interval not displayed.   RADIOLOGY:  Dg Chest 1 View  Result Date: 05/09/2018 CLINICAL DATA:  COPD exacerbation with shortness of breath. EXAM: Portable CHEST 1 VIEW COMPARISON:  05/07/2018 and earlier, including CT chest 04/02/2018. FINDINGS: Cardiac silhouette mildly enlarged, unchanged. Severe bullous emphysematous changes in the UPPER lobes as noted previously. New airspace consolidation in the LEFT LOWER LOBE superimposed upon chronic scarring and interstitial lung disease. No new abnormalities in the RIGHT lung. IMPRESSION: Acute pneumonia involving the LEFT LOWER LOBE LOBE  superimposed upon chronic scarring, interstitial lung disease, and severe emphysema. Electronically Signed   By: Evangeline Dakin M.D.   On: 05/09/2018 09:11   ASSESSMENT AND PLAN:    78 year old male patient with history of COPD, lung cancer follows Dr. Daun Peacock as an outpatient per pulmonary comes in because of shortness of breath and also confusion found to have hypercapnia.  1 Severe acute hypoxic and hypercapnic respiratory failure secondary to influenza A infection Recently improved.  Was transferred out of ICU recently. Had to be transferred back to ICU today due to worsening hypoxia and requiring up to 10 L of oxygen via high flow nasal cannula this morning. Continue bronchodilators and IV steroids.  Treated with Tamiflu ,steroids .treated with antibiotics recently.   Further management by intensivist in the ICU  Repeat ABG reviewed this morning.  Repeat chest x-ray this morning with acute pneumonia involving the left lower lobe superimposed upon chronic scarring and interstitial lung disease.  Started on IV antibiotics with IV aztreonam pending further evaluation by intensivist.  Unable to do penicillins due to drug allergies Case manager working on setting up trilogy for home use on discharge from the hospital Monitor clinically.   2.  Metabolic encephalopathy secondary to hypercapnia; improved .Monitor  3.  COPD exacerbation, recently treated with antibiotics with aztreonam IV , Solu-Medrol , duo nebs. Critical, prognosis guarded, patient has underlying severe COPD and follows up with pulmonary. For use of trilogy on discharge from the hospital to decrease risk of mortality and recurrent admissions.  4.  Hypomagnesemia and hypophosphatemia Replaced.  5.  History of lung cancer Follow-up with his oncologist post discharge from the hospital  6.  Abnormal rhythms on telemetry Noted to have bigeminy's Likely related to strain on the heart from end-stage COPD. Stat  twelve-lead EKG and 2D echocardiogram requested Cardiology consult also placed.  Disposition; physical therapist to evaluate with recommendations Palliative care team following patient.    DVT prophylaxis; Lovenox  All the records are reviewed and case discussed with Care Management/Social Worker. Management plans discussed with the patient, family and they are in agreement.  CODE STATUS: DNR   TOTAL TIME TAKING CARE OF THIS PATIENT: 60minutes.   More than 50% of the time was spent in counseling/coordination of care: YES  POSSIBLE D/C IN 2 DAYS, DEPENDING ON CLINICAL CONDITION.   Anaclara Acklin M.D on 05/09/2018 at 2:06 PM  Between 7am to 6pm - Pager - [phone]  After 6pm go to [email or web address] - Technical brewer [facility]  Office  [phone]  CC: Primary care physician; Leone Haven, MD  Note: This dictation was prepared with Dragon dictation along with smaller phrase technology. Any transcriptional errors that result from this process are unintentional.

## 2018-05-09 NOTE — Progress Notes (Signed)
MEWS Guidelines - (patients age 80 and over)  Red - At High Risk for Deterioration Yellow - At risk for Deterioration  1. Go to room and assess patient 2. Validate data. Is this patient's baseline? If data confirmed: 3. Is this an acute change? 4. Administer prn meds/treatments as ordered. 5. Note Sepsis score 6. Review goals of care 7. Sports coach, RRT nurse and Provider. 8. Ask Provider to come to bedside.  9. Document patient condition/interventions/response. 10. Increase frequency of vital signs and focused assessments to at least q15 minutes x 4, then q30 minutes x2. - If stable, then q1h x3, then q4h x3 and then q8h or dept. routine. - If unstable, contact Provider & RRT nurse. Prepare for possible transfer. 11. Add entry in progress notes using the smart phrase ".MEWS". 1. Go to room and assess patient 2. Validate data. Is this patient's baseline? If data confirmed: 3. Is this an acute change? 4. Administer prn meds/treatments as ordered? 5. Note Sepsis score 6. Review goals of care 7. Sports coach and Provider 8. Call RRT nurse as needed. 9. Document patient condition/interventions/response. 10. Increase frequency of vital signs and focused assessments to at least q2h x2. - If stable, then q4h x2 and then q8h or dept. routine. - If unstable, contact Provider & RRT nurse. Prepare for possible transfer. 11. Add entry in progress notes using the smart phrase ".MEWS".  Green - Likely stable Lavender - Comfort Care Only  1. Continue routine/ordered monitoring.  2. Review goals of care. 1. Continue routine/ordered monitoring. 2. Review goals of care.    Mews score of 4. RRT notified. MD notified. Continuing to monitor. Pt assessment consistent with with presentation at bedside report and with presentation throughout the last hour. Pt does not meet criterion for PRN medications at this time. Sepsis score of 4. Mews is now score of  3 for at risk for  deterioration.

## 2018-05-09 NOTE — Progress Notes (Signed)
Follow up - Critical Care Medicine Note  Patient Details:    Peter Johnston is an 78 y.o. male. This is 78 year old former smoker with a history of severe stage IV COPD (FEV1 0.86 L or 29% of predicted in 2013) followed by Dr. Nathanial Rancher for his COPD issues.  He presented on this 3 February [facility] with exacerbation of COPD due to influenza A.  He has completed Tamiflu therapy.  He required BiPAP early on in his hospitalization.  He was transferred out of the ICU on 06 May 2018.  He returns ICU today with increasing dyspnea and O2 requirements.  He is transferred as at that time he was still full code.  Please see Dr.Aleskerov's notes from his ICU stay.  The patient is currently being followed by palliative care.  However formal CODE STATUS had not been established.  Lines, Airways, Drains:    Anti-infectives:  Anti-infectives (From admission, onward)   Start     Dose/Rate Route Frequency Ordered Stop   05/09/18 1415  aztreonam (AZACTAM) 1 g in sodium chloride 0.9 % 100 mL IVPB     1 g 200 mL/hr over 30 Minutes Intravenous Every 8 hours 05/09/18 1412     05/01/18 1115  azithromycin (ZITHROMAX) 500 mg in sodium chloride 0.9 % 250 mL IVPB     500 mg 250 mL/hr over 60 Minutes Intravenous Daily 05/01/18 1106 05/05/18 1140   05/05/2018 2200  doxycycline (VIBRA-TABS) tablet 100 mg  Status:  Discontinued     100 mg Oral Every 12 hours 05/15/2018 1403 05/01/18 1106   05/08/2018 1500  oseltamivir (TAMIFLU) capsule 75 mg     75 mg Oral 2 times daily 04/28/2018 1434 05/05/18 0959   05/17/2018 1300  doxycycline (VIBRAMYCIN) 100 mg in sodium chloride 0.9 % 250 mL IVPB     100 mg 125 mL/hr over 120 Minutes Intravenous  Once 05/05/2018 1251 05/18/2018 1612      Microbiology: Results for orders placed or performed during the hospital encounter of 05/04/2018  MRSA PCR Screening     Status: None   Collection Time: 04/29/2018  3:26 PM  Result Value Ref Range Status   MRSA by PCR NEGATIVE NEGATIVE Final   Comment:        The GeneXpert MRSA Assay (FDA approved for NASAL specimens only), is one component of a comprehensive MRSA colonization surveillance program. It is not intended to diagnose MRSA infection nor to guide or monitor treatment for MRSA infections. Performed at [facility], [address] [place], [place] [address]     Best Practice/Protocols:  VTE Prophylaxis: Lovenox (prophylaxtic dose) Comfort care  Events: Transferred to ICU because of increasing respiratory distress and lethargy.  Studies: Dg Chest 1 View  Result Date: 05/09/2018 CLINICAL DATA:  COPD exacerbation with shortness of breath. EXAM: Portable CHEST 1 VIEW COMPARISON:  05/07/2018 and earlier, including CT chest 04/02/2018. FINDINGS: Cardiac silhouette mildly enlarged, unchanged. Severe bullous emphysematous changes in the UPPER lobes as noted previously. New airspace consolidation in the LEFT LOWER LOBE superimposed upon chronic scarring and interstitial lung disease. No new abnormalities in the RIGHT lung. IMPRESSION: Acute pneumonia involving the LEFT LOWER LOBE LOBE superimposed upon chronic scarring, interstitial lung disease, and severe emphysema. Electronically Signed   By: Evangeline Dakin M.D.   On: 05/09/2018 09:11   Dg Chest 1 View  Result Date: 05/07/2018 CLINICAL DATA:  Short of breath.  History of COPD.  Former smoker. EXAM: CHEST  1 VIEW COMPARISON:  05/12/2018 and older exams. FINDINGS: Mild enlargement of the cardiopericardial silhouette, stable. No mediastinal or hilar masses. Lungs are hyperexpanded. Extensive emphysema with a marked paucity of markings in the upper lungs. There is lower lung zone bronchovascular crowding and chronic scarring. No evidence of pneumonia or pulmonary edema. No pleural effusion or pneumothorax. Skeletal structures are grossly intact. IMPRESSION: 1. No acute cardiopulmonary disease. 2. Advanced COPD/emphysema with areas of lower lung zone chronic scarring.  Electronically Signed   By: Lajean Manes M.D.   On: 05/07/2018 10:30   Dg Chest 2 View  Result Date: 05/25/2018 CLINICAL DATA:  Shortness of breath.  Productive cough. EXAM: CHEST - 2 VIEW COMPARISON:  Chest CT dated 04/02/2018 and chest x-ray dated 10/16/2015 FINDINGS: Heart size is within normal limits considering the AP technique. Aortic atherosclerosis. Severe emphysema. There is chronic marked accentuation of the interstitial markings in the left midzone and at low both lung bases, slightly progressed since 2017 but essentially unchanged since the recent CT scan. No acute bone abnormality. Chronic blunting of the posterior costophrenic angles. IMPRESSION: Severe chronic emphysema and interstitial lung disease, essentially unchanged since the CT scan of 04/02/2018. Aortic Atherosclerosis (ICD10-I70.0) and Emphysema (ICD10-J43.9). Electronically Signed   By: Lorriane Shire M.D.   On: 05/05/2018 12:03    Consults: Treatment Team:  Isaias Cowman, MD   Subjective:    Overnight Issues:  Patient had increasing oxygen requirements, he has increased work of breathing on arrival to the ICU.  Oral mucosa very dry.  Mentation waxes and wanes however he is currently answering questions appropriately.  Son and sister at the bedside. Patient had not been receiving nebulization treatments on the ward.  He was on [employer]. Objective:  Vital signs for last 24 hours: Temp:  [97.5 F (36.4 C)-98.6 F (37 C)] 98.4 F (36.9 C) (02/12 1304) Pulse Rate:  [48-114] 113 (02/12 1304) Resp:  [18-31] 31 (02/12 1304) BP: (123-152)/(68-87) 139/71 (02/12 1304) SpO2:  [73 %-96 %] 86 % (02/12 1304) Weight:  [60.4 kg-61.7 kg] 60.4 kg (02/12 1304)  Hemodynamic parameters for last 24 hours:    Intake/Output from previous day: No intake/output data recorded.  Intake/Output this shift: No intake/output data recorded.  Vent settings for last 24 hours:    Physical Exam:  General: Acute on chronically  ill-appearing man lethargic, with increased work of breathing and use of accessories. HEENT: Edentulous, oral mucosa dry.  No thrush. Neck: Supple no JVD noted.  Trachea midline.  No crepitus. Lungs: Distant breath sounds, coarse, no wheezes noted.  Poor air movement. Cardiac: Regular rate and rhythm with frequent extrasystoles. Abdomen: Soft, nondistended, normoactive bowel sounds. Extremities:Sarcopenia noted. Neurologic: Lethargic but arousable.  Answers questions appropriately.    Assessment/Plan:   1.  Acute on chronic mixed respiratory failure due to decompensation of COPD.  Initial event was due to influenza A.  Has completed Tamiflu.  He was transferred to the floor and has not had adequate bronchodilator therapy.  This has been initiated in the form of DuoNeb.  Switch p.o. steroids to IV.  He also has a left lower lobe pneumonia which may be aggravating the issue this is superimposed on severe COPD and chronic interstitial changes.  We will proceed to start Zosyn.  High flow O2 with maintenance of saturations between 88 to 92%..  Conservatively with aimed towards comfort care.  2.  COPD stage IV by GOLD criteria (very severe COPD): The patient has very advanced COPD.  We will change steroids to  IV steroids for now.  Start DuoNebs every 4 hours while awake.  Patient will be treated conservatively with aimed more towards comfort measures.  I had a discussion with the patient's son and the patient's sister and they understand that his prognosis is very poor and a placing him on mechanical ventilation is not going to be neither curative nor palliative.  They are in agreement towards eating him conservatively with goal at transitioning to full comfort measures if he does not respond to conservative management.  We will add morphine as needed for her hunger and dyspnea.  3.  Left lower lobe pneumonia after influenza: Zosyn per pharmacy.  4.  Recent influenza A: Finished Tamiflu.  5.  Frequent  ectopy: Check electrolytes and replete as needed.  6.  End-of-life discussion: Patient is DNR/DNI.  Discussed with patient's son and patient's sister at bedside.      LOS: 9 days   Additional comments:  Critical Care Total Time*: 45 minutes   C.Derrill Kay, MD [facility]  05/09/2018  *Care during the described time interval was provided by me and/or other providers on the critical care team.  I have reviewed this patient's available data, including medical history, events of note, physical examination and test results as part of my evaluation.

## 2018-05-09 NOTE — Progress Notes (Signed)
PT Cancellation Note  Patient Details Name: Peter Johnston MRN: [id number] DOB: [birth year]   Cancelled Treatment:    Reason Eval/Treat Not Completed: Medical issues which prohibited therapy(Evaluation re-attempted.  Patient remains significantly SOB, with recent increase in O2 requirements (from 8 to 10L HFNC); unable to tolerate exertional activity at this time.  Will continue hold and initiate as appropriate.)   Dharma Pare H. Owens Shark, PT, DPT, NCS 05/09/18, 9:23 AM [phone]

## 2018-05-10 ENCOUNTER — Inpatient Hospital Stay (HOSPITAL_COMMUNITY)
Admit: 2018-05-10 | Discharge: 2018-05-10 | Disposition: A | Discharge status: Home / Self Care | Payer: Medicare Other | Attending: Physician Assistant | Admitting: Physician Assistant

## 2018-05-10 DIAGNOSIS — I340 Nonrheumatic mitral (valve) insufficiency: Secondary | ICD-10-CM

## 2018-05-10 DIAGNOSIS — I471 Supraventricular tachycardia: Secondary | ICD-10-CM

## 2018-05-10 DIAGNOSIS — I420 Dilated cardiomyopathy: Secondary | ICD-10-CM

## 2018-05-10 LAB — ECHOCARDIOGRAM COMPLETE
Height: 68 in
Weight: 2130.53 oz

## 2018-05-10 MED ORDER — MAGNESIUM SULFATE 2 GM/50ML IV SOLN
2.0000 g | Freq: Once | INTRAVENOUS | Status: AC
  Administered 2018-05-10: 2 g via INTRAVENOUS
  Filled 2018-05-10 (×2): qty 50

## 2018-05-10 MED ORDER — SENNOSIDES-DOCUSATE SODIUM 8.6-50 MG PO TABS
2.0000 | ORAL_TABLET | Freq: Two times a day (BID) | ORAL | Status: DC
  Administered 2018-05-10 – 2018-05-14 (×8): 2 via ORAL
  Filled 2018-05-10 (×8): qty 2

## 2018-05-10 MED ORDER — METHYLPREDNISOLONE SODIUM SUCC 40 MG IJ SOLR
40.0000 mg | Freq: Two times a day (BID) | INTRAMUSCULAR | Status: DC
  Administered 2018-05-10 – 2018-05-14 (×8): 40 mg via INTRAVENOUS
  Filled 2018-05-10 (×8): qty 1

## 2018-05-10 MED ORDER — FUROSEMIDE 10 MG/ML IJ SOLN
40.0000 mg | Freq: Two times a day (BID) | INTRAMUSCULAR | Status: DC
  Administered 2018-05-10 – 2018-05-12 (×5): 40 mg via INTRAVENOUS
  Filled 2018-05-10 (×5): qty 4

## 2018-05-10 MED ORDER — BISACODYL 10 MG RE SUPP: 10.0000 mg | Freq: Every day | RECTAL | Status: DC | PRN

## 2018-05-10 MED ORDER — POLYVINYL ALCOHOL 1.4 % OP SOLN
1.0000 [drp] | OPHTHALMIC | Status: DC | PRN
  Filled 2018-05-10: qty 15

## 2018-05-10 MED ORDER — MAGNESIUM OXIDE 400 (241.3 MG) MG PO TABS: 800.0000 mg | ORAL_TABLET | Freq: Two times a day (BID) | ORAL | Status: DC

## 2018-05-10 NOTE — Progress Notes (Signed)
Palliative: Peter Johnston is resting quietly in bed.  He appears quite frail and ill.  He is unable to speak more than 1 word at a time without struggling to breathe.  Present today at bedside his son Peter Johnston and Sister June.  We talked in detail about his frailty, poor functional status.  We talked about his vital signs and my concern about his oxygenation.  We talked about high flow oxygen at 85%.  I asked Peter Johnston if he would rather be more asleep and comfortable or more awake and less comfortable.  He states clearly in front of family that he would prefer to be more asleep but comfortable.  During our conversation, son Peter Johnston is unable to make eye contact or acknowledge the severity of his father's illness.  He shares that he feels that his father looks improved today.  We talked about how to make choices for loved ones including 1) keeping them at the center of decision-making 2) are we doing something FOR him, or TO him (can we change what is happening).  We talked about Peter Johnston end stage lung disease and history of lung cancer, things that we cannot change.  Talked about the concept of comfort care.  Peter Johnston states that his father really wants to see his significant other of 10 years, Peter Johnston who lives in their home.  I ask if Peter Johnston could come today, but Peter Johnston states that she could not, "she has been sick".  He states that he will bring her first thing in the morning.  We plan for family meeting 2/14 at 0930.  Conference with nursing staff related to goals of care discussion. Conference with CCM physician related to goals of care discussion.  65 minutes, extended time Peter Axe, NP  Palliative Medicine Team  Team Phone # [phone]  Greater than 50% of this time was spent counseling and coordinating care related to the above assessment and plan.

## 2018-05-10 NOTE — Consult Note (Signed)
Pharmacy Antibiotic Note  Peter Johnston is a 78 y.o. male admitted on 05/15/2018 with pneumonia.  Pharmacy has been consulted for Zosyn dosing.  Plan: Continue Zosyn 3.375g IV q8h (4 hour infusion).   Patient with listed allergy to Cefdinir but no documentation of penicillin allergy.  Height: 5\' 8"  (172.7 cm) Weight: 133 lb 2.5 oz (60.4 kg) IBW/kg (Calculated) : 68.4  Temp (24hrs), Avg:98.2 F (36.8 C), Min:97.9 F (36.6 C), Max:98.6 F (37 C)  Recent Labs  Lab 05/05/18 0445 05/06/18 0504 05/07/18 0314 05/08/18 0322 05/09/18 1319 05/09/18 2030  WBC 8.1  --   --   --  19.4*  --   CREATININE 0.50* 0.46* 0.51* 0.49* 0.51* 0.60*    Estimated Creatinine Clearance: 64 mL/min (A) (by C-G formula based on SCr of 0.6 mg/dL (L)).    Allergies  Allergen Reactions  . Tamsulosin Shortness Of Breath and Other (See Comments)    dizziness  . Cefdinir Other (See Comments)    SOB  . Gold-Containing Drug Products Other (See Comments)    Proteinuria  . Hydrochlorothiazide Other (See Comments)    BP dropped  . Sulfa Antibiotics Nausea Only    Antimicrobials this admission: Zosyn 2/12 >>  azithro 2/4 >> 2/8 Doxy 2/3>>2/4   Dose adjustments this admission:  Microbiology results: 2/3 MRSA PCR: negative  Thank you for allowing pharmacy to be a part of this patient's care.  Pernell Dupre, PharmD, BCPS Clinical Pharmacist 05/10/2018 10:05 AM

## 2018-05-10 NOTE — Progress Notes (Signed)
*  PRELIMINARY RESULTS* Echocardiogram 2D Echocardiogram has been performed.  Peter Johnston 05/10/2018, 10:28 AM

## 2018-05-10 NOTE — Progress Notes (Signed)
Follow up - Critical Care Medicine Note  Patient Details:    Peter Johnston is an 78 y.o. male. This is 78 year old former smoker with a history of severe stage IV COPD (FEV1 0.86 L or 29% of predicted in 2013) followed by Dr. Nathanial Rancher for his COPD issues.  He presented on this 3 February to [facility] with exacerbation of COPD due to influenza A.  He has completed Tamiflu therapy.  He required BiPAP early on in his hospitalization.  He was transferred out of the ICU on 06 May 2018.  He returns ICU today with increasing dyspnea and O2 requirements.  He is DNR/DNI status.  Lines, Airways, Drains:    Anti-infectives:  Anti-infectives (From admission, onward)   Start     Dose/Rate Route Frequency Ordered Stop   05/09/18 1700  piperacillin-tazobactam (ZOSYN) IVPB 3.375 g     3.375 g 12.5 mL/hr over 240 Minutes Intravenous Every 8 hours 05/09/18 1647     05/09/18 1415  aztreonam (AZACTAM) 1 g in sodium chloride 0.9 % 100 mL IVPB  Status:  Discontinued     1 g 200 mL/hr over 30 Minutes Intravenous Every 8 hours 05/09/18 1412 05/09/18 1450   05/01/18 1115  azithromycin (ZITHROMAX) 500 mg in sodium chloride 0.9 % 250 mL IVPB     500 mg 250 mL/hr over 60 Minutes Intravenous Daily 05/01/18 1106 05/05/18 1140   05/17/2018 2200  doxycycline (VIBRA-TABS) tablet 100 mg  Status:  Discontinued     100 mg Oral Every 12 hours 05/19/2018 1403 05/01/18 1106   05/24/2018 1500  oseltamivir (TAMIFLU) capsule 75 mg     75 mg Oral 2 times daily 05/04/2018 1434 05/05/18 0959   05/19/2018 1300  doxycycline (VIBRAMYCIN) 100 mg in sodium chloride 0.9 % 250 mL IVPB     100 mg 125 mL/hr over 120 Minutes Intravenous  Once 05/09/2018 1251 05/22/2018 1612      Microbiology: Results for orders placed or performed during the hospital encounter of 05/08/2018  MRSA PCR Screening     Status: None   Collection Time: 05/06/2018  3:26 PM  Result Value Ref Range Status   MRSA by PCR NEGATIVE NEGATIVE Final    Comment:        The  GeneXpert MRSA Assay (FDA approved for NASAL specimens only), is one component of a comprehensive MRSA colonization surveillance program. It is not intended to diagnose MRSA infection nor to guide or monitor treatment for MRSA infections. Performed at [facility], [address] [place], [place] [address]     Best Practice/Protocols:  VTE Prophylaxis: Lovenox (prophylaxtic dose) Comfort care  Events: Transferred to ICU because of increasing respiratory distress and lethargy.  Studies: Dg Chest 1 View  Result Date: 05/09/2018 CLINICAL DATA:  COPD exacerbation with shortness of breath. EXAM: Portable CHEST 1 VIEW COMPARISON:  05/07/2018 and earlier, including CT chest 04/02/2018. FINDINGS: Cardiac silhouette mildly enlarged, unchanged. Severe bullous emphysematous changes in the UPPER lobes as noted previously. New airspace consolidation in the LEFT LOWER LOBE superimposed upon chronic scarring and interstitial lung disease. No new abnormalities in the RIGHT lung. IMPRESSION: Acute pneumonia involving the LEFT LOWER LOBE LOBE superimposed upon chronic scarring, interstitial lung disease, and severe emphysema. Electronically Signed   By: Evangeline Dakin M.D.   On: 05/09/2018 09:11   Dg Chest 1 View  Result Date: 05/07/2018 CLINICAL DATA:  Short of breath.  History of COPD.  Former smoker. EXAM: CHEST  1 VIEW COMPARISON:  05/20/2018 and older exams. FINDINGS: Mild enlargement of the cardiopericardial silhouette, stable. No mediastinal or hilar masses. Lungs are hyperexpanded. Extensive emphysema with a marked paucity of markings in the upper lungs. There is lower lung zone bronchovascular crowding and chronic scarring. No evidence of pneumonia or pulmonary edema. No pleural effusion or pneumothorax. Skeletal structures are grossly intact. IMPRESSION: 1. No acute cardiopulmonary disease. 2. Advanced COPD/emphysema with areas of lower lung zone chronic scarring. Electronically  Signed   By: Lajean Manes M.D.   On: 05/07/2018 10:30   Dg Chest 2 View  Result Date: 05/02/2018 CLINICAL DATA:  Shortness of breath.  Productive cough. EXAM: CHEST - 2 VIEW COMPARISON:  Chest CT dated 04/02/2018 and chest x-ray dated 10/16/2015 FINDINGS: Heart size is within normal limits considering the AP technique. Aortic atherosclerosis. Severe emphysema. There is chronic marked accentuation of the interstitial markings in the left midzone and at low both lung bases, slightly progressed since 2017 but essentially unchanged since the recent CT scan. No acute bone abnormality. Chronic blunting of the posterior costophrenic angles. IMPRESSION: Severe chronic emphysema and interstitial lung disease, essentially unchanged since the CT scan of 04/02/2018. Aortic Atherosclerosis (ICD10-I70.0) and Emphysema (ICD10-J43.9). Electronically Signed   By: Lorriane Shire M.D.   On: 05/01/2018 12:03    Consults: Treatment Team:  Minna Merritts, MD [facility], [facility], MD   Subjective:    Overnight Issues: Overnight he had some issues as morphine was not being given with the mistaken impression that it was written for pain.  Order is clearly written for dyspnea or discomfort.  This morning he received morphine for respiratory distress and he now looks markedly better.  He does not voice any complaints this morning.  He is a little sleepy after morphine dosing.  Currently looks comfortable. Objective:  Vital signs for last 24 hours: Temp:  [97.9 F (36.6 C)-98.2 F (36.8 C)] 97.9 F (36.6 C) (02/13 1933) Pulse Rate:  [45-124] 53 (02/13 2000) Resp:  [21-35] 21 (02/13 2000) BP: (108-142)/(59-101) 132/79 (02/13 2000) SpO2:  [84 %-99 %] 91 % (02/13 2000) FiO2 (%):  [40 %-80 %] 80 % (02/13 1932)  Hemodynamic parameters for last 24 hours:    Intake/Output from previous day: 02/12 0701 - 02/13 0700 In: 310 [P.O.:210; IV Piggyback:100] Out: 150 [Urine:150]  Intake/Output this shift: No intake/output  data recorded.  Vent settings for last 24 hours: FiO2 (%):  [40 %-80 %] 80 %  Physical Exam:  General: Acute on chronically ill-appearing sleepy but easily awakened.  Chronic use of accessories  HEENT: Edentulous, oral mucosa dry.  No thrush. Neck: Supple no JVD noted.  Trachea midline.  No crepitus. Lungs: Distant breath sounds, coarse, no wheezes noted.  Poor air movement. Cardiac: Regular rate and rhythm with frequent extrasystoles. Abdomen: Soft,protruberant, nondistended, normoactive bowel sounds. Extremities:Sarcopenia noted. Neurologic: Lethargic but arousable.  Answers questions appropriately. Skin: Multiple ecchymotic areas.  Very thin skin.    Assessment/Plan:   1.  Acute on chronic mixed respiratory failure due to decompensation of COPD.  Initial event was due to influenza A.  Has completed Tamiflu.  He was transferred to the floor and has not had adequate bronchodilator therapy.  This has been initiated in the form of DuoNeb. On IV steroids.  He also has a left lower lobe pneumonia which may be aggravating the issue this is superimposed on severe COPD and chronic interstitial changes.  Continue Zosyn.  High flow O2 with maintenance of saturations between 88 to 92%. Treat  conservatively with aim towards comfort care.  2.  COPD stage IV by GOLD criteria (very severe COPD): The patient has very advanced COPD.  Continue IV steroids for now.  Start DuoNebs every 4 hours while awake.  Patient will be treated conservatively with aim more towards comfort measures.  I had a discussion with the patient's son and the patient's sister and they understand that his prognosis is very poor and a placing him on mechanical ventilation is not going to be curative nor palliative.  They are in agreement towards treating him conservatively with goal at transitioning to full comfort measures if he does not respond to conservative management.  Continue morphine as needed for her hunger and dyspnea.  3.   Left lower lobe pneumonia after influenza: Zosyn per pharmacy.  4.  Recent influenza A: Finished Tamiflu.  5.  Frequent ectopy: This has resolved with management of respiratory distress.    6.  End-of-life discussion: Patient is DNR/DNI.  This was reiterated today with the son and the patient's sister.     LOS: 10 days   Additional comments:  Total visit time: 35 minutes   C.Derrill Kay, MD [facility]  05/10/2018  *Care during the described time interval was provided by me and/or other providers on the critical care team.  I have reviewed this patient's available data, including medical history, events of note, physical examination and test results as part of my evaluation.

## 2018-05-10 NOTE — Progress Notes (Signed)
[facility] [place] NAME: Peter Johnston    MR#:  [id number]  DATE OF BIRTH:  Jul 13, 1940  SUBJECTIVE:  CHIEF COMPLAINT:   Chief Complaint  Patient presents with  . Shortness of Breath   Patient was transferred back to the ICU on 05/09/2018.  CODE STATUS readdressed with patient and family by intensivist.  Patient made DNR.  Managing conservatively with ultimate plan of transitioning to comfort care measures.  Palliative care team following patient.  REVIEW OF SYSTEMS:  Review of Systems  Constitutional: Negative for chills and fever.  HENT: Negative for ear pain and hearing loss.   Eyes: Negative for blurred vision and double vision.  Respiratory: Positive for shortness of breath. Negative for hemoptysis.   Cardiovascular: Negative for chest pain, palpitations and orthopnea.  Gastrointestinal: Negative for heartburn.  Genitourinary: Negative for dysuria and urgency.  Musculoskeletal: Negative for back pain and myalgias.  Skin: Negative for itching and rash.  Neurological: Negative for dizziness.  Psychiatric/Behavioral: Negative for depression and hallucinations.    DRUG ALLERGIES:   Allergies  Allergen Reactions  . Tamsulosin Shortness Of Breath and Other (See Comments)    dizziness  . Cefdinir Other (See Comments)    SOB  . Gold-Containing Drug Products Other (See Comments)    Proteinuria  . Hydrochlorothiazide Other (See Comments)    BP dropped  . Sulfa Antibiotics Nausea Only   VITALS:  Blood pressure (!) 129/94, pulse 76, temperature 97.9 F (36.6 C), temperature source Axillary, resp. rate (!) 24, height 5\' 8"  (1.727 m), weight 60.4 kg, SpO2 96 %. PHYSICAL EXAMINATION:  Physical Exam   GENERAL:  78 y.o.-year-old patient lying in the bed with no acute distress.  EYES: Pupils equal, round, reactive to light and accommodation. No scleral icterus. Extraocular muscles intact.  HEENT: Head atraumatic, normocephalic.  Oropharynx and nasopharynx clear.  NECK:  Supple, no jugular venous distention. No thyroid enlargement, no tenderness.  LUNGS: diminished air entry bilaterally. CARDIOVASCULAR: S1, S2 normal. No murmurs, rubs, or gallops.  ABDOMEN: Soft, nontender, nondistended. Bowel sounds present. No organomegaly or mass.  EXTREMITIES: No pedal edema, cyanosis, or clubbing.  Generalized weakness NEUROLOGIC: Cranial nerves II through XII are intact. Muscle strength 5/5 in all extremities. Sensation intact. Gait not checked.  PSYCHIATRIC: The patient is alert and oriented x 3.  SKIN: No obvious rash, lesion, or ulcer.   LABORATORY PANEL:  Male CBC Recent Labs  Lab 05/09/18 1319  WBC 19.4*  HGB 11.9*  HCT 40.3  PLT 191   ------------------------------------------------------------------------------------------------------------------ Chemistries  Recent Labs  Lab 05/05/18 0445  05/09/18 2030  NA 141   < > 143  K 4.1   < > 4.6  CL 95*   < > 92*  CO2 43*   < > 44*  GLUCOSE 97   < > 130*  BUN 16   < > 21  CREATININE 0.50*   < > 0.60*  CALCIUM 8.8*   < > 8.8*  MG 1.6*   < > 1.7  AST 25  --   --   ALT 17  --   --   ALKPHOS 67  --   --   BILITOT 0.6  --   --    < > = values in this interval not displayed.   RADIOLOGY:  No results found. ASSESSMENT AND PLAN:    78 year old male patient with history of COPD, lung cancer follows Dr. Daun Peacock as an outpatient per  pulmonary comes in because of shortness of breath and also confusion found to have hypercapnia.  1 Severe acute hypoxic and hypercapnic respiratory failure secondary to influenza A infection Recently improved.  Was transferred out of ICU recently. Had to be transferred back to ICU on 05/09/2018 due to worsening hypoxia and requiring up to 10 L of oxygen via high flow nasal cannula this morning. Continue bronchodilators and IV steroids.  Treated with Tamiflu ,steroids .treated with antibiotics recently.   Further management by  intensivist in the ICU  Repeat ABG reviewed recently.  Repeat chest x-ray recently with acute pneumonia involving the left lower lobe superimposed upon chronic scarring and interstitial lung disease.  Started on IV antibiotics with IV Zosyn and tolerating well.   Case manager was working on setting up trilogy for home use on discharge from the hospital Patient being managed conservatively at this time with plans to ultimately transition to comfort care.  Currently DNR.  Palliative care team following with meeting scheduled tomorrow to readdress goals of care.  2.  Metabolic encephalopathy secondary to hypercapnia; improved .Monitor  3.  COPD exacerbation, recently treated with antibiotics with aztreonam IV , Solu-Medrol , duo nebs. Critical, prognosis guarded, patient has underlying severe COPD and follows up with pulmonary. For use of trilogy on discharge from the hospital to decrease risk of mortality and recurrent admissions.  4.  Hypomagnesemia and hypophosphatemia Replaced.  5.  History of lung cancer Follow-up with his oncologist post discharge from the hospital  6.  Abnormal rhythms on telemetry Noted to have bigeminy's Likely related to strain on the heart from end-stage COPD. 2D echocardiogram done with ejection fraction of 30 to 35%.  Unable to exclude regional wall motion abnormalities. Cardiology consult also placed.  Disposition; physical therapist to evaluate with recommendations Palliative care team following patient.    DVT prophylaxis; Lovenox  All the records are reviewed and case discussed with Care Management/Social Worker. Management plans discussed with the patient, family and they are in agreement.  CODE STATUS: DNR   TOTAL TIME TAKING CARE OF THIS PATIENT: 40minutes.   More than 50% of the time was spent in counseling/coordination of care: YES  POSSIBLE D/C IN 2 DAYS, DEPENDING ON CLINICAL CONDITION.   Virginia Francisco M.D on 05/10/2018 at 2:58 PM  Between  7am to 6pm - Pager - [phone]  After 6pm go to [email or web address] - [employer] [facility]  Office  [phone]  CC: Primary care physician; Leone Haven, MD  Note: This dictation was prepared with Dragon dictation along with smaller phrase technology. Any transcriptional errors that result from this process are unintentional.

## 2018-05-10 NOTE — Consult Note (Addendum)
Cardiology Consultation:   Patient ID: DEZMEN ALCOCK MRN: [id number]; DOB: Aug 17, 1940  Admit date: 05/12/2018 Date of Consult: 05/10/2018  Primary Care Provider: Leone Haven, MD Primary Cardiologist: Iverson Alamin Physician requesting consult: Dr. Stark Jock. Reason for consult: Arrhythmia, bigeminy, shortness of breath   Patient Profile:   Peter Johnston is a 78 y.o. male with a hx of newly diagnosed HFrEF (EF 30-35%, TTE 05/10/2018), pulmonary HTN (59.46mmHg, TTE 04/2018), aortic atherosclerosis, CAD (per CT 1/6),  hypertension, acute on chronic severe stage IV COPD in the setting of influenza A, chronic respiratory failure on home oxygen, former smoker, past lung CA of left upper lobe s/p radiation therapy, rheumatoid arthritis, and iron deficiency anemia who is being seen today for the evaluation of bigeminy /PVCs at the request of Dr. Stark Jock.  History of Present Illness:   Mr. Sebald is a 78 year old male with PMH as above and no known cardiac history prior to admission.  Per review of EMR, previous CT 04/02/2018 imaging significant for aortic atherosclerosis, coronary artery atherosclerosis, and pulmonary artery enlargement suggesting pulmonary arterial hypertension.  CT findings specifically noted advanced aortic and branch vessel atherosclerosis, borderline cardiomegaly without pericardial effusion, multivessel coronary artery atherosclerosis, pulmonary artery enlargement, and outflow tract 3.4 cm.  He was admitted on 04/29/2018 with severe acute hypoxic and hypercapnic respiratory failure in the setting of influenza A with acute on chronic severe COPD. HPI obtained from review of EMR, which was obtained from family as patient was unable to give history due to acute illness.  Per patient's sister, the patient had apparently been with the son he would the flu the previous weekend/ last week of January.  The patient then started to feel symptoms similar to that of his son including shortness of  breath, DOE, and increased phlegm production.  EMS was called due to severe respiratory failure.    In the ED, patient was noted to be positive for influenza A.  He was started on Tamiflu, BiPAP, antibiotics, and nebulizer treatment.  Chest x-ray at that time showed severe chronic emphysema and interstitial lung disease, essentially unchanged since CT scan 04/02/2018.  Aortic atherosclerosis and emphysema were noted.  He was admitted for further management to the ICU.   Repeat 2/12 Chest x-ray showed acute pneumonia involving the left lower lobe superimposed upon chronic scarring, interstitial lung disease, and severe emphysema.   Past Medical History:  Diagnosis Date  . Arthritis   . Asthma   . COPD (chronic obstructive pulmonary disease) ([place])   . Hypertension   . IDA (iron deficiency anemia)   . Lung mass     Past Surgical History:  Procedure Laterality Date  . ABDOMINAL HERNIA REPAIR  1995   Dr. Rochel Brome  . HAMMER TOE SURGERY  2011, 2012   x 2- Dr. Elvina Mattes     Home Medications:  Prior to Admission medications   Medication Sig Start Date End Date Taking? Authorizing Provider  acetaminophen (TYLENOL) 500 MG tablet Take 500 mg by mouth daily.    Yes [provider]  allopurinol (ZYLOPRIM) 300 MG tablet Take 300 mg by mouth daily.   Yes [provider]  atenolol (TENORMIN) 25 MG tablet TAKE 1 TABLET DAILY Patient taking differently: Take 12.5 mg by mouth daily.  01/09/17  Yes Leone Haven, MD  bimatoprost (LUMIGAN) 0.01 % SOLN Place 1 drop into both eyes 2 (two) times daily.   Yes [provider]  esomeprazole (NEXIUM) 20 MG capsule Take 1 capsule (  20 mg total) by mouth daily before breakfast. 02/07/18  Yes Leone Haven, MD  hydroxychloroquine (PLAQUENIL) 200 MG tablet Take 200 mg by mouth daily.  01/19/18 01/19/19 Yes [provider]  ipratropium-albuterol (DUONEB) 0.5-2.5 (3) MG/3ML SOLN Inhale 3 mLs into the lungs every 4 (four)  hours as needed. Patient taking differently: Inhale 3 mLs into the lungs every 4 (four) hours as needed (for wheezing/shortness of breath).  11/30/16  Yes Cook, Jayce G, DO  predniSONE (DELTASONE) 10 MG tablet TAKE 1 TABLET BY MOUTH  DAILY WITH BREAKFAST Patient taking differently: Take 10 mg by mouth daily.  01/01/18  Yes Allyne Gee, MD  theophylline (UNIPHYL) 400 MG 24 hr tablet TAKE 1 TABLET BY MOUTH  DAILY 01/29/18  Yes Allyne Gee, MD  traMADol (ULTRAM) 50 MG tablet Take 50 mg by mouth daily. 06/15/17  Yes [provider]  Travoprost, BAK Free, (TRAVATAN) 0.004 % SOLN ophthalmic solution Place 1 drop into both eyes at bedtime.   Yes [provider]  triamcinolone cream (KENALOG) 0.5 % Apply 1 application topically 2 (two) times daily. 01/15/18  Yes [provider]  umeclidinium-vilanterol (ANORO ELLIPTA) 62.5-25 MCG/INH AEPB Inhale 1 puff into the lungs daily. 01/30/18  Yes Scarboro, Audie Clear, NP  albuterol (PROVENTIL HFA;VENTOLIN HFA) 108 (90 Base) MCG/ACT inhaler Inhale 2 puffs into the lungs every 6 (six) hours as needed for wheezing or shortness of breath. 12/05/16   Cammie Sickle, MD  fluticasone (FLONASE) 50 MCG/ACT nasal spray Place 2 sprays into both nostrils daily. 02/07/18   Leone Haven, MD  furosemide (LASIX) 20 MG tablet Take 1 tablet by mouth daily as needed for edema.     [provider]    Inpatient Medications: Scheduled Meds: . enoxaparin (LOVENOX) injection  40 mg Subcutaneous Q24H  . furosemide  40 mg Intravenous BID  . ipratropium-albuterol  3 mL Nebulization Q4H while awake  . latanoprost  1 drop Both Eyes BID  . methylPREDNISolone (SOLU-MEDROL) injection  40 mg Intravenous Q12H  . sodium chloride flush  3 mL Intravenous Q12H   Continuous Infusions: . piperacillin-tazobactam (ZOSYN)  IV 3.375 g (05/10/18 1315)   PRN Meds: acetaminophen **OR** acetaminophen, hydrALAZINE, morphine injection, ondansetron **OR**  ondansetron (ZOFRAN) IV, polyethylene glycol, polyvinyl alcohol, sodium chloride, traMADol  Allergies:    Allergies  Allergen Reactions  . Tamsulosin Shortness Of Breath and Other (See Comments)    dizziness  . Cefdinir Other (See Comments)    SOB  . Gold-Containing Drug Products Other (See Comments)    Proteinuria  . Hydrochlorothiazide Other (See Comments)    BP dropped  . Sulfa Antibiotics Nausea Only    Social History:   Social History   Socioeconomic History  . Marital status: Widowed    Spouse name: Not on file  . Number of children: 2  . Years of education: Not on file  . Highest education level: Not on file  Occupational History  . Occupation: Retired   Scientific laboratory technician  . Financial resource strain: Patient refused  . Food insecurity:    Worry: Patient refused    Inability: Patient refused  . Transportation needs:    Medical: Patient refused    Non-medical: Patient refused  Tobacco Use  . Smoking status: Former Smoker    Packs/day: 2.00    Years: 63.00    Pack years: 126.00    Types: Cigarettes    Last attempt to quit: 01/22/2014    Years since quitting:  4.2  . Smokeless tobacco: Never Used  . Tobacco comment: Stopped smoking cigarettes Sunday and uses the vapor e-cig to help with cravings  Substance and Sexual Activity  . Alcohol use: Yes    Alcohol/week: 15.0 standard drinks    Types: 15 Standard drinks or equivalent per week    Comment: 2-3 beers at night  . Drug use: No  . Sexual activity: Yes  Lifestyle  . Physical activity:    Days per week: Patient refused    Minutes per session: Patient refused  . Stress: Patient refused  Relationships  . Social connections:    Talks on phone: Patient refused    Gets together: Patient refused    Attends religious service: Patient refused    Active member of club or organization: Patient refused    Attends meetings of clubs or organizations: Patient refused    Relationship status: Patient refused  . Intimate  partner violence:    Fear of current or ex partner: No    Emotionally abused: No    Physically abused: No    Forced sexual activity: No  Other Topics Concern  . Not on file  Social History Narrative   Lives in [place] with son.      2 children, 2 grandchildren             Family History:    Family History  Problem Relation Age of Onset  . Stroke Father   . Heart disease Father   . Hypertension Father   . Heart disease Brother 43     ROS:  Please see the history of present illness.   Review of Systems  Unable to perform ROS: Acuity of condition  Constitutional: Positive for malaise/fatigue.  Respiratory: Positive for cough, shortness of breath and wheezing.   All other systems reviewed and are negative.   Poor historian, minimally conversant, unable to provide full review of systems  Physical Exam/Data:   Vitals:   05/10/18 1050 05/10/18 1051 05/10/18 1100 05/10/18 1200  BP:   (!) 134/101 130/62  Pulse: (!) 45 (!) 117 (!) 114 61  Resp: (!) 28 (!) 26 (!) 28 (!) 26  Temp:      TempSrc:      SpO2: 91% 92% (!) 86% 99%  Weight:      Height:        Intake/Output Summary (Last 24 hours) at 05/10/2018 1347 Last data filed at 05/10/2018 1001 Gross per 24 hour  Intake 552.99 ml  Output 150 ml  Net 402.99 ml   Filed Weights   05/08/18 0559 05/09/18 0700 05/09/18 1304  Weight: 62.4 kg 61.7 kg 60.4 kg   Body mass index is 20.25 kg/m.  General:  Frail and acutely ill elderly male. Not able to communicate d/t acuity of condition, on high flow nasal cannula  HEENT: Benign Neck: JVD not able to be assessed  Vascular: Radial pulses 2+ bilaterally   Cardiac: Tachycardic,  irregular with ectopy; systolic murmur Lungs: Poor inspiratory effort, coarse but reduced breath sounds, wheezing noted Abd: soft, nontender, no hepatomegaly  Ext: minimal to 1+ bilateral lower extremity edema Musculoskeletal:  No deformities Skin: warm and dry  Neuro:  no focal abnormalities  noted Psych:  Unable to communicate d/t acuity of medical condition  EKG: Sinus tachycardia with short runs of narrow complex tachycardia, right bundle branch block and left anterior fascicular block Telemetry:  Telemetry was personally reviewed and demonstrates:  Sinus tachycardia with ectopy, short runs of narrow complex  tachycardia  CV Studies:   Relevant CV Studies: TTE 05/10/2018  1. The left ventricle has moderate-severely reduced systolic function, with an ejection fraction of 30-35%. The cavity size was mildly dilated.Unable to exclude regional wall motion abnormality.Left ventricular diastolic Doppler parameters are  indeterminate  2. The right ventricle has normal systolic function. The cavity was normal. There is no increase in right ventricular wall thickness. Right ventricular systolic pressure is moderately elevated with an estimated pressure of 59.4 mmHg.  3. Left atrial size was moderately dilated.  4. Right atrial size was mildly dilated.  5. Tricuspid valve regurgitation is moderate.  6. Mitral valve regurgitation is moderate to severe by color flow Doppler.  7. Left pleural effusion  8. There is dilatation of the aortic root.  9. Significant arrhythmia noted.    Laboratory Data:  Chemistry Recent Labs  Lab 05/08/18 0322 05/09/18 1319 05/09/18 2030  NA 142 142 143  K 4.6 4.4 4.6  CL 92* 90* 92*  CO2 44* 47* 44*  GLUCOSE 87 139* 130*  BUN 19 22 21   CREATININE 0.49* 0.51* 0.60*  CALCIUM 8.7* 9.1 8.8*  GFRNONAA >60 >60 >60  GFRAA >60 >60 >60  ANIONGAP 6 5 7     Recent Labs  Lab 05/05/18 0445  PROT 5.5*  ALBUMIN 2.7*  AST 25  ALT 17  ALKPHOS 67  BILITOT 0.6   Hematology Recent Labs  Lab 05/05/18 0445 05/09/18 1319  WBC 8.1 19.4*  RBC 4.01* 4.27  HGB 11.1* 11.9*  HCT 36.5* 40.3  MCV 91.0 94.4  MCH 27.7 27.9  MCHC 30.4 29.5*  RDW 14.6 14.5  PLT 142* 191   Cardiac EnzymesNo results for input(s): TROPONINI in the last 168 hours. No results  for input(s): TROPIPOC in the last 168 hours.  BNPNo results for input(s): BNP, PROBNP in the last 168 hours.  DDimer No results for input(s): DDIMER in the last 168 hours.  Radiology/Studies:  Dg Chest 1 View  Result Date: 05/09/2018 CLINICAL DATA:  COPD exacerbation with shortness of breath. EXAM: Portable CHEST 1 VIEW COMPARISON:  05/07/2018 and earlier, including CT chest 04/02/2018. FINDINGS: Cardiac silhouette mildly enlarged, unchanged. Severe bullous emphysematous changes in the UPPER lobes as noted previously. New airspace consolidation in the LEFT LOWER LOBE superimposed upon chronic scarring and interstitial lung disease. No new abnormalities in the RIGHT lung. IMPRESSION: Acute pneumonia involving the LEFT LOWER LOBE LOBE superimposed upon chronic scarring, interstitial lung disease, and severe emphysema. Electronically Signed   By: Evangeline Dakin M.D.   On: 05/09/2018 09:11   Dg Chest 1 View  Result Date: 05/07/2018 CLINICAL DATA:  Short of breath.  History of COPD.  Former smoker. EXAM: CHEST  1 VIEW COMPARISON:  05/25/2018 and older exams. FINDINGS: Mild enlargement of the cardiopericardial silhouette, stable. No mediastinal or hilar masses. Lungs are hyperexpanded. Extensive emphysema with a marked paucity of markings in the upper lungs. There is lower lung zone bronchovascular crowding and chronic scarring. No evidence of pneumonia or pulmonary edema. No pleural effusion or pneumothorax. Skeletal structures are grossly intact. IMPRESSION: 1. No acute cardiopulmonary disease. 2. Advanced COPD/emphysema with areas of lower lung zone chronic scarring. Electronically Signed   By: Lajean Manes M.D.   On: 05/07/2018 10:30    Assessment and Plan:   Sinus Tachycardia, Ectopy Narrow complex tachycardia noted on telemetry Likely secondary to underlying respiratory distress -Limited options for management given low blood pressure, not a good candidate for beta-blockers given end-stage  COPD ----Potentially  could try amiodarone IV infusion or oral dosing to decrease burden of tachycardia and ectopy  Pulmonary hypertension Seen on echocardiogram, at least moderately elevated measurements Also with pleural effusion on left, moderate tricuspid regurg Would consider Lasix IV twice daily with close monitoring of renal function and effort to improve respiratory status  Cardiomyopathy,  Etiology unclear, presumed to be ischemic Not a good candidate for ischemic work-up at this time given respiratory distress Given low blood pressure not a good candidate for beta-blockers, ACE inhibitors, ARB  Acute on chronic COPD stage IV Notes indicating he is end-stage,  There has been some discussion with palliative  Influenza A Treated with Tamiflu   Total encounter time more than 110 minutes  Greater than 50% was spent in counseling and coordination of care with the patient    For questions or updates, please contact [facility] Please consult [email or web address] for contact info under   Signed, Esmond Plants, MD, Ph.D [facility]

## 2018-05-10 NOTE — Plan of Care (Signed)
Pt still on HFOxygen, cont on IV ABT. Afib with rate controlled low 100's. PRN Ultram given for back per pt request.

## 2018-05-10 NOTE — Progress Notes (Signed)
Pharmacy ICU Monitoring Consult:  Pharmacy consulted to assist in monitoring and replacing electrolytes in this 78 y.o. male admitted on 05/25/2018 with Shortness of Breath   Labs:  Sodium (mmol/L)  Date Value  05/09/2018 143  02/01/2012 139   Potassium (mmol/L)  Date Value  05/09/2018 4.6  02/01/2012 4.5   Magnesium (mg/dL)  Date Value  05/09/2018 1.7  01/31/2012 2.0   Phosphorus (mg/dL)  Date Value  05/07/2018 3.9   Calcium (mg/dL)  Date Value  05/09/2018 8.8 (L)   Calcium, Total (mg/dL)  Date Value  02/01/2012 9.2   Albumin (g/dL)  Date Value  05/05/2018 2.7 (L)  01/30/2012 2.8 (L)    Assessment/Plan: 1. Electrolytes: Patient receiving furosemide 40mg  IV Q12hr. Will order magnesium 2 g IV x 1 and obtain electrolytes with am labs.   2. Glucose: no therapy warranted at this time.   3. Constipation: patient initiated on senna docusate 2 tabs BID. Will continue to monitor.   Pharmacy will continue to monitor and adjust per consult.   Simpson,Michael L 05/10/2018 4:36 PM

## 2018-05-11 ENCOUNTER — Ambulatory Visit: Payer: Medicare Other

## 2018-05-11 ENCOUNTER — Ambulatory Visit: Payer: Medicare Other | Admitting: Family Medicine

## 2018-05-11 DIAGNOSIS — I255 Ischemic cardiomyopathy: Secondary | ICD-10-CM

## 2018-05-11 DIAGNOSIS — I272 Pulmonary hypertension, unspecified: Secondary | ICD-10-CM

## 2018-05-11 DIAGNOSIS — I428 Other cardiomyopathies: Secondary | ICD-10-CM

## 2018-05-11 LAB — BASIC METABOLIC PANEL
ANION GAP: 14 (ref 5–15)
BUN: 27 mg/dL — AB (ref 8–23)
CO2: 45 mmol/L — ABNORMAL HIGH (ref 22–32)
Calcium: 8.7 mg/dL — ABNORMAL LOW (ref 8.9–10.3)
Chloride: 86 mmol/L — ABNORMAL LOW (ref 98–111)
Creatinine, Ser: 0.71 mg/dL (ref 0.61–1.24)
GFR calc Af Amer: >60 mL/min (ref >60)
GFR calc non Af Amer: >60 mL/min (ref >60)
GLUCOSE: 126 mg/dL — AB (ref 70–99)
Potassium: 3.7 mmol/L (ref 3.5–5.1)
Sodium: 145 mmol/L (ref 135–145)

## 2018-05-11 LAB — PROCALCITONIN: Procalcitonin: <0.1 ng/mL

## 2018-05-11 LAB — MAGNESIUM: Magnesium: 2.3 mg/dL (ref 1.7–2.4)

## 2018-05-11 MED ORDER — PANTOPRAZOLE SODIUM 40 MG PO TBEC
40.0000 mg | DELAYED_RELEASE_TABLET | Freq: Every day | ORAL | Status: DC
  Administered 2018-05-12 – 2018-05-14 (×3): 40 mg via ORAL
  Filled 2018-05-11 (×3): qty 1

## 2018-05-11 MED ORDER — ACETAZOLAMIDE SODIUM 500 MG IJ SOLR
250.0000 mg | Freq: Once | INTRAMUSCULAR | Status: AC
  Administered 2018-05-11: 250 mg via INTRAVENOUS
  Filled 2018-05-11: qty 250

## 2018-05-11 MED ORDER — POTASSIUM CHLORIDE CRYS ER 20 MEQ PO TBCR
20.0000 meq | EXTENDED_RELEASE_TABLET | Freq: Two times a day (BID) | ORAL | Status: DC
  Administered 2018-05-11 (×2): 20 meq via ORAL
  Filled 2018-05-11 (×2): qty 1

## 2018-05-11 NOTE — Progress Notes (Signed)
Progress Note  Patient Name: Peter Johnston Date of Encounter: 05/11/2018  Primary Cardiologist: new to [place] to have significant respiratory distress, high flow oxygen Received Lasix IV x2 yesterday, greater than 1 L negative Stable renal function Telemetry shows continued sinus tachycardia with frequent ectopy   Inpatient Medications    Scheduled Meds: . enoxaparin (LOVENOX) injection  40 mg Subcutaneous Q24H  . furosemide  40 mg Intravenous BID  . ipratropium-albuterol  3 mL Nebulization Q4H while awake  . latanoprost  1 drop Both Eyes BID  . methylPREDNISolone (SOLU-MEDROL) injection  40 mg Intravenous Q12H  . pantoprazole  40 mg Oral Daily  . senna-docusate  2 tablet Oral BID  . sodium chloride flush  3 mL Intravenous Q12H   Continuous Infusions: . piperacillin-tazobactam (ZOSYN)  IV 12.5 mL/hr at 05/11/18 0600   PRN Meds: acetaminophen **OR** acetaminophen, bisacodyl, hydrALAZINE, morphine injection, ondansetron **OR** ondansetron (ZOFRAN) IV, polyvinyl alcohol, sodium chloride, traMADol   Vital Signs    Vitals:   05/11/18 1000 05/11/18 1010 05/11/18 1100 05/11/18 1139  BP: (!) 166/141  (!) 130/99   Pulse: (!) 104  (!) 51   Resp: (!) 31  (!) 27   Temp:      TempSrc:      SpO2:  92%  93%  Weight:      Height:        Intake/Output Summary (Last 24 hours) at 05/11/2018 1306 Last data filed at 05/11/2018 0900 Gross per 24 hour  Intake 324.33 ml  Output 1950 ml  Net -1625.67 ml   Last 3 Weights 05/11/2018 05/09/2018 05/09/2018  Weight (lbs) 128 lb 1.4 oz 133 lb 2.5 oz 136 lb 0.4 oz  Weight (kg) 58.1 kg 60.4 kg 61.7 kg      Telemetry    Sinus tachycardia with frequent ectopy, episodes of short narrow complex tachycardia- Personally Reviewed  ECG     - Personally Reviewed  Physical Exam   Constitutional: Respiratory distress, appears gravely ill, persistent dry mouth HENT:  Head: Grossly normal Eyes:  no discharge. No  scleral icterus.  Neck: No JVD, no carotid bruits  Cardiovascular: Tachycardic, frequent ectopy, 2/6 systolic ejection murmur appreciated left sternal border Pulmonary/Chest: Decreased breath sounds bilaterally, coarse, Rales Abdominal: Soft.  no distension.  no tenderness.  Thin Musculoskeletal: Normal range of motion Neurological: Some muscle atrophy, full exam not performed Skin: Skin warm and dry Psychiatric: Alert, conversant   Labs    Chemistry Recent Labs  Lab 05/05/18 0445  05/09/18 1319 05/09/18 2030 05/11/18 0849  NA 141   < > 142 143 145  K 4.1   < > 4.4 4.6 3.7  CL 95*   < > 90* 92* 86*  CO2 43*   < > 47* 44* 45*  GLUCOSE 97   < > 139* 130* 126*  BUN 16   < > 22 21 27*  CREATININE 0.50*   < > 0.51* 0.60* 0.71  CALCIUM 8.8*   < > 9.1 8.8* 8.7*  PROT 5.5*  --   --   --   --   ALBUMIN 2.7*  --   --   --   --   AST 25  --   --   --   --   ALT 17  --   --   --   --   ALKPHOS 67  --   --   --   --   BILITOT 0.6  --   --   --   --  GFRNONAA >60   < > >60 >60 >60  GFRAA >60   < > >60 >60 >60  ANIONGAP 3*   < > 5 7 14    < > = values in this interval not displayed.     Hematology Recent Labs  Lab 05/05/18 0445 05/09/18 1319  WBC 8.1 19.4*  RBC 4.01* 4.27  HGB 11.1* 11.9*  HCT 36.5* 40.3  MCV 91.0 94.4  MCH 27.7 27.9  MCHC 30.4 29.5*  RDW 14.6 14.5  PLT 142* 191    Cardiac EnzymesNo results for input(s): TROPONINI in the last 168 hours. No results for input(s): TROPIPOC in the last 168 hours.   BNPNo results for input(s): BNP, PROBNP in the last 168 hours.   DDimer No results for input(s): DDIMER in the last 168 hours.   Radiology    No results found.  Cardiac Studies     Patient Profile     IBRAHEM VOLKMAN is a 78 y.o. male with a hx of newly diagnosed HFrEF (EF 30-35%, TTE 05/10/2018), pulmonary HTN (59.83mmHg, TTE 04/2018), aortic atherosclerosis, CAD (per CT 1/6),  hypertension, acute on chronic severe stage IV COPD in the setting of  influenza A, chronic respiratory failure on home oxygen, former smoker, past lung CA of left upper lobe s/p radiation therapy, rheumatoid arthritis, and iron deficiency anemia who is being seen today for the evaluation of bigeminy /PVCs  Assessment & Plan    Sinus Tachycardia, Ectopy Narrow complex tachycardia noted on telemetry Likely secondary to underlying respiratory distress --Limited options for medications to control his arrhythmia If felt it would help clinically, could try amiodarone for suppression of his ectopy and short runs of narrow complex tachycardia  Pulmonary hypertension Seen on echocardiogram, at least moderately elevated measurements Also with pleural effusion on left, moderate tricuspid regurg -Would continue Lasix IV twice daily with periodic monitoring of renal function. -He may only tolerate several more days before showing prerenal state given he does not have significant fluid intake  Cardiomyopathy,  Etiology unclear, presumed to be ischemic Not a good candidate for ischemic work-up at this time given respiratory distress Given low blood pressure not a good candidate for beta-blockers, ACE inhibitors, ARB -Diuresis as above to assist with respiratory distress  Acute on chronic COPD stage IV Notes indicating he is end-stage,  There has been some discussion with palliative -On high flow oxygen  Influenza A Treated with Tamiflu, now finished   Total encounter time more than 25 minutes  Greater than 50% was spent in counseling and coordination of care with the patient   For questions or updates, please contact [facility] Please consult [email or web address] for contact info under        Signed, Ida Rogue, MD  05/11/2018, 1:06 PM

## 2018-05-11 NOTE — Progress Notes (Signed)
[facility] [place] NAME: Peter Johnston    MR#:  [id number]  DATE OF BIRTH:  09/10/1940  SUBJECTIVE:  CHIEF COMPLAINT:   Chief Complaint  Patient presents with  . Shortness of Breath   Patient was transferred back to the ICU on 05/09/2018.  CODE STATUS readdressed with patient and family by intensivist.  Patient made DNR.  Managing conservatively with ultimate plan of transitioning to comfort care measures.  Palliative care team following patient.  REVIEW OF SYSTEMS:  Review of Systems  Constitutional: Negative for chills and fever.  HENT: Negative for ear pain and hearing loss.   Eyes: Negative for blurred vision and double vision.  Respiratory: Positive for shortness of breath. Negative for hemoptysis.   Cardiovascular: Negative for chest pain, palpitations and orthopnea.  Gastrointestinal: Negative for heartburn.  Genitourinary: Negative for dysuria and urgency.  Musculoskeletal: Negative for back pain and myalgias.  Skin: Negative for itching and rash.  Neurological: Negative for dizziness.  Psychiatric/Behavioral: Negative for depression and hallucinations.    DRUG ALLERGIES:   Allergies  Allergen Reactions  . Tamsulosin Shortness Of Breath and Other (See Comments)    dizziness  . Cefdinir Other (See Comments)    SOB  . Gold-Containing Drug Products Other (See Comments)    Proteinuria  . Hydrochlorothiazide Other (See Comments)    BP dropped  . Sulfa Antibiotics Nausea Only   VITALS:  Blood pressure 117/67, pulse (!) 40, temperature 98.2 F (36.8 C), temperature source Oral, resp. rate (!) 26, height 5\' 8"  (1.727 m), weight 58.1 kg, SpO2 93 %. PHYSICAL EXAMINATION:  Physical Exam   GENERAL:  78 y.o.-year-old patient lying in the bed with no acute distress.  EYES: Pupils equal, round, reactive to light and accommodation. No scleral icterus. Extraocular muscles intact.  HEENT: Head atraumatic, normocephalic.  Oropharynx and nasopharynx clear.  NECK:  Supple, no jugular venous distention. No thyroid enlargement, no tenderness.  LUNGS: diminished air entry bilaterally. CARDIOVASCULAR: S1, S2 normal. No murmurs, rubs, or gallops.  ABDOMEN: Soft, nontender, nondistended. Bowel sounds present. No organomegaly or mass.  EXTREMITIES: No pedal edema, cyanosis, or clubbing.  Generalized weakness NEUROLOGIC: Cranial nerves II through XII are intact. Muscle strength 5/5 in all extremities. Sensation intact. Gait not checked.  PSYCHIATRIC: The patient is alert and oriented x 3.  SKIN: No obvious rash, lesion, or ulcer.   LABORATORY PANEL:  Male CBC Recent Labs  Lab 05/09/18 1319  WBC 19.4*  HGB 11.9*  HCT 40.3  PLT 191   ------------------------------------------------------------------------------------------------------------------ Chemistries  Recent Labs  Lab 05/05/18 0445  05/11/18 0339 05/11/18 0849  NA 141   < >  --  145  K 4.1   < >  --  3.7  CL 95*   < >  --  86*  CO2 43*   < >  --  45*  GLUCOSE 97   < >  --  126*  BUN 16   < >  --  27*  CREATININE 0.50*   < >  --  0.71  CALCIUM 8.8*   < >  --  8.7*  MG 1.6*   < > 2.3  --   AST 25  --   --   --   ALT 17  --   --   --   ALKPHOS 67  --   --   --   BILITOT 0.6  --   --   --    < > =  values in this interval not displayed.   RADIOLOGY:  No results found. ASSESSMENT AND PLAN:    78 year old male patient with history of COPD, lung cancer follows Dr. Daun Peacock as an outpatient per pulmonary comes in because of shortness of breath and also confusion found to have hypercapnia.  1 Severe acute hypoxic and hypercapnic respiratory failure secondary to influenza A infection Recently improved.  Was transferred out of ICU recently. Had to be transferred back to ICU on 05/09/2018 due to worsening hypoxia and requiring up to 10 L of oxygen via high flow nasal cannula this morning. Continue bronchodilators and IV steroids.  Treated  with Tamiflu ,steroids .treated with antibiotics recently.   Further management by intensivist in the ICU  Repeat ABG reviewed recently.  Repeat chest x-ray recently with acute pneumonia involving the left lower lobe superimposed upon chronic scarring and interstitial lung disease.  Started on IV antibiotics with IV Zosyn and tolerating well.   Case manager was working on setting up trilogy for home use on discharge from the hospital Patient being managed conservatively at this time with plans to ultimately transition to comfort care when patient and family agreeable Currently DNR.  Palliative care team following   2.  Metabolic encephalopathy secondary to hypercapnia; improved .Monitor  3.  COPD exacerbation, recently treated with antibiotics with aztreonam IV , Solu-Medrol , duo nebs. Critical, prognosis guarded, patient has underlying severe COPD and follows up with pulmonary. For use of trilogy on discharge from the hospital to decrease risk of mortality and recurrent admissions.  4.  Hypomagnesemia and hypophosphatemia Replaced.  5.  History of lung cancer Follow-up with his oncologist post discharge from the hospital  6.  Abnormal rhythms on telemetry Noted to have bigeminy's Likely related to strain on the heart from end-stage COPD. 2D echocardiogram done with ejection fraction of 30 to 35%.  Unable to exclude regional wall motion abnormalities. Patient seen by cardiologist.  Not a good candidate for beta-blocker due to end-stage COPD potentially could try amiodarone infusion of p.o. to decrease burden of tachycardia and ectopy.  Appreciate input from cardiologist  Disposition; physical therapist to evaluate with recommendations Palliative care team following patient.    DVT prophylaxis; Lovenox  All the records are reviewed and case discussed with Care Management/Social Worker. Management plans discussed with the patient, family and they are in agreement.  CODE STATUS: DNR     TOTAL TIME TAKING CARE OF THIS PATIENT: 43minutes.   More than 50% of the time was spent in counseling/coordination of care: YES  POSSIBLE D/C IN 2 DAYS, DEPENDING ON CLINICAL CONDITION.   Arsema Tusing M.D on 05/11/2018 at 4:00 PM  Between 7am to 6pm - Pager - [phone]  After 6pm go to [email or web address] - [employer] [facility]  Office  [phone]  CC: Primary care physician; Leone Haven, MD  Note: This dictation was prepared with Dragon dictation along with smaller phrase technology. Any transcriptional errors that result from this process are unintentional.

## 2018-05-11 NOTE — Progress Notes (Signed)
Follow up - Critical Care Medicine Note  Patient Details:    Peter Johnston is an 78 y.o. male. This is 78 year old former smoker with a history of severe stage IV COPD (FEV1 0.86 L or 29% of predicted in 2013) followed by Dr. Nathanial Johnston for his COPD issues.  He presented on this 3 February to [facility] with exacerbation of COPD due to influenza A.  He has completed Tamiflu therapy.  He required BiPAP early on in his hospitalization.  He was transferred out of the ICU on 06 May 2018.  He returns ICU  with increasing dyspnea and O2 requirements.  He is DNR/DNI status.  Lines, Airways, Drains:    Anti-infectives:  Anti-infectives (From admission, onward)   Start     Dose/Rate Route Frequency Ordered Stop   05/09/18 1700  piperacillin-tazobactam (ZOSYN) IVPB 3.375 g     3.375 g 12.5 mL/hr over 240 Minutes Intravenous Every 8 hours 05/09/18 1647     05/09/18 1415  aztreonam (AZACTAM) 1 g in sodium chloride 0.9 % 100 mL IVPB  Status:  Discontinued     1 g 200 mL/hr over 30 Minutes Intravenous Every 8 hours 05/09/18 1412 05/09/18 1450   05/01/18 1115  azithromycin (ZITHROMAX) 500 mg in sodium chloride 0.9 % 250 mL IVPB     500 mg 250 mL/hr over 60 Minutes Intravenous Daily 05/01/18 1106 05/05/18 1140   05/12/2018 2200  doxycycline (VIBRA-TABS) tablet 100 mg  Status:  Discontinued     100 mg Oral Every 12 hours 05/18/2018 1403 05/01/18 1106   05/03/2018 1500  oseltamivir (TAMIFLU) capsule 75 mg     75 mg Oral 2 times daily 05/02/2018 1434 05/05/18 0959   05/21/2018 1300  doxycycline (VIBRAMYCIN) 100 mg in sodium chloride 0.9 % 250 mL IVPB     100 mg 125 mL/hr over 120 Minutes Intravenous  Once 05/02/2018 1251 05/15/2018 1612      Microbiology: Results for orders placed or performed during the hospital encounter of 05/22/2018  MRSA PCR Screening     Status: None   Collection Time: 05/07/2018  3:26 PM  Result Value Ref Range Status   MRSA by PCR NEGATIVE NEGATIVE Final    Comment:        The GeneXpert  MRSA Assay (FDA approved for NASAL specimens only), is one component of a comprehensive MRSA colonization surveillance program. It is not intended to diagnose MRSA infection nor to guide or monitor treatment for MRSA infections. Performed at [facility], [address], [place], [place] [address]     Best Practice/Protocols:  VTE Prophylaxis: Lovenox (prophylaxtic dose) GI Prophylaxis: Proton Pump Inhibitor Comfort care  Events: Transferred to ICU because of increasing respiratory distress and lethargy.  Studies: Dg Chest 1 View  Result Date: 05/09/2018 CLINICAL DATA:  COPD exacerbation with shortness of breath. EXAM: Portable CHEST 1 VIEW COMPARISON:  05/07/2018 and earlier, including CT chest 04/02/2018. FINDINGS: Cardiac silhouette mildly enlarged, unchanged. Severe bullous emphysematous changes in the UPPER lobes as noted previously. New airspace consolidation in the LEFT LOWER LOBE superimposed upon chronic scarring and interstitial lung disease. No new abnormalities in the RIGHT lung. IMPRESSION: Acute pneumonia involving the LEFT LOWER LOBE LOBE superimposed upon chronic scarring, interstitial lung disease, and severe emphysema. Electronically Signed   By: Peter Johnston M.D.   On: 05/09/2018 09:11   Dg Chest 1 View  Result Date: 05/07/2018 CLINICAL DATA:  Short of breath.  History of COPD.  Former smoker. EXAM: CHEST  1 VIEW COMPARISON:  04/28/2018 and older exams. FINDINGS: Mild enlargement of the cardiopericardial silhouette, stable. No mediastinal or hilar masses. Lungs are hyperexpanded. Extensive emphysema with a marked paucity of markings in the upper lungs. There is lower lung zone bronchovascular crowding and chronic scarring. No evidence of pneumonia or pulmonary edema. No pleural effusion or pneumothorax. Skeletal structures are grossly intact. IMPRESSION: 1. No acute cardiopulmonary disease. 2. Advanced COPD/emphysema with areas of lower lung zone chronic  scarring. Electronically Signed   By: Peter Johnston M.D.   On: 05/07/2018 10:30   Dg Chest 2 View  Result Date: 05/17/2018 CLINICAL DATA:  Shortness of breath.  Productive cough. EXAM: CHEST - 2 VIEW COMPARISON:  Chest CT dated 04/02/2018 and chest x-ray dated 10/16/2015 FINDINGS: Heart size is within normal limits considering the AP technique. Aortic atherosclerosis. Severe emphysema. There is chronic marked accentuation of the interstitial markings in the left midzone and at low both lung bases, slightly progressed since 2017 but essentially unchanged since the recent CT scan. No acute bone abnormality. Chronic blunting of the posterior costophrenic angles. IMPRESSION: Severe chronic emphysema and interstitial lung disease, essentially unchanged since the CT scan of 04/02/2018. Aortic Atherosclerosis (ICD10-I70.0) and Emphysema (ICD10-J43.9). Electronically Signed   By: Peter Johnston M.D.   On: 05/02/2018 12:03    Consults: Treatment Team:  Peter Merritts, MD [facility], [facility], MD   Subjective:    Overnight Issues: Having ectopy again.  Bursts of supraventricular tachycardia.  2D echo yesterday shows LVEF of 30 to 35%.  Cannot assess wall motion abnormality.  Moderate to severe pulmonary hypertension noted.  Patient is very debilitated.  Now requiring 100% FiO2 on high flow  Objective:  Vital signs for last 24 hours: Temp:  [97.4 F (36.3 C)-98.2 F (36.8 C)] 98.2 F (36.8 C) (02/14 0820) Pulse Rate:  [42-108] 51 (02/14 1100) Resp:  [15-34] 27 (02/14 1100) BP: (108-166)/(59-141) 130/99 (02/14 1100) SpO2:  [85 %-96 %] 93 % (02/14 1139) FiO2 (%):  [80 %-100 %] 98 % (02/14 1139) Weight:  [58.1 kg] 58.1 kg (02/14 0500)  Hemodynamic parameters for last 24 hours:    Intake/Output from previous day: 02/13 0701 - 02/14 0700 In: 567.3 [P.O.:360; I.V.:3; IV Piggyback:204.3] Out: 1950 [Urine:1950]  Intake/Output this shift: No intake/output data recorded.  Vent settings for last 24  hours: FiO2 (%):  [80 %-100 %] 98 %  Physical Exam:  General: Acute on chronically ill-appearing sleepy but easily awakened.  Chronic use of accessories  HEENT: Edentulous, oral mucosa dry.  No thrush. Neck: Supple no JVD noted.  Trachea midline.  No crepitus. Lungs: Distant breath sounds, coarse, no wheezes noted.  Poor air movement. Cardiac: Regular rate and rhythm with frequent extrasystoles. Abdomen: Soft,protruberant, nondistended, normoactive bowel sounds. Extremities:Sarcopenia noted. Neurologic: Lethargic but arousable.  Answers questions appropriately. Skin: Multiple ecchymotic areas.  Very thin skin.  Monitor with occasional bursts of SVT at 150 bpm or more.  No associated hemodynamic instability  Assessment/Plan:   1.  Acute on chronic mixed respiratory failure due to decompensation of COPD.  Initial event was due to influenza A.  Has completed Tamiflu.  Continue high flow O2.  Continue duo nebs and IV steroids.  He also has a left lower lobe pneumonia which may be aggravating the issue this is superimposed on severe COPD and chronic interstitial changes.  Continue Zosyn.  High flow O2 with maintenance of saturations between 88 to 92%. Treat conservatively with aim towards comfort care.  2.  COPD stage IV by GOLD criteria (very severe COPD): The patient has very advanced COPD.  Continue IV steroids for now.  Continue DuoNebs every 4 hours while awake.  Continue morphine as needed for air hunger and dyspnea.  3.  Left lower lobe pneumonia after influenza: Zosyn per pharmacy.  4.  Recent influenza A: Finished Tamiflu.  5.  Frequent ectopy in the setting of newly diagnosed cardiomyopathy and pulmonary hypertension: Monitor electrolytes.  Replete as needed.  Continue diuretics Lasix 40 mg twice a day.  6.  Metabolic alkalosis, multifactorial: This is due to compensatory mechanism for hypercarbia and diuretic use.  Will give one-time dose of Diamox.  7.  End-of-life discussion:  Patient is DNR/DNI.  This was reiterated today with the son and the patient's sister.  Have discussed with palliative care today several times.   LOS: 11 days   Additional comments:  Total visit time: 35 minutes   C.Derrill Kay, MD [facility]  05/11/2018  *Care during the described time interval was provided by me and/or other providers on the critical care team.  I have reviewed this patient's available data, including medical history, events of note, physical examination and test results as part of my evaluation.

## 2018-05-11 NOTE — Progress Notes (Signed)
Palliative:   Peter Johnston is resting quietly in bed.  He looks quite frail, nearing end of life.  Sometime during the early morning hours he removed his oxygen and desaturated to the 40's.  He had been on 85% FiO2, and was slow to recover, therefore respiratory therapy increased oxygen to 100%.  He continues to show a saturation of 90 to 93% on 100% FiO2.  Peter Johnston will make and somewhat keep eye contact.  He is calm and cooperative, and when asked how he feels he states, "not too good".  Present today is Peter Johnston, Peter Johnston and her husband Peter Johnston, significant other of 10 years Peter Johnston.  We talk about Peter Johnston quick decline in oxygenation after removing his nasal cannula last night, desaturation, increasing to 100% Fio2.  We also reviewed vital signs which show increased HR, RR and low saturation rt Fi02.  We talk about unburdening Peter Johnston from medications and treatments that are not changing what is happening.   Peter Johnston tells me that he wants to continue all treatments at this time, including those that could be painful, such as Lovenox.  We talk about comfort care and that if/when he is no longer able to communicate and in a coma like state would it be all right for family to make him comfortable. He clearly states that he give family the permission to transition to full comfort care.   Opal Johnston and her husband Peter Johnston and I talked briefly in the hallway.  I encouraged them to share with Peter Johnston that they will continue to care for his Peter Johnston, they agree  I return later in the day and speak with Peter Johnston, who tells me that family realizes that Peter Johnston is critically ill, and is probably nearing end of life.  I ask about Peter Ronnie's understanding; Opal Johnston shares that she believes Edd Johnston understands that Peter Johnston is nearing end-of-life.  We talk about prognosis with permission. I share that days, even with current treatment, is expected.  Pain asks about transition to residential hospice.  We talk  about the will to live even in the most difficult positions, and that Peter Johnston tells me that he is willing to continue, therefore he does not qualify for residential hospice.  I shared that even if he were in agreement with full comfort care at this point, I do not believe he is stable enough to transfer.  I shared that I anticipate hospital death.  I encourage Opal Johnston of Peter Johnston agreement for comfort care when he is no longer responding.   Conference with nursing staff related to goals of care, symptom management, family dynamics, end-of-life.  Conference with CCM related to goals of care discussion, family dynamics, and end-of-life.  65 minutes, extended time Quinn Axe, NP  Palliative Medicine Team  Team Phone # [phone]  Greater than 50% of this time was spent counseling and coordinating care related to the above assessment and plan.

## 2018-05-11 NOTE — Progress Notes (Signed)
Pharmacy ICU Monitoring Consult:  Pharmacy consulted to assist in monitoring and replacing electrolytes in this 78 y.o. male admitted on 05/22/2018 with Shortness of Breath   Labs:  Sodium (mmol/L)  Date Value  05/11/2018 145  02/01/2012 139   Potassium (mmol/L)  Date Value  05/11/2018 3.7  02/01/2012 4.5   Magnesium (mg/dL)  Date Value  05/11/2018 2.3  01/31/2012 2.0   Phosphorus (mg/dL)  Date Value  05/07/2018 3.9   Calcium (mg/dL)  Date Value  05/11/2018 8.7 (L)   Calcium, Total (mg/dL)  Date Value  02/01/2012 9.2   Albumin (g/dL)  Date Value  05/05/2018 2.7 (L)  01/30/2012 2.8 (L)    Assessment/Plan: 1. Electrolytes: Patient receiving furosemide 40mg  IV Q12hr. Patient received magnesium 2 g IV x 1 on 2/13. Will obtain electrolytes with am labs.   2. Glucose: no therapy warranted at this time.   3. Constipation: patient initiated on senna docusate 2 tabs BID. Will continue to monitor.   Pharmacy will continue to monitor and adjust per consult.   Avier Jech L 05/11/2018 4:11 PM

## 2018-05-11 NOTE — Consult Note (Signed)
Pharmacy Antibiotic Note  Peter Johnston is a 78 y.o. male admitted on 05/03/2018 with pneumonia.  Pharmacy has been consulted for Zosyn dosing.  Plan: Continue Zosyn 3.375g IV q8h (4 hour infusion).   Patient with listed allergy to Cefdinir but no documentation of penicillin allergy.  Height: 5\' 8"  (172.7 cm) Weight: 128 lb 1.4 oz (58.1 kg) IBW/kg (Calculated) : 68.4  Temp (24hrs), Avg:97.8 F (36.6 C), Min:97.4 F (36.3 C), Max:98.2 F (36.8 C)  Recent Labs  Lab 05/05/18 0445  05/07/18 0314 05/08/18 0322 05/09/18 1319 05/09/18 2030 05/11/18 0849  WBC 8.1  --   --   --  19.4*  --   --   CREATININE 0.50*   < > 0.51* 0.49* 0.51* 0.60* 0.71   < > = values in this interval not displayed.    Estimated Creatinine Clearance: 61.5 mL/min (by C-G formula based on SCr of 0.71 mg/dL).    Allergies  Allergen Reactions  . Tamsulosin Shortness Of Breath and Other (See Comments)    dizziness  . Cefdinir Other (See Comments)    SOB  . Gold-Containing Drug Products Other (See Comments)    Proteinuria  . Hydrochlorothiazide Other (See Comments)    BP dropped  . Sulfa Antibiotics Nausea Only    Antimicrobials this admission: Zosyn 2/12 >>  Azithromycin  2/4 >> 2/8 Doxycycline 2/3>>2/4   Dose adjustments this admission:  Microbiology results: 2/3 MRSA PCR: negative  Thank you for allowing pharmacy to be a part of this patient's care.  Simpson,Michael L 05/11/2018 4:12 PM

## 2018-05-12 LAB — CBC
HCT: 41.3 % (ref 39.0–52.0)
Hemoglobin: 12 g/dL — ABNORMAL LOW (ref 13.0–17.0)
MCH: 27.8 pg (ref 26.0–34.0)
MCHC: 29.1 g/dL — ABNORMAL LOW (ref 30.0–36.0)
MCV: 95.6 fL (ref 80.0–100.0)
Platelets: 206 K/uL (ref 150–400)
RBC: 4.32 MIL/uL (ref 4.22–5.81)
RDW: 14.2 % (ref 11.5–15.5)
WBC: 21.5 K/uL — ABNORMAL HIGH (ref 4.0–10.5)
nRBC: 0 % (ref 0.0–0.2)

## 2018-05-12 LAB — BASIC METABOLIC PANEL
Anion gap: 9 (ref 5–15)
BUN: 31 mg/dL — ABNORMAL HIGH (ref 8–23)
CO2: 50 mmol/L — ABNORMAL HIGH (ref 22–32)
Calcium: 9 mg/dL (ref 8.9–10.3)
Chloride: 85 mmol/L — ABNORMAL LOW (ref 98–111)
Creatinine, Ser: 0.78 mg/dL (ref 0.61–1.24)
GFR calc Af Amer: >60 mL/min (ref >60)
GFR calc non Af Amer: >60 mL/min (ref >60)
Glucose, Bld: 131 mg/dL — ABNORMAL HIGH (ref 70–99)
POTASSIUM: 3.3 mmol/L — AB (ref 3.5–5.1)
Sodium: 144 mmol/L (ref 135–145)

## 2018-05-12 LAB — MAGNESIUM: Magnesium: 2.1 mg/dL (ref 1.7–2.4)

## 2018-05-12 MED ORDER — POTASSIUM CHLORIDE CRYS ER 20 MEQ PO TBCR
40.0000 meq | EXTENDED_RELEASE_TABLET | Freq: Two times a day (BID) | ORAL | Status: AC
  Administered 2018-05-12 – 2018-05-13 (×4): 40 meq via ORAL
  Filled 2018-05-12 (×4): qty 2

## 2018-05-12 MED ORDER — BISACODYL 5 MG PO TBEC
5.0000 mg | DELAYED_RELEASE_TABLET | Freq: Once | ORAL | Status: AC
  Administered 2018-05-12: 5 mg via ORAL
  Filled 2018-05-12: qty 1

## 2018-05-12 MED ORDER — FUROSEMIDE 10 MG/ML IJ SOLN
40.0000 mg | Freq: Every day | INTRAMUSCULAR | Status: DC
  Administered 2018-05-13 – 2018-05-14 (×2): 40 mg via INTRAVENOUS
  Filled 2018-05-12 (×2): qty 4

## 2018-05-12 NOTE — Progress Notes (Signed)
Pharmacy ICU Monitoring Consult:  Pharmacy consulted to assist in monitoring and replacing electrolytes in this 78 y.o. male admitted on 05/03/2018 with Shortness of Breath   Labs:  Sodium (mmol/L)  Date Value  05/12/2018 144  02/01/2012 139   Potassium (mmol/L)  Date Value  05/12/2018 3.3 (L)  02/01/2012 4.5   Magnesium (mg/dL)  Date Value  05/12/2018 2.1  01/31/2012 2.0   Phosphorus (mg/dL)  Date Value  05/07/2018 3.9   Calcium (mg/dL)  Date Value  05/12/2018 9.0   Calcium, Total (mg/dL)  Date Value  02/01/2012 9.2   Albumin (g/dL)  Date Value  05/05/2018 2.7 (L)  01/30/2012 2.8 (L)    Assessment/Plan: 1. Electrolytes: Patient receiving furosemide 40mg  IV Q12hr. Patient was started on oral KCl 20 mEq BID on 2/14. Will increase dose to 40 mEq BID. Will obtain electrolytes with am labs.   2. Glucose: patient is on IV steroids but BG remains stable: no therapy warranted at this time.   3. Constipation: patient initiated on senna docusate 2 tabs BID 2/13, LBM 2/11. Will add bisacodyl. Will continue to monitor.   Pharmacy will continue to monitor and adjust per consult.   Dallie Piles, PharmD 05/12/2018 8:44 AM

## 2018-05-12 NOTE — Progress Notes (Signed)
[place] [facility] [place] NAME: Peter Johnston    MR#:  [id number]  DATE OF BIRTH:  11/13/1940  SUBJECTIVE:  CHIEF COMPLAINT:   Chief Complaint  Patient presents with  . Shortness of Breath   Patient was transferred back to the ICU on 05/09/2018.  CODE STATUS readdressed with patient and family by intensivist.  Patient made DNR.  Managing conservatively with ultimate plan of transitioning to comfort care measures.  Palliative care team following patient.  Patient on high flow nasal cannula this morning, alert, awake, oriented.  REVIEW OF SYSTEMS:  Review of Systems  Constitutional: Negative for chills and fever.  HENT: Negative for ear pain and hearing loss.   Eyes: Negative for blurred vision and double vision.  Respiratory: Positive for shortness of breath. Negative for hemoptysis.   Cardiovascular: Negative for chest pain, palpitations and orthopnea.  Gastrointestinal: Negative for heartburn.  Genitourinary: Negative for dysuria and urgency.  Musculoskeletal: Negative for back pain and myalgias.  Skin: Negative for itching and rash.  Neurological: Negative for dizziness.  Psychiatric/Behavioral: Negative for depression and hallucinations.    DRUG ALLERGIES:   Allergies  Allergen Reactions  . Tamsulosin Shortness Of Breath and Other (See Comments)    dizziness  . Cefdinir Other (See Comments)    SOB  . Gold-Containing Drug Products Other (See Comments)    Proteinuria  . Hydrochlorothiazide Other (See Comments)    BP dropped  . Sulfa Antibiotics Nausea Only   VITALS:  Blood pressure 133/72, pulse 98, temperature 97.9 F (36.6 C), temperature source Axillary, resp. rate 18, height 5\' 8"  (1.727 m), weight 58.1 kg, SpO2 94 %. PHYSICAL EXAMINATION:  Physical Exam   GENERAL:  78 y.o.-year-old patient lying in the bed with with some respiratory distress. EYES: Pupils equal, round, reactive to light.. No scleral icterus. Extraocular  muscles intact.  HEENT: Head atraumatic, normocephalic. Oropharynx and nasopharynx clear.  NECK:  Supple, no jugular venous distention. No thyroid enlargement, no tenderness.  LUNGS: diminished air entry bilaterally. CARDIOVASCULAR: S1, S2 normal. No murmurs, rubs, or gallops.  ABDOMEN: Soft, nontender, nondistended. Bowel sounds present. No organomegaly or mass.  EXTREMITIES: No pedal edema, cyanosis, or clubbing.  Generalized weakness NEUROLOGIC: Cranial nerves II through XII are intact. Muscle strength 5/5 in all extremities. Sensation intact. Gait not checked.  PSYCHIATRIC: The patient is alert and oriented x 3.  SKIN: No obvious rash, lesion, or ulcer.   LABORATORY PANEL:  Male CBC Recent Labs  Lab 05/12/18 0528  WBC 21.5*  HGB 12.0*  HCT 41.3  PLT 206   ------------------------------------------------------------------------------------------------------------------ Chemistries  Recent Labs  Lab 05/12/18 0528  NA 144  K 3.3*  CL 85*  CO2 50*  GLUCOSE 131*  BUN 31*  CREATININE 0.78  CALCIUM 9.0  MG 2.1   RADIOLOGY:  No results found. ASSESSMENT AND PLAN:    78 year old male patient with history of COPD, lung cancer follows Dr. Daun Peacock as an outpatient per pulmonary comes in because of shortness of breath and also confusion found to have hypercapnia.  1 Severe acute hypoxic and hypercapnic respiratory failure secondary to influenza A infection Recently improved.  Was transferred out of ICU recently. Had to be transferred back to ICU on 05/09/2018 due to worsening hypoxia and requiring up to 10 L of oxygen via high flow nasal cannula this morning. Continue bronchodilators and IV steroids.  Treated with Tamiflu ,steroids .treated with antibiotics recently.   Further management by intensivist in  the ICU, received a dose of Diamox yesterday.   Repeat chest x-ray recently with acute pneumonia involving the left lower lobe superimposed upon chronic scarring and  interstitial lung disease.  Started on IV antibiotics with IV Zosyn and tolerating well.   Case manager was working on setting up trilogy for home use on discharge from the hospital Patient being managed conservatively at this time with plans to ultimately transition to comfort care when patient and family agreeable Currently DNR.  Palliative care team following   2.  Metabolic encephalopathy secondary to hypercapnia; improved .Monitor  3.  COPD exacerbation, recently treated with antibiotics with aztreonam IV , Solu-Medrol , duo nebs. Critical, prognosis guarded, patient has underlying severe COPD and follows up with pulmonary. For use of trilogy on discharge from the hospital to decrease risk of mortality and recurrent admissions.  4.  Hypomagnesemia and hypophosphatemia Replaced.  5.  History of lung cancer Follow-up with his oncologist post discharge from the hospital  6.  Abnormal rhythms on telemetry Noted to have bigeminy's Likely related to strain on the heart from end-stage COPD. 2D echocardiogram done with ejection fraction of 30 to 35%.  Unable to exclude regional wall motion abnormalities. Patient seen by cardiologist.  Not a good candidate for beta-blocker due to end-stage COPD potentially could try amiodarone infusion of p.o. to decrease burden of tachycardia and ectopy.  Appreciate input from cardiologist #7. hypokalemia: Replace. #8 leukocytosis likely due to steroids.  Follow the trend  Disposition; palliative care is following.  Family realizes that [name] understands that he is nearing end-of-life, critically ill, they will decide about comfort care cares.  For now continue present care.  DVT prophylaxis; Lovenox  All the records are reviewed and case discussed with Care Management/Social Worker. Management plans discussed with the patient, family and they are in agreement.  CODE STATUS: DNR   TOTAL TIME TAKING CARE OF THIS PATIENT: 62minutes.   More than 50%  of the time was spent in counseling/coordination of care: YES  POSSIBLE D/C IN 2 DAYS, DEPENDING ON CLINICAL CONDITION.   Epifanio Lesches M.D on 05/12/2018 at 7:57 AM  Between 7am to 6pm - Pager - [phone]  After 6pm go to [email or web address] - Technical brewer [facility]  Office  [phone]  CC: Primary care physician; Leone Haven, MD  Note: This dictation was prepared with Dragon dictation along with smaller phrase technology. Any transcriptional errors that result from this process are unintentional.

## 2018-05-12 NOTE — Progress Notes (Signed)
Follow up - Critical Care Medicine Note  Patient Details:    Peter Johnston is an 78 y.o. male. This is 78 year old former smoker with a history of severe stage IV COPD (FEV1 0.86 L or 29% of predicted in 2013) followed by Dr. Nathanial Rancher for his COPD issues.  He presented on this 3 February to [facility] with exacerbation of COPD due to influenza A.  He has completed Tamiflu therapy.  He required BiPAP early on in his hospitalization.  He was transferred out of the ICU on 06 May 2018.  He returns ICU  with increasing dyspnea and O2 requirements.  He is DNR/DNI status.  Lines, Airways, Drains:    Anti-infectives:  Anti-infectives (From admission, onward)   Start     Dose/Rate Route Frequency Ordered Stop   05/09/18 1700  piperacillin-tazobactam (ZOSYN) IVPB 3.375 g     3.375 g 12.5 mL/hr over 240 Minutes Intravenous Every 8 hours 05/09/18 1647     05/09/18 1415  aztreonam (AZACTAM) 1 g in sodium chloride 0.9 % 100 mL IVPB  Status:  Discontinued     1 g 200 mL/hr over 30 Minutes Intravenous Every 8 hours 05/09/18 1412 05/09/18 1450   05/01/18 1115  azithromycin (ZITHROMAX) 500 mg in sodium chloride 0.9 % 250 mL IVPB     500 mg 250 mL/hr over 60 Minutes Intravenous Daily 05/01/18 1106 05/05/18 1140   05/03/2018 2200  doxycycline (VIBRA-TABS) tablet 100 mg  Status:  Discontinued     100 mg Oral Every 12 hours 05/17/2018 1403 05/01/18 1106   05/09/2018 1500  oseltamivir (TAMIFLU) capsule 75 mg     75 mg Oral 2 times daily 04/29/2018 1434 05/05/18 0959   05/20/2018 1300  doxycycline (VIBRAMYCIN) 100 mg in sodium chloride 0.9 % 250 mL IVPB     100 mg 125 mL/hr over 120 Minutes Intravenous  Once 05/12/2018 1251 05/12/2018 1612      Microbiology: Results for orders placed or performed during the hospital encounter of 05/10/2018  MRSA PCR Screening     Status: None   Collection Time: 05/03/2018  3:26 PM  Result Value Ref Range Status   MRSA by PCR NEGATIVE NEGATIVE Final    Comment:        The GeneXpert  MRSA Assay (FDA approved for NASAL specimens only), is one component of a comprehensive MRSA colonization surveillance program. It is not intended to diagnose MRSA infection nor to guide or monitor treatment for MRSA infections. Performed at [facility], [address], [place], [place] [address]     Best Practice/Protocols:  VTE Prophylaxis: Lovenox (prophylaxtic dose) GI Prophylaxis: Proton Pump Inhibitor Comfort care  Events: Transferred to ICU because of increasing respiratory distress and lethargy.  Studies: Dg Chest 1 View  Result Date: 05/09/2018 CLINICAL DATA:  COPD exacerbation with shortness of breath. EXAM: Portable CHEST 1 VIEW COMPARISON:  05/07/2018 and earlier, including CT chest 04/02/2018. FINDINGS: Cardiac silhouette mildly enlarged, unchanged. Severe bullous emphysematous changes in the UPPER lobes as noted previously. New airspace consolidation in the LEFT LOWER LOBE superimposed upon chronic scarring and interstitial lung disease. No new abnormalities in the RIGHT lung. IMPRESSION: Acute pneumonia involving the LEFT LOWER LOBE LOBE superimposed upon chronic scarring, interstitial lung disease, and severe emphysema. Electronically Signed   By: Evangeline Dakin M.D.   On: 05/09/2018 09:11   Dg Chest 1 View  Result Date: 05/07/2018 CLINICAL DATA:  Short of breath.  History of COPD.  Former smoker. EXAM: CHEST  1 VIEW COMPARISON:  05/09/2018 and older exams. FINDINGS: Mild enlargement of the cardiopericardial silhouette, stable. No mediastinal or hilar masses. Lungs are hyperexpanded. Extensive emphysema with a marked paucity of markings in the upper lungs. There is lower lung zone bronchovascular crowding and chronic scarring. No evidence of pneumonia or pulmonary edema. No pleural effusion or pneumothorax. Skeletal structures are grossly intact. IMPRESSION: 1. No acute cardiopulmonary disease. 2. Advanced COPD/emphysema with areas of lower lung zone chronic  scarring. Electronically Signed   By: Lajean Manes M.D.   On: 05/07/2018 10:30   Dg Chest 2 View  Result Date: 05/16/2018 CLINICAL DATA:  Shortness of breath.  Productive cough. EXAM: CHEST - 2 VIEW COMPARISON:  Chest CT dated 04/02/2018 and chest x-ray dated 10/16/2015 FINDINGS: Heart size is within normal limits considering the AP technique. Aortic atherosclerosis. Severe emphysema. There is chronic marked accentuation of the interstitial markings in the left midzone and at low both lung bases, slightly progressed since 2017 but essentially unchanged since the recent CT scan. No acute bone abnormality. Chronic blunting of the posterior costophrenic angles. IMPRESSION: Severe chronic emphysema and interstitial lung disease, essentially unchanged since the CT scan of 04/02/2018. Aortic Atherosclerosis (ICD10-I70.0) and Emphysema (ICD10-J43.9). Electronically Signed   By: Lorriane Shire M.D.   On: 05/19/2018 12:03    Consults: Treatment Team:  Minna Merritts, MD [facility], [facility], MD   Subjective:    Overnight Issues: Having ectopy again.  Bursts of supraventricular tachycardia persist.  2D echo showed LVEF of 30 to 35%.  Cannot assess wall motion abnormality.  Moderate to severe pulmonary hypertension noted.  Patient is very debilitated.  High flow O2 being titrated only minimally.  RT understands goal is 88 to 92%.  Patient desaturates very quickly with the slightest movement.  He voices no active complaint.  Was asking for the remote and some ice cream otherwise no complaints.  Objective:  Vital signs for last 24 hours: Temp:  [97.5 F (36.4 C)-98.4 F (36.9 C)] 98 F (36.7 C) (02/15 1600) Pulse Rate:  [31-114] 110 (02/15 1600) Resp:  [18-32] 27 (02/15 1600) BP: (96-133)/(63-84) 132/82 (02/15 1200) SpO2:  [89 %-97 %] 94 % (02/15 1600) FiO2 (%):  [95 %-99 %] 95 % (02/15 1600)  Hemodynamic parameters for last 24 hours:    Intake/Output from previous day: 02/14 0701 - 02/15  0700 In: 246 [P.O.:240; I.V.:6] Out: 100 [Urine:100]  Intake/Output this shift: Total I/O In: 240 [P.O.:240] Out: -   Vent settings for last 24 hours: FiO2 (%):  [95 %-99 %] 95 %  Physical Exam:  General: Acute on chronically ill-appearing sleepy but easily awakened.  Chronic use of accessories  HEENT: Edentulous, oral mucosa moist.  No thrush. Neck: Supple no JVD noted.  Trachea midline.  No crepitus. Lungs: Distant breath sounds, coarse, no wheezes noted.  Poor air movement. Cardiac: Tachycardic rate with regular rhythm and frequent extrasystoles. Abdomen: Soft,protruberant, nondistended, normoactive bowel sounds. Extremities:Sarcopenia noted. Neurologic: Her alert today.  Answers questions appropriately.  Interactive today.  No overt focal deficit, generalized debility. Skin: Multiple ecchymotic areas.  Very thin skin.    Assessment/Plan:   1.  Acute on chronic mixed respiratory failure due to decompensation of COPD.  Initial event was due to influenza A.  Has completed Tamiflu.  Continue high flow O2.  Continue duo nebs and IV steroids.  He also has a left lower lobe pneumonia which may be aggravating the issue this is superimposed on severe COPD and chronic interstitial  changes.  Continue Zosyn until complete 7 days which should be Friday 19 February.  High flow O2 with maintenance of saturations between 88 to 92%. Treat conservatively with aim towards comfort care.  Patient and family pondering transitioning towards complete comfort care.  Remains DNR/DNI  2.  COPD stage IV by GOLD criteria (very severe COPD): The patient has very advanced COPD.  Continue IV steroids for now.  Consider tapering in the morning.  Continue DuoNebs every 4 hours while awake.  Continue morphine as needed for air hunger and dyspnea.  3.  Left lower lobe pneumonia after influenza: Zosyn per pharmacy.  Should complete therapy 2/19.  4.  Recent influenza A: Finished Tamiflu.  5.  Frequent ectopy in the  setting of newly diagnosed cardiomyopathy and pulmonary hypertension: Monitor electrolytes.  Replete as needed.  Continue diuretics Lasix 40 mg twice a day.  6.  Metabolic alkalosis, multifactorial: This is due to compensatory mechanism for hypercarbia and diuretic use as well as mild hypokalemia.  Potassium being supplemented.  7.  End-of-life issues: Patient is DNR/DNI.  This was reiterated today with the son and the patient's sister.  Have discussed with palliative care today several times.   LOS: 12 days   Additional comments:  Total visit time: 35 minutes   C.Derrill Kay, MD [facility]  05/12/2018  *Care during the described time interval was provided by me and/or other providers on the critical care team.  I have reviewed this patient's available data, including medical history, events of note, physical examination and test results as part of my evaluation.

## 2018-05-13 LAB — POTASSIUM: POTASSIUM: 3.9 mmol/L (ref 3.5–5.1)

## 2018-05-13 LAB — MAGNESIUM: Magnesium: 1.9 mg/dL (ref 1.7–2.4)

## 2018-05-13 NOTE — Progress Notes (Signed)
Pt resting. A&O, breathing regular and pt denies SOB. Report pain level of 2 on a 0-10 pain scale. Pain located in lower ABD. PRN morphine given see MAR. Will continue to monitor.

## 2018-05-13 NOTE — Progress Notes (Signed)
Pharmacy ICU Monitoring Consult:  Pharmacy consulted to assist in monitoring and replacing electrolytes in this 78 y.o. male admitted on 05/16/2018 with Shortness of Breath   Labs:  Sodium (mmol/L)  Date Value  05/12/2018 144  02/01/2012 139   Potassium (mmol/L)  Date Value  05/13/2018 3.9  02/01/2012 4.5   Magnesium (mg/dL)  Date Value  05/13/2018 1.9  01/31/2012 2.0   Phosphorus (mg/dL)  Date Value  05/07/2018 3.9   Calcium (mg/dL)  Date Value  05/12/2018 9.0   Calcium, Total (mg/dL)  Date Value  02/01/2012 9.2   Albumin (g/dL)  Date Value  05/05/2018 2.7 (L)  01/30/2012 2.8 (L)    Assessment/Plan: 1. Electrolytes: Patient receiving furosemide 40mg  IV Q24hr. Patient was started on oral KCl 20 mEq BID on 2/14 and increased to 40 mEq BID.  Will obtain electrolytes with am labs.   2. Glucose: patient is on IV steroids but BG remains stable: no therapy warranted at this time.   3. Constipation: patient initiated on senna docusate 2 tabs BID 2/13, LBM 2/15. Will continue to monitor.   Pharmacy will continue to monitor and adjust per consult.   Dallie Piles, PharmD 05/13/2018 10:36 AM

## 2018-05-13 NOTE — Progress Notes (Signed)
A&O. Breathing unlabored. Denies SOB, SPO2 96%, denies pain at this time. Will continue to monitor.

## 2018-05-13 NOTE — Progress Notes (Signed)
Peter Johnston at [place] NAME: Peter Johnston    MR#:  [id number]  DATE OF BIRTH:  [birth year]  SUBJECTIVE:  CHIEF COMPLAINT:   Chief Complaint  Patient presents with  . Shortness of Breath   Patient is alert, awake, oriented.  Complains of back pain. REVIEW OF SYSTEMS:  Review of Systems  Constitutional: Negative for chills and fever.  HENT: Negative for ear pain and hearing loss.   Eyes: Negative for blurred vision and double vision.  Respiratory: Positive for shortness of breath. Negative for hemoptysis.   Cardiovascular: Negative for chest pain, palpitations and orthopnea.  Gastrointestinal: Negative for heartburn.  Genitourinary: Negative for dysuria and urgency.  Musculoskeletal: Negative for back pain and myalgias.  Skin: Negative for itching and rash.  Neurological: Negative for dizziness.  Psychiatric/Behavioral: Negative for depression and hallucinations.    DRUG ALLERGIES:   Allergies  Allergen Reactions  . Tamsulosin Shortness Of Breath and Other (See Comments)    dizziness  . Cefdinir Other (See Comments)    SOB  . Gold-Containing Drug Products Other (See Comments)    Proteinuria  . Hydrochlorothiazide Other (See Comments)    BP dropped  . Sulfa Antibiotics Nausea Only   VITALS:  Blood pressure 126/74, pulse (!) 111, temperature 99 F (37.2 C), temperature source Oral, resp. rate (!) 22, height 5\' 8"  (1.727 m), weight 56.2 kg, SpO2 94 %. PHYSICAL EXAMINATION:  Physical Exam   GENERAL:  78 y.o.-year-old patient lying in the bed with with some respiratory distress. EYES: Pupils equal, round, reactive to light.. No scleral icterus. Extraocular muscles intact.  HEENT: Head atraumatic, normocephalic. Oropharynx and nasopharynx clear.  NECK:  Supple, no jugular venous distention. No thyroid enlargement, no tenderness.  LUNGS: diminished air entry bilaterally. CARDIOVASCULAR: S1, S2 normal. No murmurs, rubs, or  gallops.  ABDOMEN: Soft, nontender, nondistended. Bowel sounds present. No organomegaly or mass.  EXTREMITIES: No pedal edema, cyanosis, or clubbing.  Generalized weakness NEUROLOGIC: Cranial nerves II through XII are intact. Muscle strength 5/5 in all extremities. Sensation intact. Gait not checked.  PSYCHIATRIC: The patient is alert and oriented x 3.  SKIN: No obvious rash, lesion, or ulcer.   LABORATORY PANEL:  Male CBC Recent Labs  Lab 05/12/18 0528  WBC 21.5*  HGB 12.0*  HCT 41.3  PLT 206   ------------------------------------------------------------------------------------------------------------------ Chemistries  Recent Labs  Lab 05/12/18 0528 05/13/18 0451  NA 144  --   K 3.3* 3.9  CL 85*  --   CO2 50*  --   GLUCOSE 131*  --   BUN 31*  --   CREATININE 0.78  --   CALCIUM 9.0  --   MG 2.1 1.9   RADIOLOGY:  No results found. ASSESSMENT AND PLAN:    78 year old male patient with history of COPD, lung cancer follows Dr. Daun Johnston as an outpatient per pulmonary comes in because of shortness of breath and also confusion found to have hypercapnia.  1 .Severe acute hypoxic and hypercapnic respiratory failure secondary to influenza A infection Recently improved.  Was transferred out of ICU recently. Had to be transferred back to ICU on 05/09/2018 due to worsening hypoxia and requiring up to 10 L of oxygen via high flow nasal cannula this morning. Continue bronchodilators and IV steroids.  Treated with Tamiflu ,steroids .treated with antibiotics recently.   Further management by intensivist in the ICU, needing as needed diuretics.  Repeat chest x-ray recently with acute pneumonia involving the left  lower lobe superimposed upon chronic scarring and interstitial lung disease.  Started on IV antibiotics with IV Zosyn and tolerating well.   Case manager was working on setting up trilogy for home use on discharge from the hospital Patient being managed conservatively at  this time with plans to ultimately transition to comfort care when patient and family agreeable Currently DNR.  Palliative care team following   2.  Metabolic encephalopathy secondary to hypercapnia; improved .Monitor  3.  COPD exacerbation, recently treated with antibiotics with aztreonam IV , Solu-Medrol , duo nebs. Critical, prognosis guarded, patient has underlying severe COPD and follows up with pulmonary. For use of trilogy on discharge from the hospital to decrease risk of mortality and recurrent admissions.  4.  Hypomagnesemia and hypophosphatemia Replaced.  5.  History of lung cancer Follow-up with his oncologist post discharge from the hospital  6.  Abnormal rhythms on telemetry Noted to have bigeminy's Likely related to strain on the heart from end-stage COPD. 2D echocardiogram done with ejection fraction of 30 to 35%.  Unable to exclude regional wall motion abnormalities. Patient seen by cardiologist.  Not a good candidate for beta-blocker due to end-stage COPD potentially could try amiodarone infusion of p.o. to decrease burden of tachycardia and ectopy.  Appreciate input from cardiologist #7. hypokalemia: Replace. #8 leukocytosis likely due to steroids.  Follow the trend  Disposition; palliative care is following.  Family realizes that Peter Johnston understands that he is nearing end-of-life, critically ill, they will decide about comfort care cares.  For now continue present care.  DVT prophylaxis; Lovenox  All the records are reviewed and case discussed with Care Management/Social Worker. Management plans discussed with the patient, family and they are in agreement.  CODE STATUS: DNR   TOTAL TIME TAKING CARE OF THIS PATIENT: 73minutes.   More than 50% of the time was spent in counseling/coordination of care: YES  POSSIBLE D/C IN 2 DAYS, DEPENDING ON CLINICAL CONDITION.   Peter Johnston M.D on 05/13/2018 at 10:11 AM  Between 7am to 6pm - Pager -  [phone]  After 6pm go to [email or web address] - Technical brewer [facility]  Office  [phone]  CC: Primary care physician; Peter Haven, MD  Note: This dictation was prepared with Dragon dictation along with smaller phrase technology. Any transcriptional errors that result from this process are unintentional.

## 2018-05-13 NOTE — Progress Notes (Signed)
Follow up - Critical Care Medicine Note  Patient Details:    Peter Johnston is an 78 y.o. male. This is 79 year old former smoker with a history of severe stage IV COPD (FEV1 0.86 L or 29% of predicted in 2013) followed by Dr. Nathanial Rancher for his COPD issues.  He presented on this 3 February to [facility] with exacerbation of COPD due to influenza A.  He has completed Tamiflu therapy.  He required BiPAP early on in his hospitalization.  He was transferred out of the ICU on 06 May 2018.  He returns ICU  with increasing dyspnea and O2 requirements.  He is DNR/DNI status.  Lines, Airways, Drains:    Anti-infectives:  Anti-infectives (From admission, onward)   Start     Dose/Rate Route Frequency Ordered Stop   05/09/18 1700  piperacillin-tazobactam (ZOSYN) IVPB 3.375 g     3.375 g 12.5 mL/hr over 240 Minutes Intravenous Every 8 hours 05/09/18 1647     05/09/18 1415  aztreonam (AZACTAM) 1 g in sodium chloride 0.9 % 100 mL IVPB  Status:  Discontinued     1 g 200 mL/hr over 30 Minutes Intravenous Every 8 hours 05/09/18 1412 05/09/18 1450   05/01/18 1115  azithromycin (ZITHROMAX) 500 mg in sodium chloride 0.9 % 250 mL IVPB     500 mg 250 mL/hr over 60 Minutes Intravenous Daily 05/01/18 1106 05/05/18 1140   05/08/2018 2200  doxycycline (VIBRA-TABS) tablet 100 mg  Status:  Discontinued     100 mg Oral Every 12 hours 05/25/2018 1403 05/01/18 1106   05/18/2018 1500  oseltamivir (TAMIFLU) capsule 75 mg     75 mg Oral 2 times daily 04/29/2018 1434 05/05/18 0959   05/01/2018 1300  doxycycline (VIBRAMYCIN) 100 mg in sodium chloride 0.9 % 250 mL IVPB     100 mg 125 mL/hr over 120 Minutes Intravenous  Once 05/18/2018 1251 05/23/2018 1612      Microbiology: Results for orders placed or performed during the hospital encounter of 05/21/2018  MRSA PCR Screening     Status: None   Collection Time: 05/15/2018  3:26 PM  Result Value Ref Range Status   MRSA by PCR NEGATIVE NEGATIVE Final    Comment:        The GeneXpert  MRSA Assay (FDA approved for NASAL specimens only), is one component of a comprehensive MRSA colonization surveillance program. It is not intended to diagnose MRSA infection nor to guide or monitor treatment for MRSA infections. Performed at [facility], [address], [place],  [address]     Best Practice/Protocols:  VTE Prophylaxis: Lovenox (prophylaxtic dose) GI Prophylaxis: Proton Pump Inhibitor Comfort care  Events: Transferred to ICU because of increasing respiratory distress and lethargy.  Studies: Dg Chest 1 View  Result Date: 05/09/2018 CLINICAL DATA:  COPD exacerbation with shortness of breath. EXAM: Portable CHEST 1 VIEW COMPARISON:  05/07/2018 and earlier, including CT chest 04/02/2018. FINDINGS: Cardiac silhouette mildly enlarged, unchanged. Severe bullous emphysematous changes in the UPPER lobes as noted previously. New airspace consolidation in the LEFT LOWER LOBE superimposed upon chronic scarring and interstitial lung disease. No new abnormalities in the RIGHT lung. IMPRESSION: Acute pneumonia involving the LEFT LOWER LOBE LOBE superimposed upon chronic scarring, interstitial lung disease, and severe emphysema. Electronically Signed   By: Evangeline Dakin M.D.   On: 05/09/2018 09:11   Dg Chest 1 View  Result Date: 05/07/2018 CLINICAL DATA:  Short of breath.  History of COPD.  Former smoker. EXAM: CHEST  1 VIEW COMPARISON:  05/24/2018 and older exams. FINDINGS: Mild enlargement of the cardiopericardial silhouette, stable. No mediastinal or hilar masses. Lungs are hyperexpanded. Extensive emphysema with a marked paucity of markings in the upper lungs. There is lower lung zone bronchovascular crowding and chronic scarring. No evidence of pneumonia or pulmonary edema. No pleural effusion or pneumothorax. Skeletal structures are grossly intact. IMPRESSION: 1. No acute cardiopulmonary disease. 2. Advanced COPD/emphysema with areas of lower lung zone chronic  scarring. Electronically Signed   By: Lajean Manes M.D.   On: 05/07/2018 10:30   Dg Chest 2 View  Result Date: 05/21/2018 CLINICAL DATA:  Shortness of breath.  Productive cough. EXAM: CHEST - 2 VIEW COMPARISON:  Chest CT dated 04/02/2018 and chest x-ray dated 10/16/2015 FINDINGS: Heart size is within normal limits considering the AP technique. Aortic atherosclerosis. Severe emphysema. There is chronic marked accentuation of the interstitial markings in the left midzone and at low both lung bases, slightly progressed since 2017 but essentially unchanged since the recent CT scan. No acute bone abnormality. Chronic blunting of the posterior costophrenic angles. IMPRESSION: Severe chronic emphysema and interstitial lung disease, essentially unchanged since the CT scan of 04/02/2018. Aortic Atherosclerosis (ICD10-I70.0) and Emphysema (ICD10-J43.9). Electronically Signed   By: Lorriane Shire M.D.   On: 05/07/2018 12:03    Consults: Treatment Team:  Pccm, Ander Gaster, MD   Subjective:    Overnight Issues: No new events overnight.  Very frail, debilitated.  Dyspnea unchanged.  He looks very uncomfortable.  Continues to reiterate DNR/DNI but not quite ready to transition to comfort care.  Objective:  Vital signs for last 24 hours: Temp:  [97.7 F (36.5 C)-99 F (37.2 C)] 98.6 F (37 C) (02/16 1950) Pulse Rate:  [25-118] 55 (02/16 2100) Resp:  [22-34] 29 (02/16 2100) BP: (98-137)/(60-100) 124/64 (02/16 2100) SpO2:  [86 %-96 %] 95 % (02/16 2100) FiO2 (%):  [84 %-99 %] 84 % (02/16 1954) Weight:  [56.2 kg] 56.2 kg (02/16 0500)  Hemodynamic parameters for last 24 hours:    Intake/Output from previous day: 02/15 0701 - 02/16 0700 In: 581.3 [P.O.:240; IV Piggyback:341.3] Out: 50 [Urine:50]  Intake/Output this shift: No intake/output data recorded.  Vent settings for last 24 hours: FiO2 (%):  [84 %-99 %] 84 %  Physical Exam:  General: Acute on chronically ill-appearing sleepy but easily  awakened.  Chronic use of accessories, looks uncomfortable today. HEENT: Edentulous, oral mucosa moist.  No thrush. Neck: Supple no JVD noted.  Trachea midline.  No crepitus. Lungs: Distant breath sounds, no wheezes noted.  Poor air entry bilaterally. Cardiac: Regular rate with regular rhythm and occasional extrasystoles. Abdomen: Soft,protruberant, nondistended, normoactive bowel sounds. Extremities:Sarcopenia noted. Neurologic: Less alert today.  Answers questions appropriately.  No overt focal deficit, generalized debility. Skin: Multiple ecchymotic areas.  Very thin skin.    Assessment/Plan:   1.  Acute on chronic mixed respiratory failure due to decompensation of COPD.  Initial event was due to influenza A.  Has completed Tamiflu.  Continue high flow O2.  Continue duo nebs and IV steroids.  He also has a left lower lobe pneumonia which may be aggravating the issue this is superimposed on severe COPD and chronic interstitial changes.  Continue Zosyn until complete 7 days which should be Wed 19 February.  High flow O2 with maintenance of saturations between 88 to 92%. Treat conservatively with aim towards comfort care.  Patient and family pondering transitioning towards complete comfort care.  Remains DNR/DNI  2.  COPD  stage IV by GOLD criteria (very severe COPD):  Continue IV steroids for now.  Consider tapering slowly.  Continue DuoNebs every 4 hours while awake.  Continue morphine as needed for air hunger and dyspnea.  3.  Left lower lobe pneumonia after influenza: Zosyn per pharmacy.  Should complete therapy 2/19.  4.  Recent influenza A: Finished Tamiflu.  5.  Frequent ectopy in the setting of newly diagnosed cardiomyopathy and pulmonary hypertension: Monitor electrolytes.  Replete as needed.  Continue diuretics Lasix 40 mg once a day.  May need to repeat Diamox.  6.  Metabolic alkalosis, multifactorial: This is due to compensatory mechanism for hypercarbia and diuretic use as well as  mild hypokalemia.  Potassium being supplemented.  7.  End-of-life issues: Patient is DNR/DNI.      LOS: 13 days   Additional comments:  Total visit time: 35 minutes   C.Derrill Kay, MD  PCCM  05/13/2018  *Care during the described time interval was provided by me and/or other providers on the critical care team.  I have reviewed this patient's available data, including medical history, events of note, physical examination and test results as part of my evaluation.

## 2018-05-14 ENCOUNTER — Encounter: Payer: Self-pay | Admitting: Internal Medicine

## 2018-05-14 LAB — CBC
HEMATOCRIT: 38.5 % — AB (ref 39.0–52.0)
Hemoglobin: 11.7 g/dL — ABNORMAL LOW (ref 13.0–17.0)
MCH: 28.1 pg (ref 26.0–34.0)
MCHC: 30.4 g/dL (ref 30.0–36.0)
MCV: 92.3 fL (ref 80.0–100.0)
Platelets: 182 10*3/uL (ref 150–400)
RBC: 4.17 MIL/uL — ABNORMAL LOW (ref 4.22–5.81)
RDW: 14.1 % (ref 11.5–15.5)
WBC: 29.1 10*3/uL — AB (ref 4.0–10.5)
nRBC: 0 % (ref 0.0–0.2)

## 2018-05-14 LAB — BASIC METABOLIC PANEL
Anion gap: 7 (ref 5–15)
BUN: 37 mg/dL — ABNORMAL HIGH (ref 8–23)
CO2: 46 mmol/L — ABNORMAL HIGH (ref 22–32)
Calcium: 9 mg/dL (ref 8.9–10.3)
Chloride: 87 mmol/L — ABNORMAL LOW (ref 98–111)
Creatinine, Ser: 0.62 mg/dL (ref 0.61–1.24)
GFR calc Af Amer: >60 mL/min (ref >60)
GFR calc non Af Amer: >60 mL/min (ref >60)
Glucose, Bld: 147 mg/dL — ABNORMAL HIGH (ref 70–99)
Potassium: 4.8 mmol/L (ref 3.5–5.1)
Sodium: 140 mmol/L (ref 135–145)

## 2018-05-14 MED ORDER — LEVOFLOXACIN 500 MG PO TABS
500.0000 mg | ORAL_TABLET | Freq: Every day | ORAL | Status: DC
  Administered 2018-05-14: 500 mg via ORAL
  Filled 2018-05-14: qty 1

## 2018-05-14 MED ORDER — BUDESONIDE 0.25 MG/2ML IN SUSP
0.2500 mg | Freq: Four times a day (QID) | RESPIRATORY_TRACT | Status: DC
  Administered 2018-05-14: 0.25 mg via RESPIRATORY_TRACT
  Filled 2018-05-14: qty 2

## 2018-05-14 MED ORDER — PREDNISONE 20 MG PO TABS: 40.0000 mg | ORAL_TABLET | Freq: Every day | ORAL | Status: DC

## 2018-05-14 MED ORDER — IPRATROPIUM-ALBUTEROL 0.5-2.5 (3) MG/3ML IN SOLN
3.0000 mL | Freq: Four times a day (QID) | RESPIRATORY_TRACT | Status: DC
  Administered 2018-05-14: 3 mL via RESPIRATORY_TRACT
  Filled 2018-05-14: qty 3

## 2018-05-17 ENCOUNTER — Telehealth: Payer: Self-pay | Admitting: Pulmonary Disease

## 2018-05-18 NOTE — Telephone Encounter (Signed)
Death Certificate completed. [place] aware.

## 2018-05-18 NOTE — Telephone Encounter (Signed)
Death certificate received, placed in DS box for signing.

## 2018-05-27 NOTE — Discharge Summary (Signed)
DEATH SUMMARY  DATE OF ADMISSION:  May 14, 2018  DATE OF DISCHARGE/DEATH:  05/28/2018  ADMISSION DIAGNOSES:   Acute on chronic hypoxemic and hypercarbic respiratory failure COPD exacerbation Lung cancer Hypertension   DISCHARGE DIAGNOSES:   End-stage COPD/emphysema Acute on chronic hypoxemic/hypercarbic respiratory failure COPD exacerbation Influenza pneumonia LLL consolidation, possible bacterial pneumonia   PRESENTATION:   Pt was admitted with the following HPI and the above admission diagnoses:  Peter Johnston  is a 78 y.o. male with a known history of lung cancer, COPD, chronic respiratory failure on status oxygen, hypertension presents to the emergency room due to worsening shortness of breath over the last 2 days with wheezing and green sputum.  He lives with his son who recently went through a cold and he thinks his cold effected him.  Here in the emergency room patient is awake and talking but falls back to sleep easily.  Found to have PCO2 of 101 with pH of 7.3.  He is being started on BiPAP.  X-ray shows severe emphysema but no infiltrates.  Afebrile. His pulmonologist outpatient is Dr. Humphrey Rolls.  HOSPITAL COURSE:   He was admitted to the ICU/SDU for noninvasive ventilatory support.  He completed a course of oseltamivir.  He was treated in the usual fashion for COPD exacerbation including antibacterial therapy as well as nebulized bronchodilators and systemic steroids.  He was transferred out of ICU on 05/06/2018.  He returned to the ICU on 05/13/2018.  He was supported with HF [place].  Palliative care service was involved addressing goals of care.  He was made DNR/DNI.  On the afternoon of 2018/05/28 he developed sudden bradycardia and apnea and passed away comfortably shortly thereafter.    Cause of death:  COPD exacerbation due to Influenza pneumonia  Contributing factors: Very severe COPD/emphysema  Autopsy:  No  Smoking:  Yes  Merton Border, MD PCCM service Mobile  [phone] Pager [phone] 05/15/2018 1:45 PM

## 2018-05-27 NOTE — Progress Notes (Signed)
Daily Progress Note   Patient Name: Peter Johnston       Date: 06/04/18 DOB: 03-Dec-1940  Age: 78 y.o. MRN#: [id number] Attending Physician: Peter Lesches, MD Primary Care Physician: Peter Haven, MD Admit Date: 05/25/2018  Reason for Consultation/Follow-up: Establishing goals of care  Subjective: Patient is resting in bed. He has a high flow cannula on and is breathing through his mouth. He shakes his head "no" when asked how he is doing today. He appears uncomfortable and RT is making adjustments. Son Peter Johnston is at bedside. Peter Johnston states he understands his father is not doing well. He states he will bring the patient's girlfriend tomorrow morning. He state " I realize we need to make some decisions."  Peter Johnston states he would want BIPAP, but no intubation.     Length of Stay: 14  Current Medications: Scheduled Meds:  . budesonide (PULMICORT) nebulizer solution  0.25 mg Nebulization Q6H  . enoxaparin (LOVENOX) injection  40 mg Subcutaneous Q24H  . ipratropium-albuterol  3 mL Nebulization Q6H  . latanoprost  1 drop Both Eyes BID  . levofloxacin  500 mg Oral Daily  . pantoprazole  40 mg Oral Daily  . [START ON 05/15/2018] predniSONE  40 mg Oral Q breakfast  . senna-docusate  2 tablet Oral BID  . sodium chloride flush  3 mL Intravenous Q12H    Continuous Infusions:   PRN Meds: acetaminophen **OR** acetaminophen, bisacodyl, hydrALAZINE, morphine injection, [DISCONTINUED] ondansetron **OR** ondansetron (ZOFRAN) IV, polyvinyl alcohol, sodium chloride, traMADol  Physical Exam Pulmonary:     Effort: Tachypnea present.     Comments: High flow cannula Neurological:     Mental Status: He is alert.             Vital Signs: BP 132/86   Pulse (!) 54   Temp 97.9 F (36.6 C)  (Axillary)   Resp (!) 27   Ht 5\' 8"  (1.727 m)   Wt 57.4 kg   SpO2 (!) 88%   BMI 19.24 kg/m  SpO2: SpO2: (!) 88 % O2 Device: O2 Device: High Flow Nasal Cannula O2 Flow Rate: O2 Flow Rate (L/min): 45 L/min  Intake/output summary:   Intake/Output Summary (Last 24 hours) at June 04, 2018 1507 Last data filed at 04-Jun-2018 1404 Gross per 24 hour  Intake 274.33 ml  Output -  Net 274.33 ml   LBM: Last BM Date: 05/12/18 Baseline Weight: Weight: 65.8 kg Most recent weight: Weight: 57.4 kg       Palliative Assessment/Data:    Flowsheet Rows     Most Recent Value  Intake Tab  Referral Department  Hospitalist  Unit at Time of Referral  Med/Surg Unit  Palliative Care Primary Diagnosis  Pulmonary  Date Notified  05/06/18  Palliative Care Type  New Palliative care  Reason for referral  Clarify Goals of Care  Date of Admission  05/13/2018  Date first seen by Palliative Care  05/07/18  # of days Palliative referral response time  1 Day(s)  # of days IP prior to Palliative referral  6  Clinical Assessment  Palliative Performance Scale Score  30%  Pain Max last 24 hours  Not able to report  Pain Min Last 24 hours  Not able to report  Dyspnea Max Last 24 Hours  Not able to report  Dyspnea Min Last 24 hours  Not able to report  Psychosocial & Spiritual Assessment  Palliative Care Outcomes  Patient/Family meeting held?  Yes  Who was at the meeting?  Patient at bedside  Palliative Care Outcomes  Clarified goals of care, Provided psychosocial or spiritual support      Patient Active Problem List   Diagnosis Date Noted  . Acute on chronic respiratory failure with hypoxia and hypercapnia (HCC)   . Goals of care, counseling/discussion   . Palliative care by specialist   . DNR (do not resuscitate) discussion   . COPD exacerbation ([place]) 05/08/2018  . Skin lesions 02/07/2018  . GI bleed 01/01/2018  . Rectal bleeding 01/01/2018  . Weakness of both lower extremities 08/02/2017  .  Interstitial lung disease ([place]) 04/25/2017  . Iron deficiency anemia due to chronic blood loss 10/17/2016  . Back pain 10/09/2016  . Lower extremity edema 10/09/2016  . Hyperlipidemia 03/03/2016  . Primary cancer of left upper lobe of lung ([place]) 11/09/2015  . Insomnia 10/16/2015  . Rheumatoid arthritis ([place]) 08/17/2015  . OP (osteoporosis) 08/17/2015  . Gout 08/17/2015  . Glaucoma 08/17/2015  . Coronary artery disease 07/22/2014  . Diarrhea 12/06/2013  . Lumbar canal stenosis 08/28/2013  . GERD (gastroesophageal reflux disease) 06/29/2011  . Iron deficiency anemia 06/29/2011  . Essential hypertension 06/07/2011  . Benign prostatic hypertrophy with urinary frequency 06/07/2011  . COPD, severe ([place]) 06/07/2011    Palliative Care Assessment & Plan     Recommendations/Plan:  Patient remains on high flow cannula. Plans for family meeting around 10:30 tomorrow morning.    Code Status:    Code Status Orders  (From admission, onward)         Start     Ordered   05/09/18 1317  Do not attempt resuscitation (DNR)  Continuous    Question Answer Comment  In the event of cardiac or respiratory ARREST Do not call a "code blue"   In the event of cardiac or respiratory ARREST Do not perform Intubation, CPR, defibrillation or ACLS   In the event of cardiac or respiratory ARREST Use medication by any route, position, wound care, and other measures to relive pain and suffering. May use oxygen, suction and manual treatment of airway obstruction as needed for comfort.      05/09/18 1318        Code Status History    Date Active Date Inactive Code Status Order ID Comments User Context   05/07/2018 1402 05/09/2018 1318 Full Code  [id number]  Hillary Bow, MD ED   01/01/2018 1729 01/03/2018 1748 Full Code [id number]  Saundra Shelling, MD Inpatient       Prognosis:  Very poor.      Care plan was discussed with CCM  Thank you for allowing the Palliative Medicine Team to assist in the  care of this patient.   Total Time 35 min Prolonged Time Billed no       Greater than 50%  of this time was spent counseling and coordinating care related to the above assessment and plan.  Asencion Gowda, NP  Please contact Palliative Medicine Team phone at [phone] for questions and concerns.

## 2018-05-27 NOTE — Progress Notes (Signed)
Pts son called RN to room saying pt was "dying". RN to assess pt, pt HR in the 50's and pt is not breathing. Pt DNR/DNI. Chaplin called for supportive services. Pt time of death 41. Son calling family members to come see pt.

## 2018-05-27 NOTE — Significant Event (Signed)
Called to patient's room emergently for sudden bradycardia, apnea and unresponsiveness. Pt is DNR/DNI. Cyanotic, no spontaneous respiratory efforts. HS absent. No palpable pulses. Death is imminent. Pt's son is in room @ bedside. We have notified chaplain. Other family members have been notified.   Merton Border, MD PCCM service Mobile [phone] Pager [phone] 24-May-2018 4:25 PM

## 2018-05-27 NOTE — Progress Notes (Signed)
   May 21, 2018 1700  Clinical Encounter Type  Visited With Family  Visit Type Death  Referral From Nurse  Consult/Referral To Nurse  Spiritual Encounters  Spiritual Needs Emotional;Grief support  Chaplain received page for death of patient. Chaplain arrived and son was coming out of patient rooms trying to make a call to family and Chaplain assist him. Son was crying and Chaplain support him with her pastoral  Presence. Chaplain waited with son and patient sister Opal Sidles) arrived. Chaplain introduced herself and gave her condolences. Chaplain stayed with sister while other family members left to go get other family. Once family returned they told Chaplain she could go they would be fine. Chaplain offered to stay longer family insist that the Reita Cliche was with them and they needed me to go and rest. (concerned because Chaplain was here for 24 hr shift) Chaplain try to talk them out of it but they were not changing their mind. Chaplain gave blessings and left.

## 2018-05-27 NOTE — Progress Notes (Signed)
[place] at [place] NAME: Peter Johnston    MR#:  [id number]  DATE OF BIRTH:  03/21/1941  SUBJECTIVE: Seen in intensive care unit, getting chest PT with bed.  CHIEF COMPLAINT:   Chief Complaint  Patient presents with  . Shortness of Breath   Patient is alert, awake, oriented.  Complains of back pain. REVIEW OF SYSTEMS:  Review of Systems  Constitutional: Negative for chills and fever.  HENT: Negative for ear pain and hearing loss.   Eyes: Negative for blurred vision and double vision.  Respiratory: Positive for shortness of breath. Negative for hemoptysis.   Cardiovascular: Negative for chest pain, palpitations and orthopnea.  Gastrointestinal: Negative for heartburn.  Genitourinary: Negative for dysuria and urgency.  Musculoskeletal: Negative for back pain and myalgias.  Skin: Negative for itching and rash.  Neurological: Negative for dizziness.  Psychiatric/Behavioral: Negative for depression and hallucinations.    DRUG ALLERGIES:   Allergies  Allergen Reactions  . Tamsulosin Shortness Of Breath and Other (See Comments)    dizziness  . Cefdinir Other (See Comments)    SOB  . Gold-Containing Drug Products Other (See Comments)    Proteinuria  . Hydrochlorothiazide Other (See Comments)    BP dropped  . Sulfa Antibiotics Nausea Only   VITALS:  Blood pressure 97/66, pulse (!) 116, temperature 98.2 F (36.8 C), temperature source Oral, resp. rate (!) 31, height 5\' 8"  (1.727 m), weight 57.4 kg, SpO2 90 %. PHYSICAL EXAMINATION:  Physical Exam   GENERAL:  78 y.o.-year-old patient lying in the bed with with some respiratory distress. EYES: Pupils equal, round, reactive to light.. No scleral icterus. Extraocular muscles intact.  HEENT: Head atraumatic, normocephalic. Oropharynx and nasopharynx clear.  NECK:  Supple, no jugular venous distention. No thyroid enlargement, no tenderness.  LUNGS: diminished air entry  bilaterally. CARDIOVASCULAR: S1, S2 normal. No murmurs, rubs, or gallops.  ABDOMEN: Soft, nontender, nondistended. Bowel sounds present. No organomegaly or mass.  EXTREMITIES: No pedal edema, cyanosis, or clubbing.  Generalized weakness NEUROLOGIC: Cranial nerves II through XII are intact. Muscle strength 5/5 in all extremities. Sensation intact. Gait not checked.  PSYCHIATRIC: The patient is alert and oriented x 3.  SKIN: No obvious rash, lesion, or ulcer.   LABORATORY PANEL:  Male CBC Recent Labs  Lab 06/03/18 0417  WBC 29.1*  HGB 11.7*  HCT 38.5*  PLT 182   ------------------------------------------------------------------------------------------------------------------ Chemistries  Recent Labs  Lab 05/13/18 0451 06-03-18 0417  NA  --  140  K 3.9 4.8  CL  --  87*  CO2  --  46*  GLUCOSE  --  147*  BUN  --  37*  CREATININE  --  0.62  CALCIUM  --  9.0  MG 1.9  --    RADIOLOGY:  No results found. ASSESSMENT AND PLAN:    78 year old male patient with history of COPD, lung cancer follows Dr. Daun Peacock as an outpatient per pulmonary comes in because of shortness of breath and also confusion found to have hypercapnia.  1 .Severe acute hypoxic and hypercapnic respiratory failure secondary to influenza A infection Recently improved.  Was transferred out of ICU recently. Had to be transferred back to ICU on 05/09/2018 due to worsening hypoxia Further management by intensivist in the ICU, needing as needed diuretics.  Continue oxygen to keep saturation about 88 to 92%.   Repeat chest x-ray recently with acute pneumonia involving the left lower lobe superimposed upon chronic scarring and interstitial  lung disease.  Started on IV antibiotics with IV Zosyn and tolerating well.  Continue antibiotics till February 19. Case manager was working on setting up trilogy for home use on discharge from the hospital Patient being managed conservatively at this time with plans to  ultimately transition to comfort care when patient and family agreeable Currently DNR.  Palliative care team following   2.  Metabolic encephalopathy secondary to hypercapnia; improved .Monitor  3.    COPD stage IV by gold criteria, continue IV steroids for now, taper slowly, continue duo nebs, chest physical therapy.  And morphine as needed for pain air hunger, dyspnea..  4.  Hypomagnesemia and hypophosphatemia Replaced.  5.  History of lung cancer Follow-up with his oncologist post discharge from the hospital  6.  Abnormal rhythms on telemetry Noted to have bigeminy's Likely related to strain on the heart from end-stage COPD. 2D echocardiogram done with ejection fraction of 30 to 35%.  Unable to exclude regional wall motion abnormalities. Patient seen by cardiologist.  Not a good candidate for beta-blocker due to end-stage COPD potentially could try amiodarone infusion of p.o. to decrease burden of tachycardia and ectopy.  Appreciate input from cardiologist #7. hypokalemia: Replaced. 8 metabolic alkalosis secondary to compensatory  mechanism for hypercapnia, diuretics, mild hypokalemia. Disposition; palliative care is following.  Family realizes that [name] understands that he is nearing end-of-life, critically ill, they will decide about comfort care cares.  For now continue present care.  DVT prophylaxis; Lovenox  All the records are reviewed and case discussed with Care Management/Social Worker. Management plans discussed with the patient, family and they are in agreement.  CODE STATUS: DNR/DNI   TOTAL TIME TAKING CARE OF THIS PATIENT: 56minutes.   More than 50% of the time was spent in counseling/coordination of care: YES  POSSIBLE D/C IN 2 DAYS, DEPENDING ON CLINICAL CONDITION.   Epifanio Lesches M.D on 2018-06-08 at 12:17 PM  Between 7am to 6pm - Pager - [phone]  After 6pm go to [email or web address] - [employer] [facility]   Office  [phone]  CC: Primary care physician; Leone Haven, MD  Note: This dictation was prepared with Dragon dictation along with smaller phrase technology. Any transcriptional errors that result from this process are unintentional.

## 2018-05-27 NOTE — Progress Notes (Signed)
No overt distress on HF [place]ger No new complaints  Vitals:   06/06/2018 1100 2018/06/06 1200 2018-06-06 1300 June 06, 2018 1301  BP: (!) 141/104 136/89 132/86   Pulse: 66  (!) 54   Resp: (!) 32 (!) 21 (!) 27   Temp:      TempSrc:      SpO2:  90%  (!) 88%  Weight:      Height:       HFNC 70%  Very frail Mildly tachypneic Cognition appears intact HEENT WNL No JVD Markedly diminished breath sounds throughout Very distant bronchial breath sounds and LLL No wheezes RRR, no M NABS, soft Extremities warm, no edema  BMP Latest Ref Rng & Units 06-Jun-2018 05/13/2018 05/12/2018  Glucose 70 - 99 mg/dL 147(H) - 131(H)  BUN 8 - 23 mg/dL 37(H) - 31(H)  Creatinine 0.61 - 1.24 mg/dL 0.62 - 0.78  BUN/Creat Ratio 6 - 22 (calc) - - -  Sodium 135 - 145 mmol/L 140 - 144  Potassium 3.5 - 5.1 mmol/L 4.8 3.9 3.3(L)  Chloride 98 - 111 mmol/L 87(L) - 85(L)  CO2 22 - 32 mmol/L 46(H) - 50(H)  Calcium 8.9 - 10.3 mg/dL 9.0 - 9.0   CBC Latest Ref Rng & Units 06-Jun-2018 05/12/2018 05/09/2018  WBC 4.0 - 10.5 K/uL 29.1(H) 21.5(H) 19.4(H)  Hemoglobin 13.0 - 17.0 g/dL 11.7(L) 12.0(L) 11.9(L)  Hematocrit 39.0 - 52.0 % 38.5(L) 41.3 40.3  Platelets 150 - 400 K/uL 182 206 191   CXR: NNF  IMPRESSION: End-stage COPD/emphysema  Pt of Dr Humphrey Rolls Acute on chronic hypoxemic/hypercarbic respiratory failure Influenza pneumonia LLL consolidation, possible superimposed bacterial pneumonia DNR/DNI  PLAN/REC: Continue supplemental oxygen to maintain SPO2 >88% Continue nebulized bronchodilators Add nebulized steroids Change antibiotics to levofloxacin and complete 3 more days Continue stepdown level of care until FiO2 permits transfer to MedSurg floor Palliative care service following.  Input appreciated  Merton Border, MD PCCM service Mobile [phone] Pager [phone] 06-06-2018 1:28 PM

## 2018-05-27 DEATH — deceased

## 2018-07-09 ENCOUNTER — Other Ambulatory Visit: Payer: Medicare Other

## 2018-07-09 ENCOUNTER — Ambulatory Visit: Payer: Medicare Other | Admitting: Internal Medicine

## 2018-07-09 ENCOUNTER — Ambulatory Visit: Payer: Medicare Other

## 2018-10-02 ENCOUNTER — Ambulatory Visit: Payer: Self-pay | Admitting: Internal Medicine

## 2019-01-02 IMAGING — CT CT CHEST W/O CM
2 of 3 series · 15 of 36 positions shown, 18 images · non-contrast
Comparison: Chest CT 02/09/2016.

CLINICAL DATA: 75-year-old male with history of left upper lobe
lung cancer. Followup study.

EXAM:
CT CHEST WITHOUT CONTRAST
TECHNIQUE: Multidetector CT imaging of the chest was performed following the
standard protocol without IV contrast.

[Series 2: thorax · axial · 0.76mm/px · z∈[-199,+81]mm · 12 of 166 slices shown, 15 images]
[im 13/166  mediastinal]
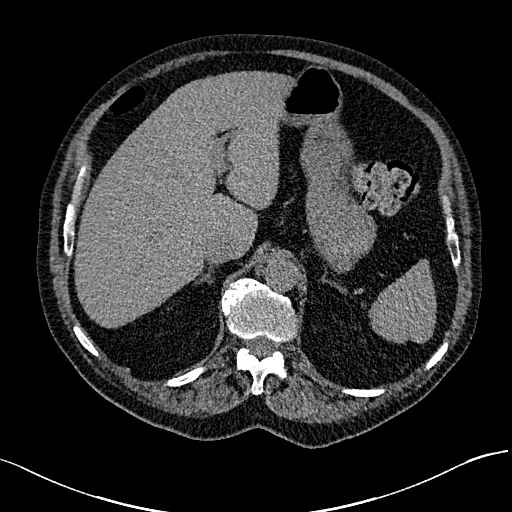
[im 13/166  lung]
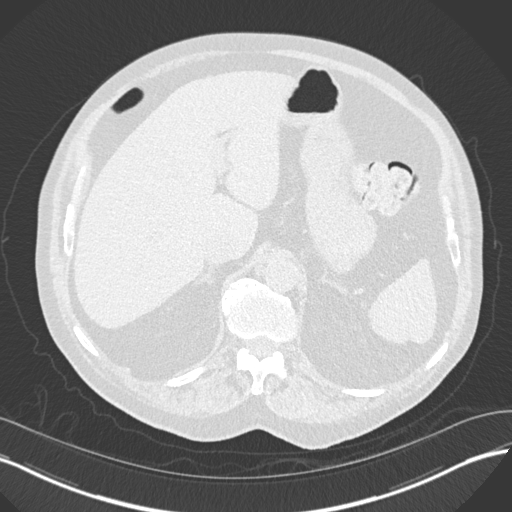
[im 25/166  lung]
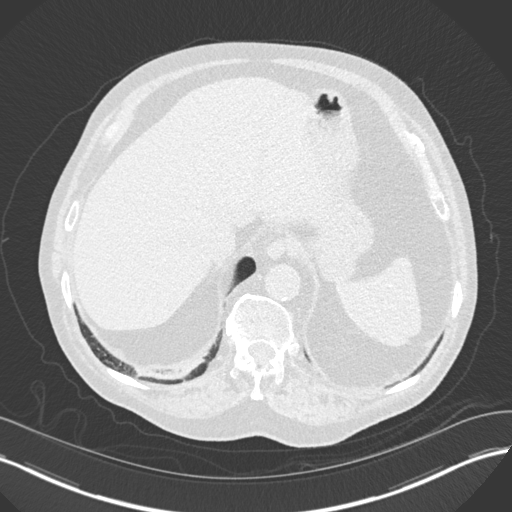
[im 37/166  lung]
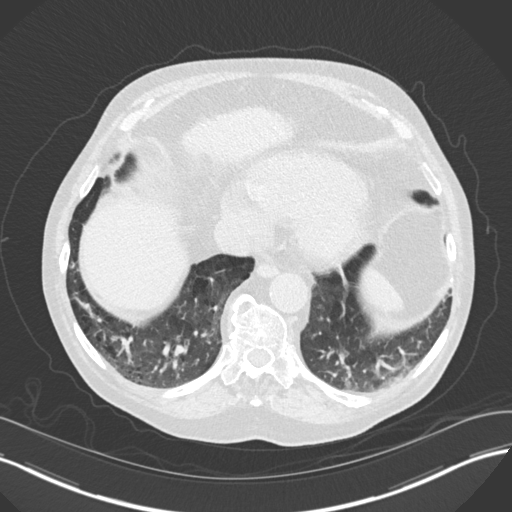
[im 49/166  lung]
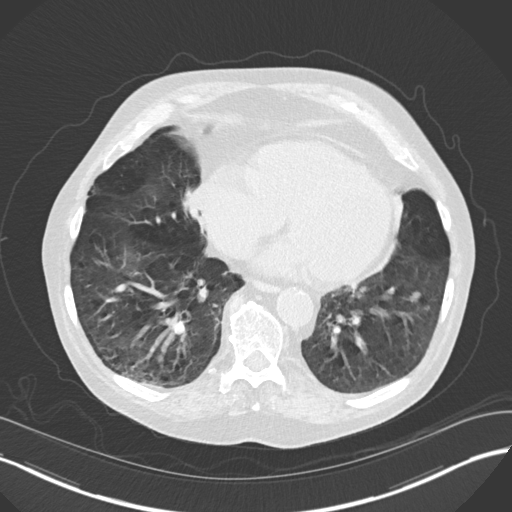
[im 62/166  mediastinal]
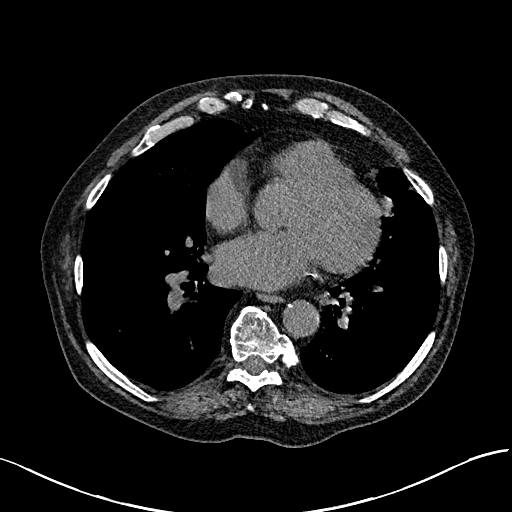
[im 62/166  lung]
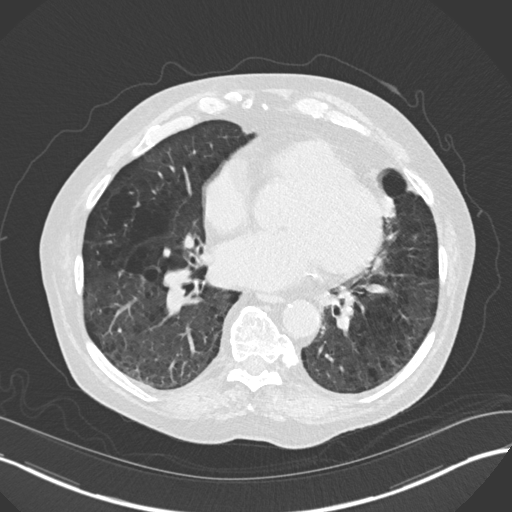
[im 74/166  lung]
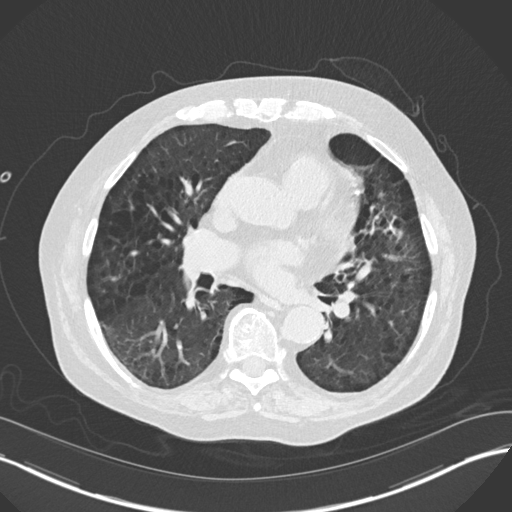
[im 92/166  lung]
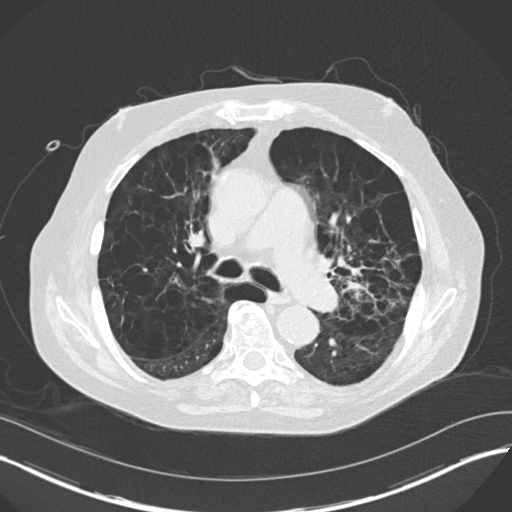
[im 104/166  lung]
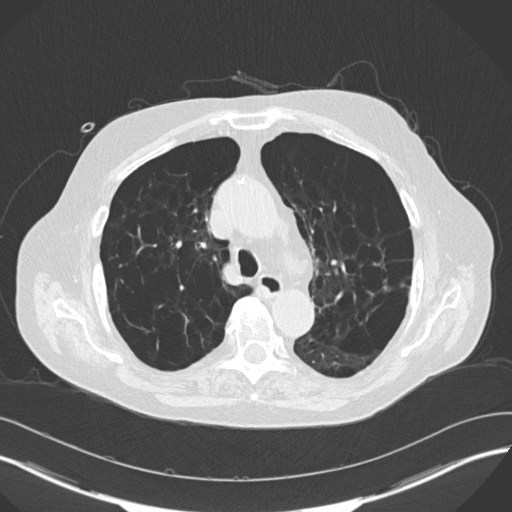
[im 117/166  mediastinal]
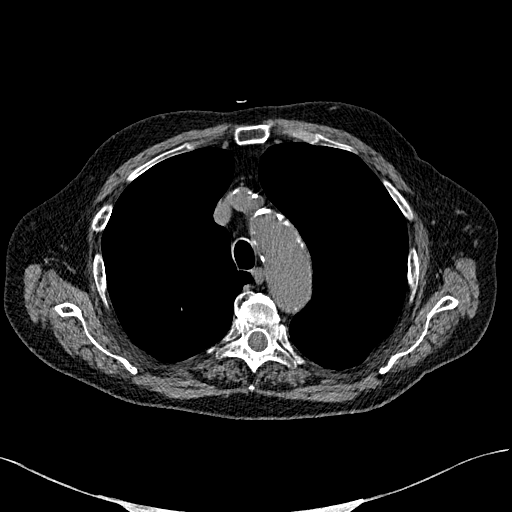
[im 117/166  lung]
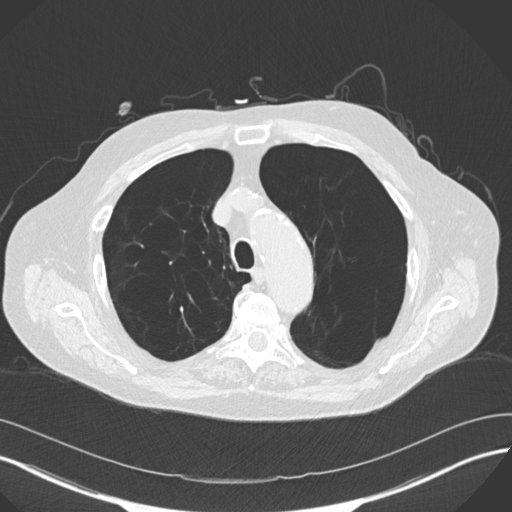
[im 129/166  lung]
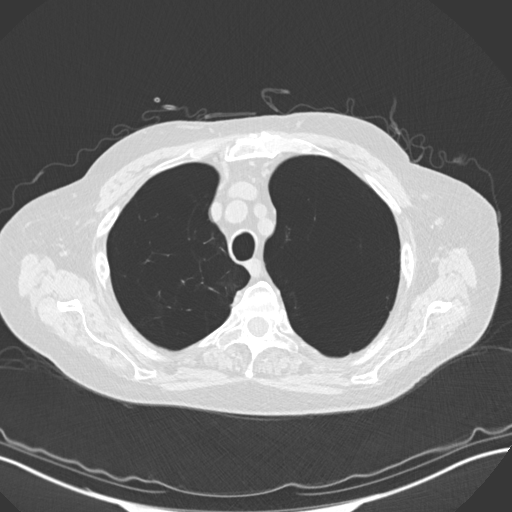
[im 141/166  lung]
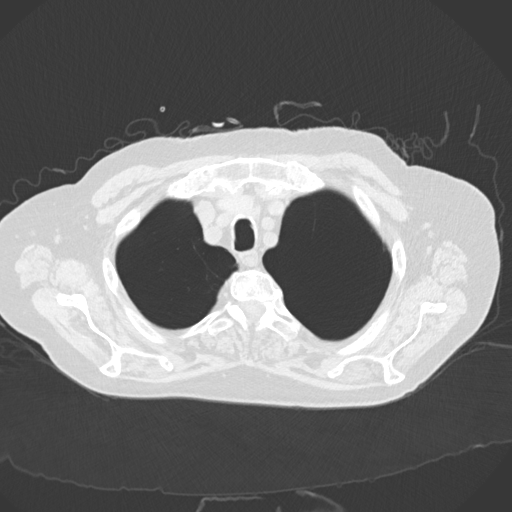
[im 153/166  lung]
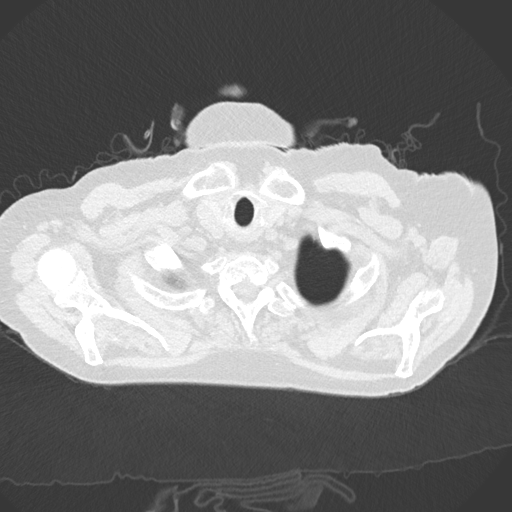

[Series 5: coronal · coronal · 0.69mm/px · 3 of 153 slices shown]
[im 31/153  lung]
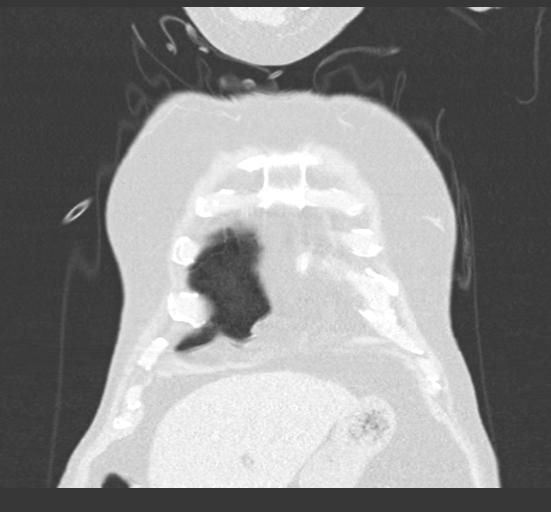
[im 61/153  lung]
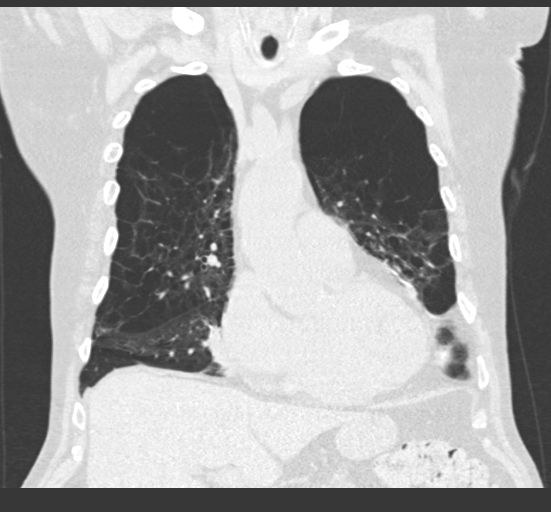
[im 92/153  lung]
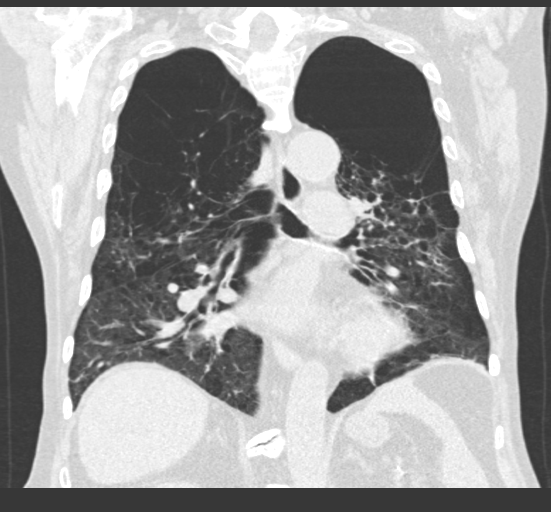

[15 of 36 positions shown; findings below may reference images not displayed]

FINDINGS: Cardiovascular: Heart size is mildly enlarged. There is no
significant pericardial fluid, thickening or pericardial
calcification. There is aortic atherosclerosis, as well as
atherosclerosis of the great vessels of the mediastinum and the
coronary arteries, including calcified atherosclerotic plaque in the
left main, left anterior descending, left circumflex and right
coronary arteries. Mild dilatation of the pulmonic trunk (3.6 cm).

Mediastinum/Nodes: No pathologically enlarged mediastinal or hilar
lymph nodes. Please note that accurate exclusion of hilar adenopathy
is limited on noncontrast CT scans. Esophagus is unremarkable in
appearance. No axillary lymphadenopathy.

Lungs/Pleura: Previously noted left upper lobe pulmonary nodule
appears slightly larger than prior studies measuring 8 x 13 mm on
today's examination (axial image 68 of series 3). The patchy
multifocal peribronchovascular airspace consolidation seen on the
prior study has resolved, indicative of an infectious/inflammatory
process on the prior examination. No acute consolidative airspace
disease or pleural effusions are noted on today's examination.
Scattered areas of bibasilar scarring are noted. Diffuse bronchial
wall thickening with severe bullous emphysema, most pronounced
throughout the upper lobes of lungs bilaterally.

Upper Abdomen: Aortic atherosclerosis.

Musculoskeletal: There are no aggressive appearing lytic or blastic
lesions noted in the visualized portions of the skeleton.
IMPRESSION: 1. Previously treated left upper lobe pulmonary nodule appears
slightly larger than prior examinations measuring 13 x 8 mm on
today's study. This may simply reflect slight differences in
respiration and the orientation of the lesion with. Respect to the
axial imaging plane close attention on followup studies is
recommended to exclude true growth.
2. Diffuse bronchial wall thickening with severe bullous emphysema;
imaging findings compatible with the reported clinical history of
COPD.
3. There is associated dilatation of the pulmonic trunk (3.6 cm in
diameter), suggestive of underlying pulmonary arterial hypertension.
4. Aortic atherosclerosis, in addition to left main and 3 vessel
coronary artery disease. Assessment for potential risk factor
modification, dietary therapy or pharmacologic therapy may be
warranted, if clinically indicated.
5. Additional incidental findings, as above.

Aortic Atherosclerosis (HJJUS-PIB.B) and Emphysema ([id number].L).
